# Patient Record
Sex: Female | Born: 1940 | Race: White | Hispanic: No | Marital: Single | Smoking: Former smoker
Health system: Southern US, Community
[De-identification: ages and names within clinical notes are randomized; demographics above are authoritative.]

## PROBLEM LIST (undated history)

## (undated) DIAGNOSIS — R112 Nausea with vomiting, unspecified: Secondary | ICD-10-CM

## (undated) DIAGNOSIS — E559 Vitamin D deficiency, unspecified: Secondary | ICD-10-CM

## (undated) DIAGNOSIS — K219 Gastro-esophageal reflux disease without esophagitis: Secondary | ICD-10-CM

## (undated) DIAGNOSIS — M1990 Unspecified osteoarthritis, unspecified site: Secondary | ICD-10-CM

## (undated) DIAGNOSIS — R002 Palpitations: Secondary | ICD-10-CM

## (undated) DIAGNOSIS — C801 Malignant (primary) neoplasm, unspecified: Secondary | ICD-10-CM

## (undated) DIAGNOSIS — Z8489 Family history of other specified conditions: Secondary | ICD-10-CM

## (undated) DIAGNOSIS — J45909 Unspecified asthma, uncomplicated: Secondary | ICD-10-CM

## (undated) DIAGNOSIS — K635 Polyp of colon: Secondary | ICD-10-CM

## (undated) DIAGNOSIS — M4800 Spinal stenosis, site unspecified: Secondary | ICD-10-CM

## (undated) DIAGNOSIS — F419 Anxiety disorder, unspecified: Secondary | ICD-10-CM

## (undated) DIAGNOSIS — T883XXA Malignant hyperthermia due to anesthesia, initial encounter: Secondary | ICD-10-CM

## (undated) DIAGNOSIS — Z9889 Other specified postprocedural states: Secondary | ICD-10-CM

## (undated) DIAGNOSIS — E039 Hypothyroidism, unspecified: Secondary | ICD-10-CM

## (undated) DIAGNOSIS — I10 Essential (primary) hypertension: Secondary | ICD-10-CM

## (undated) HISTORY — PX: COLONOSCOPY: SHX174

## (undated) HISTORY — DX: Spinal stenosis, site unspecified: M48.00

## (undated) HISTORY — PX: BREAST BIOPSY: SHX20

## (undated) HISTORY — PX: AUGMENTATION MAMMAPLASTY: SUR837

## (undated) HISTORY — DX: Polyp of colon: K63.5

## (undated) HISTORY — PX: OTHER SURGICAL HISTORY: SHX169

## (undated) HISTORY — DX: Unspecified asthma, uncomplicated: J45.909

## (undated) HISTORY — PX: CLAVICLE SURGERY: SHX598

## (undated) HISTORY — DX: Vitamin D deficiency, unspecified: E55.9

---

## 2016-04-08 ENCOUNTER — Other Ambulatory Visit: Payer: Self-pay | Admitting: Internal Medicine

## 2017-01-01 ENCOUNTER — Other Ambulatory Visit: Payer: Self-pay | Admitting: Internal Medicine

## 2017-01-01 DIAGNOSIS — Z1231 Encounter for screening mammogram for malignant neoplasm of breast: Secondary | ICD-10-CM

## 2017-01-15 ENCOUNTER — Ambulatory Visit: Payer: Self-pay

## 2017-02-10 ENCOUNTER — Ambulatory Visit
Admission: RE | Admit: 2017-02-10 | Discharge: 2017-02-10 | Disposition: A | Discharge status: Home / Self Care | Payer: BLUE CROSS/BLUE SHIELD | Source: Ambulatory Visit | Attending: Internal Medicine | Admitting: Internal Medicine

## 2017-02-10 ENCOUNTER — Ambulatory Visit: Payer: Self-pay

## 2017-02-10 DIAGNOSIS — Z1231 Encounter for screening mammogram for malignant neoplasm of breast: Secondary | ICD-10-CM

## 2017-07-25 DIAGNOSIS — M19012 Primary osteoarthritis, left shoulder: Secondary | ICD-10-CM | POA: Insufficient documentation

## 2017-11-17 NOTE — Patient Instructions (Addendum)
Emma Swanson  11/17/2017   Your procedure is scheduled on: 11/25/2017    Report to [facility] Main  Entrance  Report to admitting at    [phone]    Call this number if you have problems the morning of surgery [phone]   Remember: Do not eat food or drink liquids :After Midnight.     Take these medicines the morning of surgery with A SIP OF WATER: Nasal spray, Eye drops as usual,                                  You may not have any metal on your body including hair pins and              piercings  Do not wear jewelry, make-up, lotions, powders or perfumes, deodorant             Do not wear nail polish.  Do not shave  48 hours prior to surgery.              Do not bring valuables to the hospital. [place].  Contacts, dentures or bridgework may not be worn into surgery.  Leave suitcase in the car. After surgery it may be brought to your room.                     Please read over the following fact sheets you were given: _____________________________________________________________________             [facility] - Preparing for Surgery Before surgery, you can play an important role.  Because skin is not sterile, your skin needs to be as free of germs as possible.  You can reduce the number of germs on your skin by washing with CHG (chlorahexidine gluconate) soap before surgery.  CHG is an antiseptic cleaner which kills germs and bonds with the skin to continue killing germs even after washing. Please DO NOT use if you have an allergy to CHG or antibacterial soaps.  If your skin becomes reddened/irritated stop using the CHG and inform your nurse when you arrive at Short Stay. Do not shave (including legs and underarms) for at least 48 hours prior to the first CHG shower.  You may shave your face/neck. Please follow these instructions carefully:  1.  Shower with CHG Soap the night before surgery and the   morning of Surgery.  2.  If you choose to wash your hair, wash your hair first as usual with your  normal  shampoo.  3.  After you shampoo, rinse your hair and body thoroughly to remove the  shampoo.                           4.  Use CHG as you would any other liquid soap.  You can apply chg directly  to the skin and wash                       Gently with a scrungie or clean washcloth.  5.  Apply the CHG Soap to your body ONLY FROM THE NECK DOWN.   Do not use on face/ open  Wound or open sores. Avoid contact with eyes, ears mouth and genitals (private parts).                       Wash face,  Genitals (private parts) with your normal soap.             6.  Wash thoroughly, paying special attention to the area where your surgery  will be performed.  7.  Thoroughly rinse your body with warm water from the neck down.  8.  DO NOT shower/wash with your normal soap after using and rinsing off  the CHG Soap.                9.  Pat yourself dry with a clean towel.            10.  Wear clean pajamas.            11.  Place clean sheets on your bed the night of your first shower and do not  sleep with pets. Day of Surgery : Do not apply any lotions/deodorants the morning of surgery.  Please wear clean clothes to the hospital/surgery center.  FAILURE TO FOLLOW THESE INSTRUCTIONS MAY RESULT IN THE CANCELLATION OF YOUR SURGERY PATIENT SIGNATURE_________________________________  NURSE SIGNATURE__________________________________  ________________________________________________________________________  WHAT IS A BLOOD TRANSFUSION? Blood Transfusion Information  A transfusion is the replacement of blood or some of its parts. Blood is made up of multiple cells which provide different functions.  Red blood cells carry oxygen and are used for blood loss replacement.  White blood cells fight against infection.  Platelets control bleeding.  Plasma helps clot blood.  Other blood  products are available for specialized needs, such as hemophilia or other clotting disorders. BEFORE THE TRANSFUSION  Who gives blood for transfusions?   Healthy volunteers who are fully evaluated to make sure their blood is safe. This is blood bank blood. Transfusion therapy is the safest it has ever been in the practice of medicine. Before blood is taken from a donor, a complete history is taken to make sure that person has no history of diseases nor engages in risky social behavior (examples are intravenous drug use or sexual activity with multiple partners). The donor's travel history is screened to minimize risk of transmitting infections, such as malaria. The donated blood is tested for signs of infectious diseases, such as HIV and hepatitis. The blood is then tested to be sure it is compatible with you in order to minimize the chance of a transfusion reaction. If you or a relative donates blood, this is often done in anticipation of surgery and is not appropriate for emergency situations. It takes many days to process the donated blood. RISKS AND COMPLICATIONS Although transfusion therapy is very safe and saves many lives, the main dangers of transfusion include:   Getting an infectious disease.  Developing a transfusion reaction. This is an allergic reaction to something in the blood you were given. Every precaution is taken to prevent this. The decision to have a blood transfusion has been considered carefully by your caregiver before blood is given. Blood is not given unless the benefits outweigh the risks. AFTER THE TRANSFUSION  Right after receiving a blood transfusion, you will usually feel much better and more energetic. This is especially true if your red blood cells have gotten low (anemic). The transfusion raises the level of the red blood cells which carry oxygen, and this usually causes an energy increase.  The  nurse administering the transfusion will monitor you carefully for  complications. HOME CARE INSTRUCTIONS  No special instructions are needed after a transfusion. You may find your energy is better. Speak with your caregiver about any limitations on activity for underlying diseases you may have. SEEK MEDICAL CARE IF:   Your condition is not improving after your transfusion.  You develop redness or irritation at the intravenous (IV) site. SEEK IMMEDIATE MEDICAL CARE IF:  Any of the following symptoms occur over the next 12 hours:  Shaking chills.  You have a temperature by mouth above 102 F (38.9 C), not controlled by medicine.  Chest, back, or muscle pain.  People around you feel you are not acting correctly or are confused.  Shortness of breath or difficulty breathing.  Dizziness and fainting.  You get a rash or develop hives.  You have a decrease in urine output.  Your urine turns a dark color or changes to pink, red, or brown. Any of the following symptoms occur over the next 10 days:  You have a temperature by mouth above 102 F (38.9 C), not controlled by medicine.  Shortness of breath.  Weakness after normal activity.  The white part of the eye turns yellow (jaundice).  You have a decrease in the amount of urine or are urinating less often.  Your urine turns a dark color or changes to pink, red, or brown. Document Released: 07/05/2000 Document Revised: 09/30/2011 Document Reviewed: 02/22/2008 ExitCare Patient Information 2014 [place].  _______________________________________________________________________  Incentive Spirometer  An incentive spirometer is a tool that can help keep your lungs clear and active. This tool measures how well you are filling your lungs with each breath. Taking long deep breaths may help reverse or decrease the chance of developing breathing (pulmonary) problems (especially infection) following:  A long period of time when you are unable to move or be active. BEFORE THE PROCEDURE   If  the spirometer includes an indicator to show your best effort, your nurse or respiratory therapist will set it to a desired goal.  If possible, sit up straight or lean slightly forward. Try not to slouch.  Hold the incentive spirometer in an upright position. INSTRUCTIONS FOR USE  1. Sit on the edge of your bed if possible, or sit up as far as you can in bed or on a chair. 2. Hold the incentive spirometer in an upright position. 3. Breathe out normally. 4. Place the mouthpiece in your mouth and seal your lips tightly around it. 5. Breathe in slowly and as deeply as possible, raising the piston or the ball toward the top of the column. 6. Hold your breath for 3-5 seconds or for as long as possible. Allow the piston or ball to fall to the bottom of the column. 7. Remove the mouthpiece from your mouth and breathe out normally. 8. Rest for a few seconds and repeat Steps 1 through 7 at least 10 times every 1-2 hours when you are awake. Take your time and take a few normal breaths between deep breaths. 9. The spirometer may include an indicator to show your best effort. Use the indicator as a goal to work toward during each repetition. 10. After each set of 10 deep breaths, practice coughing to be sure your lungs are clear. If you have an incision (the cut made at the time of surgery), support your incision when coughing by placing a pillow or rolled up towels firmly against it. Once you are able to get  out of bed, walk around indoors and cough well. You may stop using the incentive spirometer when instructed by your caregiver.  RISKS AND COMPLICATIONS  Take your time so you do not get dizzy or light-headed.  If you are in pain, you may need to take or ask for pain medication before doing incentive spirometry. It is harder to take a deep breath if you are having pain. AFTER USE  Rest and breathe slowly and easily.  It can be helpful to keep track of a log of your progress. Your caregiver can  provide you with a simple table to help with this. If you are using the spirometer at home, follow these instructions: [place] IF:   You are having difficultly using the spirometer.  You have trouble using the spirometer as often as instructed.  Your pain medication is not giving enough relief while using the spirometer.  You develop fever of 100.5 F (38.1 C) or higher. SEEK IMMEDIATE MEDICAL CARE IF:   You cough up bloody sputum that had not been present before.  You develop fever of 102 F (38.9 C) or greater.  You develop worsening pain at or near the incision site. MAKE SURE YOU:   Understand these instructions.  Will watch your condition.  Will get help right away if you are not doing well or get worse. Document Released: 11/18/2006 Document Revised: 09/30/2011 Document Reviewed: 01/19/2007 [facility] Patient Information 2014 [place], [place].   ________________________________________________________________________

## 2017-11-18 ENCOUNTER — Encounter (HOSPITAL_COMMUNITY): Payer: Self-pay

## 2017-11-18 ENCOUNTER — Other Ambulatory Visit: Payer: Self-pay

## 2017-11-18 ENCOUNTER — Encounter (HOSPITAL_COMMUNITY)
Admission: RE | Admit: 2017-11-18 | Discharge: 2017-11-18 | Disposition: A | Discharge status: Home / Self Care | Payer: BLUE CROSS/BLUE SHIELD | Source: Ambulatory Visit | Attending: Orthopedic Surgery | Admitting: Orthopedic Surgery

## 2017-11-18 DIAGNOSIS — Z01818 Encounter for other preprocedural examination: Secondary | ICD-10-CM | POA: Diagnosis present

## 2017-11-18 DIAGNOSIS — R9431 Abnormal electrocardiogram [ECG] [EKG]: Secondary | ICD-10-CM | POA: Insufficient documentation

## 2017-11-18 DIAGNOSIS — M1711 Unilateral primary osteoarthritis, right knee: Secondary | ICD-10-CM | POA: Insufficient documentation

## 2017-11-18 HISTORY — DX: Unspecified osteoarthritis, unspecified site: M19.90

## 2017-11-18 HISTORY — DX: Nausea with vomiting, unspecified: R11.2

## 2017-11-18 HISTORY — DX: Malignant (primary) neoplasm, unspecified: C80.1

## 2017-11-18 HISTORY — DX: Gastro-esophageal reflux disease without esophagitis: K21.9

## 2017-11-18 HISTORY — DX: Essential (primary) hypertension: I10

## 2017-11-18 HISTORY — DX: Malignant hyperthermia due to anesthesia, initial encounter: T88.3XXA

## 2017-11-18 HISTORY — DX: Family history of other specified conditions: Z84.89

## 2017-11-18 HISTORY — DX: Other specified postprocedural states: Z98.890

## 2017-11-18 LAB — BASIC METABOLIC PANEL
ANION GAP: 10 (ref 5–15)
BUN: 16 mg/dL (ref 6–20)
CALCIUM: 9.3 mg/dL (ref 8.9–10.3)
CO2: 26 mmol/L (ref 22–32)
Chloride: 104 mmol/L (ref 101–111)
Creatinine, Ser: 0.77 mg/dL (ref 0.44–1.00)
GFR calc Af Amer: >60 mL/min (ref >60)
GLUCOSE: 91 mg/dL (ref 65–99)
POTASSIUM: 3.8 mmol/L (ref 3.5–5.1)
Sodium: 140 mmol/L (ref 135–145)

## 2017-11-18 LAB — CBC
HEMATOCRIT: 41.1 % (ref 36.0–46.0)
HEMOGLOBIN: 13.8 g/dL (ref 12.0–15.0)
MCH: 31.9 pg (ref 26.0–34.0)
MCHC: 33.6 g/dL (ref 30.0–36.0)
MCV: 94.9 fL (ref 78.0–100.0)
Platelets: 270 10*3/uL (ref 150–400)
RBC: 4.33 MIL/uL (ref 3.87–5.11)
RDW: 13.2 % (ref 11.5–15.5)
WBC: 5.6 10*3/uL (ref 4.0–10.5)

## 2017-11-18 LAB — SURGICAL PCR SCREEN
MRSA, PCR: NEGATIVE
STAPHYLOCOCCUS AUREUS: NEGATIVE

## 2017-11-18 LAB — ABO/RH: ABO/RH(D): O POS

## 2017-11-18 NOTE — Progress Notes (Signed)
Spoke with Dr Conrad Lecanto ( anesthesia)  Regarding family hx of malignant hyperthermia.  Patient had a cousin's son who almost died from malignant hyperthermia and patient's brother and his 2 sons who tested positive for  Malignant hyperthermia.  No new orders given.

## 2017-11-18 NOTE — H&P (Signed)
TOTAL KNEE ADMISSION H&P  Patient is being admitted for right total knee arthroplasty.  Subjective:  Chief Complaint:  Right knee primary OA / pain  HPI: Emma Swanson, 77 y.o. female, has a history of pain and functional disability in the right knee due to arthritis and has failed non-surgical conservative treatments for greater than 12 weeks to includeNSAID's and/or analgesics, corticosteriod injections, viscosupplementation injections and activity modification.  Onset of symptoms was gradual, starting >10 years ago with gradually worsening course since that time. The patient noted prior procedures on the knee to include  arthroscopy on the right knee(s).  Patient currently rates pain in the right knee(s) at 8 out of 10 with activity. Patient has night pain, worsening of pain with activity and weight bearing, pain that interferes with activities of daily living, pain with passive range of motion, crepitus and joint swelling.  Patient has evidence of periarticular osteophytes and joint space narrowing by imaging studies. There is no active infection.  Risks, benefits and expectations were discussed with the patient.  Risks including but not limited to the risk of anesthesia, blood clots, nerve damage, blood vessel damage, failure of the prosthesis, infection and up to and including death.  Patient understand the risks, benefits and expectations and wishes to proceed with surgery.   PCP: Sandi Mariscal, MD  D/C Plans:       Home  Post-op Meds:       No Rx given   Tranexamic Acid:      To be given - IV   Decadron:      is to be given  FYI:      ASA  Norco  DME:   Rx given for - RW   PT:   OPPT rx given    Past Medical History:  Diagnosis Date  . Arthritis   . Cancer ([place])    hx of skin cancers   . Family history of adverse reaction to anesthesia    see malignant hyperthermia tab   . GERD (gastroesophageal reflux disease)   . Hypertension   . Malignant hyperthermia    cousin's son- -  malignant hyperthermia al most died  ,  patient's brother and his 2 sons tested positive for malignant hyperthermia  . PONV (postoperative nausea and vomiting)     Past Surgical History:  Procedure Laterality Date  . AUGMENTATION MAMMAPLASTY Bilateral   . bilateral arthroscopic knee surgery     . bone spur removal     . left ankle surgery - had pins     . thyroid surgery - age 67       No current facility-administered medications for this encounter.    Current Outpatient Medications  Medication Sig Dispense Refill Last Dose  . CALCIUM PO Take 1 tablet by mouth daily.     . Cholecalciferol (VITAMIN D3) 5000 units CAPS Take 5,000 Units by mouth daily.     . clobetasol ointment (TEMOVATE) 0.25 % Apply 1 application topically at bedtime.  0   . GLUCOSAMINE-CHONDROITIN PO Take 2 tablets by mouth daily.     . hydrocortisone butyrate (LUCOID) 0.1 % CREA cream Apply 1 application topically daily as needed for rash.  0   . ipratropium (ATROVENT) 0.06 % nasal spray Place 4 sprays into both nostrils daily.     Marland Kitchen lisinopril-hydrochlorothiazide (PRINZIDE,ZESTORETIC) 10-12.5 MG tablet Take 1 tablet by mouth daily.     . Multiple Vitamin (MULTIVITAMIN WITH MINERALS) TABS tablet Take 1 tablet by mouth daily.     Marland Kitchen  naproxen sodium (ALEVE) 220 MG tablet Take 440 mg by mouth daily as needed (pain).     Marland Kitchen OVER THE COUNTER MEDICATION Apply 1 application topically daily as needed (joint pain). CBD cream     . PREVALITE 4 g packet Take 4 g by mouth 2 (two) times daily.  3   . Tetrahydrozoline HCl (VISINE OP) Place 1 drop into both eyes daily as needed (dry eyes).     . TURMERIC PO Take 1 tablet by mouth daily.     Marland Kitchen zolpidem (AMBIEN) 10 MG tablet Take 5 mg by mouth See admin instructions. Take 5 mg at bedtime and second 5 mg dose in the middle of the night      Allergies  Allergen Reactions  . Other     general anesthesia - pt's family has history of malignant hyperthermia  . Oxycodone     Hallucinations    . Penicillins     Childhood reaction Has patient had a PCN reaction causing immediate rash, facial/tongue/throat swelling, SOB or lightheadedness with hypotension: Unknown Has patient had a PCN reaction causing severe rash involving mucus membranes or skin necrosis: Unknown Has patient had a PCN reaction that required hospitalization: Unknown Has patient had a PCN reaction occurring within the last 10 years: No If all of the above answers are "NO", then may proceed with Cephalosporin use.     Social History   Tobacco Use  . Smoking status: Former Research scientist (life sciences)  . Smokeless tobacco: Never Used  . Tobacco comment: age of 47 until age 89   Substance Use Topics  . Alcohol use: Yes    Comment: 2 drinks per nite        Review of Systems  Constitutional: Negative.   HENT: Negative.   Eyes: Negative.   Respiratory: Negative.   Cardiovascular: Negative.   Gastrointestinal: Positive for diarrhea and heartburn.  Genitourinary: Negative.   Musculoskeletal: Positive for back pain and joint pain.  Skin: Negative.   Neurological: Negative.   Endo/Heme/Allergies: Negative.   Psychiatric/Behavioral: Negative.     Objective:  Physical Exam  Constitutional: She is oriented to person, place, and time. She appears well-developed.  HENT:  Head: Normocephalic.  Eyes: Pupils are equal, round, and reactive to light.  Neck: Neck supple. No JVD present. No tracheal deviation present. No thyromegaly present.  Cardiovascular: Normal rate, regular rhythm and intact distal pulses.  Respiratory: Effort normal and breath sounds normal. No respiratory distress. She has no wheezes.  GI: Soft. There is no tenderness. There is no guarding.  Musculoskeletal:       Right knee: She exhibits decreased range of motion, swelling and bony tenderness. She exhibits no ecchymosis, no deformity, no laceration and no erythema. Tenderness found.  Lymphadenopathy:    She has no cervical adenopathy.  Neurological: She is  alert and oriented to person, place, and time.  Skin: Skin is warm and dry.  Psychiatric: She has a normal mood and affect.    Vital signs in last 24 hours: Temp:  [97.6 F (36.4 C)] 97.6 F (36.4 C) (04/30 1100) Pulse Rate:  [73] 73 (04/30 1100) Resp:  [16] 16 (04/30 1100) BP: (125)/(76) 125/76 (04/30 1100) SpO2:  [100 %] 100 % (04/30 1100) Weight:  [63 kg (139 lb)] 63 kg (139 lb) (04/30 1100)  Labs:   Estimated body mass index is 22.61 kg/m as calculated from the following:   Height as of 11/18/17: 5' 5.75" (1.67 m).   Weight as of 11/18/17: 63  kg (139 lb).   Imaging Review Plain radiographs demonstrate severe degenerative joint disease of the right knee. The bone quality appears to be good for age and reported activity level.   Preoperative templating of the joint replacement has been completed, documented, and submitted to the Operating Room personnel in order to optimize intra-operative equipment management.    Patient's anticipated LOS is less than 2 midnights, meeting these requirements: - Lives within 1 hour of care - Has a competent adult at home to recover with post-op recover - NO history of  - Chronic pain requiring opiods  - Diabetes  - Coronary Artery Disease  - Heart failure  - Heart attack  - Stroke  - DVT/VTE  - Cardiac arrhythmia  - Respiratory Failure/COPD  - Renal failure  - Anemia  - Advanced Liver disease        Assessment/Plan:  End stage arthritis, right knee   The patient history, physical examination, clinical judgment of the provider and imaging studies are consistent with end stage degenerative joint disease of the right knee(s) and total knee arthroplasty is deemed medically necessary. The treatment options including medical management, injection therapy arthroscopy and arthroplasty were discussed at length. The risks and benefits of total knee arthroplasty were presented and reviewed. The risks due to aseptic loosening, infection,  stiffness, patella tracking problems, thromboembolic complications and other imponderables were discussed. The patient acknowledged the explanation, agreed to proceed with the plan and consent was signed. Patient is being admitted for inpatient treatment for surgery, pain control, PT, OT, prophylactic antibiotics, VTE prophylaxis, progressive ambulation and ADL's and discharge planning. The patient is planning to be discharged home.    [place] Pugh Brinsley Wence   PA-C  11/18/2017, 3:23 PM

## 2017-11-18 NOTE — Progress Notes (Signed)
Final EKG done 11/18/17-epic

## 2017-11-24 MED ORDER — SODIUM CHLORIDE 0.9 % IV SOLN
1000.0000 mg | INTRAVENOUS | Status: AC
  Administered 2017-11-25: 1000 mg via INTRAVENOUS
  Filled 2017-11-24: qty 1100

## 2017-11-25 ENCOUNTER — Observation Stay (HOSPITAL_COMMUNITY)
Admission: RE | Admit: 2017-11-25 | Discharge: 2017-11-26 | Disposition: A | Discharge status: Home / Self Care | Payer: BLUE CROSS/BLUE SHIELD | Source: Ambulatory Visit | Attending: Orthopedic Surgery | Admitting: Orthopedic Surgery

## 2017-11-25 ENCOUNTER — Encounter (HOSPITAL_COMMUNITY)
Admission: RE | Disposition: A | Discharge status: Home / Self Care | Payer: Self-pay | Source: Ambulatory Visit | Attending: Orthopedic Surgery

## 2017-11-25 ENCOUNTER — Encounter (HOSPITAL_COMMUNITY): Payer: Self-pay | Admitting: Emergency Medicine

## 2017-11-25 ENCOUNTER — Inpatient Hospital Stay (HOSPITAL_COMMUNITY): Payer: BLUE CROSS/BLUE SHIELD | Admitting: Anesthesiology

## 2017-11-25 ENCOUNTER — Other Ambulatory Visit: Payer: Self-pay

## 2017-11-25 DIAGNOSIS — I10 Essential (primary) hypertension: Secondary | ICD-10-CM | POA: Diagnosis not present

## 2017-11-25 DIAGNOSIS — Z87891 Personal history of nicotine dependence: Secondary | ICD-10-CM | POA: Insufficient documentation

## 2017-11-25 DIAGNOSIS — M659 Synovitis and tenosynovitis, unspecified: Secondary | ICD-10-CM | POA: Diagnosis not present

## 2017-11-25 DIAGNOSIS — Z79899 Other long term (current) drug therapy: Secondary | ICD-10-CM | POA: Diagnosis not present

## 2017-11-25 DIAGNOSIS — M1711 Unilateral primary osteoarthritis, right knee: Secondary | ICD-10-CM | POA: Diagnosis present

## 2017-11-25 DIAGNOSIS — Z96651 Presence of right artificial knee joint: Secondary | ICD-10-CM

## 2017-11-25 DIAGNOSIS — M25761 Osteophyte, right knee: Secondary | ICD-10-CM | POA: Insufficient documentation

## 2017-11-25 DIAGNOSIS — Z85828 Personal history of other malignant neoplasm of skin: Secondary | ICD-10-CM | POA: Diagnosis not present

## 2017-11-25 DIAGNOSIS — Z96659 Presence of unspecified artificial knee joint: Secondary | ICD-10-CM

## 2017-11-25 HISTORY — PX: TOTAL KNEE ARTHROPLASTY: SHX125

## 2017-11-25 LAB — TYPE AND SCREEN
ABO/RH(D): O POS
Antibody Screen: NEGATIVE

## 2017-11-25 LAB — GLUCOSE, CAPILLARY: Glucose-Capillary: 146 mg/dL — ABNORMAL HIGH (ref 65–99)

## 2017-11-25 SURGERY — ARTHROPLASTY, KNEE, TOTAL
Anesthesia: Spinal | Site: Knee | Laterality: Right

## 2017-11-25 MED ORDER — ASPIRIN 81 MG PO CHEW
81.0000 mg | CHEWABLE_TABLET | Freq: Two times a day (BID) | ORAL | Status: DC
  Administered 2017-11-25 – 2017-11-26 (×2): 81 mg via ORAL
  Filled 2017-11-25 (×2): qty 1

## 2017-11-25 MED ORDER — HYDROCODONE-ACETAMINOPHEN 7.5-325 MG PO TABS: 1.0000 | ORAL_TABLET | ORAL | 0 refills | Status: DC | PRN

## 2017-11-25 MED ORDER — METHOCARBAMOL 500 MG PO TABS: 500.0000 mg | ORAL_TABLET | Freq: Four times a day (QID) | ORAL | 0 refills | Status: DC | PRN

## 2017-11-25 MED ORDER — CHLORHEXIDINE GLUCONATE 4 % EX LIQD: 60.0000 mL | Freq: Once | CUTANEOUS | Status: DC

## 2017-11-25 MED ORDER — SODIUM CHLORIDE 0.9 % IJ SOLN
INTRAMUSCULAR | Status: DC | PRN
  Administered 2017-11-25: 30 mL

## 2017-11-25 MED ORDER — HYDROMORPHONE HCL 1 MG/ML IJ SOLN
INTRAMUSCULAR | Status: AC
  Filled 2017-11-25: qty 1

## 2017-11-25 MED ORDER — LIDOCAINE 2% (20 MG/ML) 5 ML SYRINGE
INTRAMUSCULAR | Status: AC
  Filled 2017-11-25: qty 5

## 2017-11-25 MED ORDER — ALUM & MAG HYDROXIDE-SIMETH 200-200-20 MG/5ML PO SUSP: 15.0000 mL | ORAL | Status: DC | PRN

## 2017-11-25 MED ORDER — PROPOFOL 10 MG/ML IV BOLUS
INTRAVENOUS | Status: DC | PRN
  Administered 2017-11-25: 20 mg via INTRAVENOUS
  Administered 2017-11-25: 150 mg via INTRAVENOUS
  Administered 2017-11-25: 30 mg via INTRAVENOUS

## 2017-11-25 MED ORDER — FENTANYL CITRATE (PF) 100 MCG/2ML IJ SOLN
INTRAMUSCULAR | Status: AC
  Filled 2017-11-25: qty 2

## 2017-11-25 MED ORDER — 0.9 % SODIUM CHLORIDE (POUR BTL) OPTIME
TOPICAL | Status: DC | PRN
  Administered 2017-11-25: 1000 mL

## 2017-11-25 MED ORDER — METHOCARBAMOL 1000 MG/10ML IJ SOLN
500.0000 mg | Freq: Four times a day (QID) | INTRAVENOUS | Status: DC | PRN
  Administered 2017-11-25: 500 mg via INTRAVENOUS
  Filled 2017-11-25: qty 550

## 2017-11-25 MED ORDER — PROMETHAZINE HCL 25 MG/ML IJ SOLN
INTRAMUSCULAR | Status: AC
  Filled 2017-11-25: qty 1

## 2017-11-25 MED ORDER — BUPIVACAINE HCL (PF) 0.25 % IJ SOLN
INTRAMUSCULAR | Status: DC | PRN
  Administered 2017-11-25: 30 mL

## 2017-11-25 MED ORDER — CEFAZOLIN SODIUM-DEXTROSE 2-4 GM/100ML-% IV SOLN
2.0000 g | INTRAVENOUS | Status: AC
  Administered 2017-11-25: 2 g via INTRAVENOUS
  Filled 2017-11-25: qty 100

## 2017-11-25 MED ORDER — MIDAZOLAM HCL 2 MG/2ML IJ SOLN
INTRAMUSCULAR | Status: AC
  Filled 2017-11-25: qty 2

## 2017-11-25 MED ORDER — PROPOFOL 500 MG/50ML IV EMUL
INTRAVENOUS | Status: DC | PRN
  Administered 2017-11-25: 125 ug/kg/min via INTRAVENOUS

## 2017-11-25 MED ORDER — ZOLPIDEM TARTRATE 5 MG PO TABS
5.0000 mg | ORAL_TABLET | Freq: Every day | ORAL | Status: DC
  Filled 2017-11-25: qty 1

## 2017-11-25 MED ORDER — DOCUSATE SODIUM 100 MG PO CAPS
100.0000 mg | ORAL_CAPSULE | Freq: Two times a day (BID) | ORAL | Status: DC
  Filled 2017-11-25: qty 1

## 2017-11-25 MED ORDER — FENTANYL CITRATE (PF) 100 MCG/2ML IJ SOLN
50.0000 ug | INTRAMUSCULAR | Status: DC
  Administered 2017-11-25 (×2): 50 ug via INTRAVENOUS
  Filled 2017-11-25: qty 2

## 2017-11-25 MED ORDER — ONDANSETRON HCL 4 MG/2ML IJ SOLN
INTRAMUSCULAR | Status: DC | PRN
  Administered 2017-11-25: 4 mg via INTRAVENOUS

## 2017-11-25 MED ORDER — DOCUSATE SODIUM 100 MG PO CAPS: 100.0000 mg | ORAL_CAPSULE | Freq: Two times a day (BID) | ORAL | 0 refills | Status: DC

## 2017-11-25 MED ORDER — DEXAMETHASONE SODIUM PHOSPHATE 10 MG/ML IJ SOLN
10.0000 mg | Freq: Once | INTRAMUSCULAR | Status: AC
  Administered 2017-11-25: 10 mg via INTRAVENOUS

## 2017-11-25 MED ORDER — POLYETHYLENE GLYCOL 3350 17 G PO PACK: 17.0000 g | PACK | Freq: Two times a day (BID) | ORAL | 0 refills | Status: DC

## 2017-11-25 MED ORDER — ASPIRIN 81 MG PO CHEW: 81.0000 mg | CHEWABLE_TABLET | Freq: Two times a day (BID) | ORAL | 0 refills | Status: AC

## 2017-11-25 MED ORDER — LACTATED RINGERS IV SOLN
INTRAVENOUS | Status: DC
  Administered 2017-11-25 (×2): via INTRAVENOUS

## 2017-11-25 MED ORDER — HYDROMORPHONE HCL 1 MG/ML IJ SOLN
0.2500 mg | INTRAMUSCULAR | Status: DC | PRN
  Administered 2017-11-25 (×4): 0.5 mg via INTRAVENOUS

## 2017-11-25 MED ORDER — MAGNESIUM CITRATE PO SOLN: 1.0000 | Freq: Once | ORAL | Status: DC | PRN

## 2017-11-25 MED ORDER — KETOROLAC TROMETHAMINE 30 MG/ML IJ SOLN
INTRAMUSCULAR | Status: DC | PRN
  Administered 2017-11-25: 30 mg

## 2017-11-25 MED ORDER — ONDANSETRON HCL 4 MG PO TABS: 4.0000 mg | ORAL_TABLET | Freq: Four times a day (QID) | ORAL | Status: DC | PRN

## 2017-11-25 MED ORDER — ACETAMINOPHEN 325 MG PO TABS: 325.0000 mg | ORAL_TABLET | Freq: Four times a day (QID) | ORAL | Status: DC | PRN

## 2017-11-25 MED ORDER — IPRATROPIUM BROMIDE 0.06 % NA SOLN
4.0000 | Freq: Every day | NASAL | Status: DC
  Filled 2017-11-25: qty 15

## 2017-11-25 MED ORDER — PHENOL 1.4 % MT LIQD: 1.0000 | OROMUCOSAL | Status: DC | PRN

## 2017-11-25 MED ORDER — METHOCARBAMOL 500 MG PO TABS
500.0000 mg | ORAL_TABLET | Freq: Four times a day (QID) | ORAL | Status: DC | PRN
  Administered 2017-11-25 – 2017-11-26 (×2): 500 mg via ORAL
  Filled 2017-11-25 (×2): qty 1

## 2017-11-25 MED ORDER — ONDANSETRON HCL 4 MG/2ML IJ SOLN
INTRAMUSCULAR | Status: AC
  Filled 2017-11-25: qty 2

## 2017-11-25 MED ORDER — POLYETHYLENE GLYCOL 3350 17 G PO PACK: 17.0000 g | PACK | Freq: Two times a day (BID) | ORAL | Status: DC

## 2017-11-25 MED ORDER — PROPOFOL 10 MG/ML IV BOLUS
INTRAVENOUS | Status: AC
  Filled 2017-11-25: qty 40

## 2017-11-25 MED ORDER — MORPHINE SULFATE (PF) 2 MG/ML IV SOLN
0.5000 mg | INTRAVENOUS | Status: DC | PRN
  Administered 2017-11-25: 1 mg via INTRAVENOUS
  Filled 2017-11-25: qty 1

## 2017-11-25 MED ORDER — SODIUM CHLORIDE 0.9 % IV SOLN
INTRAVENOUS | Status: DC
  Administered 2017-11-25: 16:00:00 via INTRAVENOUS

## 2017-11-25 MED ORDER — FERROUS SULFATE 325 (65 FE) MG PO TABS: 325.0000 mg | ORAL_TABLET | Freq: Three times a day (TID) | ORAL | 3 refills | Status: DC

## 2017-11-25 MED ORDER — PROMETHAZINE HCL 25 MG/ML IJ SOLN
6.2500 mg | INTRAMUSCULAR | Status: DC | PRN
  Administered 2017-11-25: 6.25 mg via INTRAVENOUS

## 2017-11-25 MED ORDER — PROPOFOL 10 MG/ML IV BOLUS
INTRAVENOUS | Status: AC
  Filled 2017-11-25: qty 20

## 2017-11-25 MED ORDER — SODIUM CHLORIDE 0.9 % IJ SOLN
INTRAMUSCULAR | Status: AC
  Filled 2017-11-25: qty 50

## 2017-11-25 MED ORDER — MENTHOL 3 MG MT LOZG: 1.0000 | LOZENGE | OROMUCOSAL | Status: DC | PRN

## 2017-11-25 MED ORDER — METOCLOPRAMIDE HCL 5 MG PO TABS: 5.0000 mg | ORAL_TABLET | Freq: Three times a day (TID) | ORAL | Status: DC | PRN

## 2017-11-25 MED ORDER — HYDROCODONE-ACETAMINOPHEN 5-325 MG PO TABS
1.0000 | ORAL_TABLET | ORAL | Status: DC | PRN
  Administered 2017-11-26: 2 via ORAL
  Filled 2017-11-25: qty 2

## 2017-11-25 MED ORDER — CEFAZOLIN SODIUM-DEXTROSE 2-4 GM/100ML-% IV SOLN
2.0000 g | Freq: Four times a day (QID) | INTRAVENOUS | Status: AC
  Administered 2017-11-25 (×2): 2 g via INTRAVENOUS
  Filled 2017-11-25 (×2): qty 100

## 2017-11-25 MED ORDER — LIDOCAINE 2% (20 MG/ML) 5 ML SYRINGE
INTRAMUSCULAR | Status: DC | PRN
  Administered 2017-11-25: 100 mg via INTRAVENOUS

## 2017-11-25 MED ORDER — METOCLOPRAMIDE HCL 5 MG/ML IJ SOLN: 5.0000 mg | Freq: Three times a day (TID) | INTRAMUSCULAR | Status: DC | PRN

## 2017-11-25 MED ORDER — DIPHENHYDRAMINE HCL 12.5 MG/5ML PO ELIX: 12.5000 mg | ORAL_SOLUTION | ORAL | Status: DC | PRN

## 2017-11-25 MED ORDER — MIDAZOLAM HCL 2 MG/2ML IJ SOLN
1.0000 mg | INTRAMUSCULAR | Status: DC
  Administered 2017-11-25 (×2): 1 mg via INTRAVENOUS
  Filled 2017-11-25: qty 2

## 2017-11-25 MED ORDER — HYDROCODONE-ACETAMINOPHEN 7.5-325 MG PO TABS
1.0000 | ORAL_TABLET | ORAL | Status: DC | PRN
  Administered 2017-11-25: 1 via ORAL
  Administered 2017-11-26: 2 via ORAL
  Filled 2017-11-25: qty 2
  Filled 2017-11-25: qty 1

## 2017-11-25 MED ORDER — FENTANYL CITRATE (PF) 100 MCG/2ML IJ SOLN
INTRAMUSCULAR | Status: DC | PRN
  Administered 2017-11-25 (×3): 50 ug via INTRAVENOUS

## 2017-11-25 MED ORDER — ONDANSETRON HCL 4 MG/2ML IJ SOLN: 4.0000 mg | Freq: Four times a day (QID) | INTRAMUSCULAR | Status: DC | PRN

## 2017-11-25 MED ORDER — STERILE WATER FOR IRRIGATION IR SOLN
Status: DC | PRN
  Administered 2017-11-25: 2000 mL

## 2017-11-25 MED ORDER — FERROUS SULFATE 325 (65 FE) MG PO TABS: 325.0000 mg | ORAL_TABLET | Freq: Three times a day (TID) | ORAL | Status: DC

## 2017-11-25 MED ORDER — SODIUM CHLORIDE 0.9 % IR SOLN
Status: DC | PRN
  Administered 2017-11-25: 1000 mL

## 2017-11-25 MED ORDER — BISACODYL 10 MG RE SUPP: 10.0000 mg | Freq: Every day | RECTAL | Status: DC | PRN

## 2017-11-25 MED ORDER — DEXAMETHASONE SODIUM PHOSPHATE 10 MG/ML IJ SOLN
10.0000 mg | Freq: Once | INTRAMUSCULAR | Status: AC
  Administered 2017-11-26: 10 mg via INTRAVENOUS
  Filled 2017-11-25: qty 1

## 2017-11-25 MED ORDER — BUPIVACAINE HCL (PF) 0.25 % IJ SOLN
INTRAMUSCULAR | Status: AC
  Filled 2017-11-25: qty 30

## 2017-11-25 MED ORDER — DEXAMETHASONE SODIUM PHOSPHATE 10 MG/ML IJ SOLN
INTRAMUSCULAR | Status: AC
  Filled 2017-11-25: qty 1

## 2017-11-25 MED ORDER — ZOLPIDEM TARTRATE 5 MG PO TABS
5.0000 mg | ORAL_TABLET | Freq: Every evening | ORAL | Status: DC | PRN
  Administered 2017-11-25: 5 mg via ORAL

## 2017-11-25 MED ORDER — ROPIVACAINE HCL 7.5 MG/ML IJ SOLN
INTRAMUSCULAR | Status: DC | PRN
  Administered 2017-11-25: 20 mL via PERINEURAL

## 2017-11-25 MED ORDER — CHOLESTYRAMINE LIGHT 4 G PO PACK
4.0000 g | PACK | Freq: Two times a day (BID) | ORAL | Status: DC
  Administered 2017-11-25 – 2017-11-26 (×2): 4 g via ORAL
  Filled 2017-11-25 (×2): qty 1

## 2017-11-25 MED ORDER — KETOROLAC TROMETHAMINE 30 MG/ML IJ SOLN
INTRAMUSCULAR | Status: AC
  Filled 2017-11-25: qty 1

## 2017-11-25 MED ORDER — CELECOXIB 200 MG PO CAPS
200.0000 mg | ORAL_CAPSULE | Freq: Two times a day (BID) | ORAL | Status: DC
  Administered 2017-11-25 – 2017-11-26 (×2): 200 mg via ORAL
  Filled 2017-11-25 (×3): qty 1

## 2017-11-25 MED ORDER — TRANEXAMIC ACID 1000 MG/10ML IV SOLN
1000.0000 mg | Freq: Once | INTRAVENOUS | Status: AC
  Administered 2017-11-25: 1000 mg via INTRAVENOUS
  Filled 2017-11-25: qty 1100

## 2017-11-25 MED ORDER — MIDAZOLAM HCL 5 MG/5ML IJ SOLN
INTRAMUSCULAR | Status: DC | PRN
  Administered 2017-11-25: 2 mg via INTRAVENOUS

## 2017-11-25 SURGICAL SUPPLY — 55 items
BAG DECANTER FOR FLEXI CONT (MISCELLANEOUS) IMPLANT
BAG ZIPLOCK 12X15 (MISCELLANEOUS) IMPLANT
BANDAGE ACE 6X5 VEL STRL LF (GAUZE/BANDAGES/DRESSINGS) ×2 IMPLANT
BLADE SAW SGTL 11.0X1.19X90.0M (BLADE) ×2 IMPLANT
BLADE SAW SGTL 13.0X1.19X90.0M (BLADE) ×2 IMPLANT
BOWL SMART MIX CTS (DISPOSABLE) ×2 IMPLANT
CAPT KNEE TOTAL 3 ATTUNE ×2 IMPLANT
CEMENT HV SMART SET (Cement) ×4 IMPLANT
COVER SURGICAL LIGHT HANDLE (MISCELLANEOUS) ×2 IMPLANT
CUFF TOURN SGL QUICK 34 (TOURNIQUET CUFF) ×1
CUFF TRNQT CYL 34X4X40X1 (TOURNIQUET CUFF) ×1 IMPLANT
DECANTER SPIKE VIAL GLASS SM (MISCELLANEOUS) ×2 IMPLANT
DERMABOND ADVANCED (GAUZE/BANDAGES/DRESSINGS) ×1
DERMABOND ADVANCED .7 DNX12 (GAUZE/BANDAGES/DRESSINGS) ×1 IMPLANT
DRAPE TOP 10253 STERILE (DRAPES) IMPLANT
DRAPE U-SHAPE 47X51 STRL (DRAPES) ×2 IMPLANT
DRESSING AQUACEL AG SP 3.5X10 (GAUZE/BANDAGES/DRESSINGS) ×1 IMPLANT
DRSG AQUACEL AG SP 3.5X10 (GAUZE/BANDAGES/DRESSINGS) ×2
DURAPREP 26ML APPLICATOR (WOUND CARE) ×4 IMPLANT
ELECT REM PT RETURN 15FT ADLT (MISCELLANEOUS) ×2 IMPLANT
GLOVE BIOGEL M 7.0 STRL (GLOVE) IMPLANT
GLOVE BIOGEL M STRL SZ7.5 (GLOVE) ×2 IMPLANT
GLOVE BIOGEL PI IND STRL 7.0 (GLOVE) ×1 IMPLANT
GLOVE BIOGEL PI IND STRL 7.5 (GLOVE) ×5 IMPLANT
GLOVE BIOGEL PI IND STRL 8.5 (GLOVE) IMPLANT
GLOVE BIOGEL PI IND STRL 9 (GLOVE) ×2 IMPLANT
GLOVE BIOGEL PI INDICATOR 7.0 (GLOVE) ×1
GLOVE BIOGEL PI INDICATOR 7.5 (GLOVE) ×5
GLOVE BIOGEL PI INDICATOR 8.5 (GLOVE)
GLOVE BIOGEL PI INDICATOR 9 (GLOVE) ×2
GLOVE ECLIPSE 8.0 STRL XLNG CF (GLOVE) IMPLANT
GLOVE ECLIPSE 8.5 STRL (GLOVE) ×2 IMPLANT
GLOVE ORTHO TXT STRL SZ7.5 (GLOVE) ×4 IMPLANT
GOWN STRL REUS W/TWL 2XL LVL3 (GOWN DISPOSABLE) ×2 IMPLANT
GOWN STRL REUS W/TWL LRG LVL3 (GOWN DISPOSABLE) ×2 IMPLANT
GOWN STRL REUS W/TWL XL LVL4 (GOWN DISPOSABLE) ×6 IMPLANT
HANDPIECE INTERPULSE COAX TIP (DISPOSABLE) ×1
MANIFOLD NEPTUNE II (INSTRUMENTS) ×2 IMPLANT
PACK TOTAL KNEE CUSTOM (KITS) ×2 IMPLANT
POSITIONER SURGICAL ARM (MISCELLANEOUS) ×2 IMPLANT
SET HNDPC FAN SPRY TIP SCT (DISPOSABLE) ×1 IMPLANT
SET PAD KNEE POSITIONER (MISCELLANEOUS) ×2 IMPLANT
SUT MNCRL AB 4-0 PS2 18 (SUTURE) ×2 IMPLANT
SUT STRATAFIX 0 PDS 27 VIOLET (SUTURE) ×2
SUT STRATAFIX PDS+ 0 24IN (SUTURE) IMPLANT
SUT VIC AB 1 CT1 36 (SUTURE) ×2 IMPLANT
SUT VIC AB 2-0 CT1 27 (SUTURE) ×3
SUT VIC AB 2-0 CT1 TAPERPNT 27 (SUTURE) ×3 IMPLANT
SUTURE STRATFX 0 PDS 27 VIOLET (SUTURE) ×1 IMPLANT
SYR 50ML LL SCALE MARK (SYRINGE) IMPLANT
TRAY FOLEY CATH 14FRSI W/METER (CATHETERS) ×2 IMPLANT
TRAY FOLEY W/METER SILVER 16FR (SET/KITS/TRAYS/PACK) IMPLANT
WATER STERILE IRR 1000ML POUR (IV SOLUTION) ×2 IMPLANT
WRAP KNEE MAXI GEL POST OP (GAUZE/BANDAGES/DRESSINGS) ×2 IMPLANT
YANKAUER SUCT BULB TIP 10FT TU (MISCELLANEOUS) ×2 IMPLANT

## 2017-11-25 NOTE — Progress Notes (Signed)
Dr. Kalman Shan notified of plastic bracelet on R wrist that cannot be removed.

## 2017-11-25 NOTE — Discharge Instructions (Signed)

## 2017-11-25 NOTE — Transfer of Care (Signed)
Immediate Anesthesia Transfer of Care Note  Patient: Emma Swanson  Procedure(s) Performed: RIGHT TOTAL KNEE ARTHROPLASTY (Right Knee)  Patient Location: PACU  Anesthesia Type:General and Regional  Level of Consciousness: sedated  Airway & Oxygen Therapy: Patient Spontanous Breathing and Patient connected to face mask oxygen  Post-op Assessment: Report given to RN and Post -op Vital signs reviewed and stable  Post vital signs: Reviewed and stable  Last Vitals:  Vitals Value Taken Time  BP 120/76 11/25/2017 10:22 AM  Temp    Pulse 65 11/25/2017 10:22 AM  Resp 12 11/25/2017 10:22 AM  SpO2 100 % 11/25/2017 10:22 AM  Vitals shown include unvalidated device data.  Last Pain:  Vitals:   11/25/17 0655  TempSrc:   PainSc: 0-No pain      Patients Stated Pain Goal: 4 (62/37/62 [id number])  Complications: No apparent anesthesia complications

## 2017-11-25 NOTE — Anesthesia Procedure Notes (Signed)
Anesthesia Procedure Image    

## 2017-11-25 NOTE — Op Note (Signed)
NAME:  Emma Swanson                      MEDICAL RECORD NO.:  [id number]                             FACILITY:  [facility]      PHYSICIAN:  Pietro Cassis. Alvan Dame, M.D.  DATE OF BIRTH:  22-Jun-1941      DATE OF PROCEDURE:  11/25/2017                                     OPERATIVE REPORT         PREOPERATIVE DIAGNOSIS:  Right knee osteoarthritis.      POSTOPERATIVE DIAGNOSIS:  Right knee osteoarthritis.      FINDINGS:  The patient was noted to have complete loss of cartilage and   bone-on-bone arthritis with associated osteophytes in the lateral and patellofemoral compartments of   the knee with a significant synovitis and associated effusion.      PROCEDURE:  Right total knee replacement.      COMPONENTS USED:  DePuy Attune rotating platform posterior stabilized knee   system, a size 5N femur, 4 tibia, size 6 mm PS AOX insert, and 35 anatomic patellar   button.      SURGEON:  Pietro Cassis. Alvan Dame, M.D.      ASSISTANT:  Nehemiah Massed, PA-C.      ANESTHESIA:  General and Regional.      SPECIMENS:  None.      COMPLICATION:  None.      DRAINS:  None.  EBL: <100cc      TOURNIQUET TIME:   Total Tourniquet Time Documented: Thigh (Right) - 32 minutes Total: Thigh (Right) - 32 minutes  .      The patient was stable to the recovery room.      INDICATION FOR PROCEDURE:  Emma Swanson is a 77 y.o. female patient of   mine.  The patient had been seen, evaluated, and treated conservatively in the   office with medication, activity modification, and injections.  The patient had   radiographic changes of bone-on-bone arthritis with endplate sclerosis and osteophytes noted.      The patient failed conservative measures including medication, injections, and activity modification, and at this point was ready for more definitive measures.   Based on the radiographic changes and failed conservative measures, the patient   decided to proceed with total knee replacement.  Risks of infection,   DVT,  component failure, need for revision surgery, postop course, and   expectations were all   discussed and reviewed.  Consent was obtained for benefit of pain   relief.      PROCEDURE IN DETAIL:  The patient was brought to the operative theater.   Once adequate anesthesia, preoperative antibiotics, 2 gm of Ancef, 1 gm of Tranexamic Acid, and 10 mg of Decadron administered, the patient was positioned supine with the right thigh tourniquet placed.  The  right lower extremity was prepped and draped in sterile fashion.  A time-   out was performed identifying the patient, planned procedure, and   extremity.      The right lower extremity was placed in the [facility] leg holder.  The leg was   exsanguinated, tourniquet elevated to 250 mmHg.  A midline incision was   made  followed by median parapatellar arthrotomy.  Following initial   exposure, attention was first directed to the patella.  Precut   measurement was noted to be 22 mm.  I resected down to 14 mm and used a   35 anatromic patellar button to restore patellar height as well as cover the cut   surface.      The lug holes were drilled and a metal shim was placed to protect the   patella from retractors and saw blades.      At this point, attention was now directed to the femur.  The femoral   canal was opened with a drill, irrigated to try to prevent fat emboli.  An   intramedullary rod was passed at 3 degrees valgus, 9 mm of bone was   resected off the distal femur.  Following this resection, the tibia was   subluxated anteriorly.  Using the extramedullary guide, 4 mm of bone was resected off   the proximal lateral tibia.  We confirmed the gap would be   stable medially and laterally with a 10 mm insert as well as confirmed   the cut was perpendicular in the coronal plane, checking with an alignment rod.      Once this was done, I sized the femur to be a size 5 in the anterior-   posterior dimension, chose a narrow component based on  medial and   lateral dimension.  The size 5 rotation block was then pinned in   position anterior referenced using the C-clamp to set rotation.  The   anterior, posterior, and  chamfer cuts were made without difficulty nor   notching making certain that I was along the anterior cortex to help   with flexion gap stability.      The final box cut was made off the lateral aspect of distal femur.      At this point, the tibia was sized to be a size 4, the size 4 tray was   then pinned in position through the medial third of the tubercle,   drilled, and keel punched.  Trial reduction was now carried with a 5 femur,  4 tibia, a size 6 mm PS insert, and the 35 anatomic patella botton.  The knee was brought to   extension, full extension with good flexion stability with the patella   tracking through the trochlea without application of pressure.  Given   all these findings the femoral lug holes were drilled and then the trial components removed.  Final components were   opened and cement was mixed.  The knee was irrigated with normal saline   solution and pulse lavage.  The synovial lining was   then injected with 30 cc of 0.25% Marcaine with epinephrine and 1 cc of Toradol plus 30 cc of NS for a total of 61 cc.      The knee was irrigated.  Final implants were then cemented onto clean and   dried cut surfaces of bone with the knee brought to extension with a size 6 mm PS trial insert.      Once the cement had fully cured, the excess cement was removed   throughout the knee.  I confirmed I was satisfied with the range of   motion and stability, and the final size 6 mm PS AOXinsert was chosen.  It was   placed into the knee.      The tourniquet had been let down at 32 minutes.  No significant   hemostasis required.  The   extensor mechanism was then reapproximated using #1 Vicryl and #0 Stratafix sutures with the knee   in flexion.  The   remaining wound was closed with 2-0 Vicryl and running  4-0 Monocryl.   The knee was cleaned, dried, dressed sterilely using Dermabond and   Aquacel dressing.  The patient was then   brought to recovery room in stable condition, tolerating the procedure   well.   Please note that Physician Assistant, Nehemiah Massed, PA-C, was present for the entirety of the case, and was utilized for pre-operative positioning, peri-operative retractor management, general facilitation of the procedure.  He was also utilized for primary wound closure at the end of the case.              Pietro Cassis Alvan Dame, M.D.    11/25/2017 9:54 AM

## 2017-11-25 NOTE — Anesthesia Postprocedure Evaluation (Signed)
Anesthesia Post Note  Patient: Emma Swanson  Procedure(s) Performed: RIGHT TOTAL KNEE ARTHROPLASTY (Right Knee)     Patient location during evaluation: PACU Anesthesia Type: General Level of consciousness: awake and alert Pain management: pain level controlled Vital Signs Assessment: post-procedure vital signs reviewed and stable Respiratory status: spontaneous breathing, nonlabored ventilation, respiratory function stable and patient connected to nasal cannula oxygen Cardiovascular status: blood pressure returned to baseline and stable Postop Assessment: no apparent nausea or vomiting Anesthetic complications: no    Last Vitals:  Vitals:   11/25/17 1200 11/25/17 1215  BP: 137/81 115/82  Pulse: 73 69  Resp: 14 12  Temp:  (!) 36.3 C  SpO2: 98% 99%    Last Pain:  Vitals:   11/25/17 1215  TempSrc:   PainSc: Asleep                 Carlina Derks S

## 2017-11-25 NOTE — Progress Notes (Signed)
Assisted Dr. Rose with right, ultrasound guided, adductor canal block. Side rails up, monitors on throughout procedure. See vital signs in flow sheet. Tolerated Procedure well.  

## 2017-11-25 NOTE — Anesthesia Procedure Notes (Signed)
Procedure Name: LMA Insertion Date/Time: 11/25/2017 8:32 AM Performed by: Lind Covert, CRNA Pre-anesthesia Checklist: Patient identified, Emergency Drugs available, Suction available, Patient being monitored and Timeout performed Patient Re-evaluated:Patient Re-evaluated prior to induction Oxygen Delivery Method: Circle system utilized Preoxygenation: Pre-oxygenation with 100% oxygen Induction Type: IV induction LMA: LMA inserted LMA Size: 4.0 Tube type: Oral Number of attempts: 1 Placement Confirmation: positive ETCO2 and breath sounds checked- equal and bilateral Tube secured with: Tape Dental Injury: Teeth and Oropharynx as per pre-operative assessment

## 2017-11-25 NOTE — Interval H&P Note (Signed)
History and Physical Interval Note:  11/25/2017 7:04 AM  Emma Swanson  has presented today for surgery, with the diagnosis of Right knee osteoarthritis  The various methods of treatment have been discussed with the patient and family. After consideration of risks, benefits and other options for treatment, the patient has consented to  Procedure(s) with comments: RIGHT TOTAL KNEE ARTHROPLASTY (Right) - 70 mins as a surgical intervention .  The patient's history has been reviewed, patient examined, no change in status, stable for surgery.  I have reviewed the patient's chart and labs.  Questions were answered to the patient's satisfaction.     Mauri Pole

## 2017-11-25 NOTE — Evaluation (Signed)
Physical Therapy Evaluation Patient Details Name: Emma Swanson MRN: [id number] DOB: 1941/02/13 Today's Date: 11/25/2017   History of Present Illness  pt s/p RTKA on 11/25/2017  Clinical Impression  Pt is s/p R TKA resulting in the deficits listed below (see PT Problem List). Pt will benefit from acute PT to increase their independence and safety with mobility to allow discharge home with family assisting .      Follow Up Recommendations Follow surgeon's recommendation for DC plan and follow-up therapies(MD note stead OPPT )    Equipment Recommendations  None recommended by PT    Recommendations for Other Services       Precautions / Restrictions Precautions Precautions: Knee Restrictions Weight Bearing Restrictions: No      Mobility  Bed Mobility Overal bed mobility: Needs Assistance Bed Mobility: Supine to Sit;Sit to Supine     Supine to sit: Min assist     General bed mobility comments: for LE and cues for sequencing   Transfers Overall transfer level: Needs assistance Equipment used: Rolling walker (2 wheeled) Transfers: Sit to/from Stand Sit to Stand: Min assist         General transfer comment: cues for RW safety and sequencing   Ambulation/Gait Ambulation/Gait assistance: Min guard Ambulation Distance (Feet): 5 Feet Assistive device: Rolling walker (2 wheeled) Gait Pattern/deviations: Step-to pattern     General Gait Details: limited due  to pt feeling lightheaded  Stairs            Wheelchair Mobility    Modified Rankin (Stroke Patients Only)       Balance                                             Pertinent Vitals/Pain Pain Assessment: 0-10 Pain Score: 2  Pain Location: R Knee area  Pain Descriptors / Indicators: Aching Pain Intervention(s): Monitored during session;Ice applied    Home Living Family/patient expects to be discharged to:: Private residence Living Arrangements: Alone Available Help at  Discharge: Family(pt's brother and sister in law staying with her to help intially ) Type of Home: House Home Access: Stairs to enter Entrance Stairs-Rails: Right Entrance Stairs-Number of Steps: 6 Home Layout: One level Home Equipment: [place] - 2 wheels;Cane - single point;Bedside commode      Prior Function Level of Independence: Independent         Comments: airline flight attendent      Hand Dominance        Extremity/Trunk Assessment        Lower Extremity Assessment Lower Extremity Assessment: RLE deficits/detail RLE Deficits / Details: grossly 0-50 knee AAROM supine , and needed assistance for SLR        Communication   Communication: No difficulties  Cognition Arousal/Alertness: Awake/alert Behavior During Therapy: WFL for tasks assessed/performed Overall Cognitive Status: Within Functional Limits for tasks assessed                                        General Comments      Exercises Total Joint Exercises Ankle Circles/Pumps: AROM;Both;10 reps;Supine Quad Sets: AROM;10 reps;Right;Supine Heel Slides: AAROM;5 reps;Right;Supine Straight Leg Raises: AAROM;Right;5 reps;Supine   Assessment/Plan    PT Assessment Patient needs continued PT services  PT Problem List Decreased strength;Decreased range of motion;Decreased activity  tolerance;Decreased mobility       PT Treatment Interventions DME instruction;Gait training;Therapeutic activities;Therapeutic exercise;Stair training;Functional mobility training;Patient/family education    PT Goals (Current goals can be found in the Care Plan section)  Acute Rehab PT Goals Patient Stated Goal: i want to be able to move again without pain  PT Goal Formulation: With patient Time For Goal Achievement: 12/09/17 Potential to Achieve Goals: Good    Frequency 7X/week   Barriers to discharge        Co-evaluation               AM-PAC PT "6 Clicks" Daily Activity  Outcome Measure  Difficulty turning over in bed (including adjusting bedclothes, sheets and blankets)?: Unable Difficulty moving from lying on back to sitting on the side of the bed? : Unable Difficulty sitting down on and standing up from a chair with arms (e.g., wheelchair, bedside commode, etc,.)?: Unable Help needed moving to and from a bed to chair (including a wheelchair)?: A Little Help needed walking in hospital room?: A Little Help needed climbing 3-5 steps with a railing? : A Lot 6 Click Score: 11    End of Session Equipment Utilized During Treatment: Gait belt Activity Tolerance: Patient tolerated treatment well Patient left: in chair;with call bell/phone within reach;with family/visitor present(educated to call for any reason they need to move ) Nurse Communication: Mobility status PT Visit Diagnosis: Other abnormalities of gait and mobility (R26.89)    Time: 1725-1800 PT Time Calculation (min) (ACUTE ONLY): 35 min   Charges:   PT Evaluation $PT Eval Low Complexity: 1 Low PT Treatments $Gait Training: 8-22 mins   PT G CodesClide Dales 2017-12-20, 9:37 PM

## 2017-11-25 NOTE — Anesthesia Procedure Notes (Signed)
Anesthesia Regional Block: Adductor canal block   Pre-Anesthetic Checklist: ,, timeout performed, Correct Patient, Correct Site, Correct Laterality, Correct Procedure, Correct Position, site marked, Risks and benefits discussed,  Surgical consent,  Pre-op evaluation,  At surgeon's request and post-op pain management  Laterality: Right  Prep: chloraprep       Needles:  Injection technique: Single-shot  Needle Type: Echogenic Needle     Needle Length: 9cm      Additional Needles:   Procedures:,,,, ultrasound used (permanent image in chart),,,,  Narrative:  Start time: 11/25/2017 7:46 AM End time: 11/25/2017 7:54 AM Injection made incrementally with aspirations every 5 mL.  Performed by: Personally  Anesthesiologist: Myrtie Soman, MD  Additional Notes: Patient tolerated the procedure well without complications

## 2017-11-25 NOTE — Anesthesia Preprocedure Evaluation (Addendum)
Anesthesia Evaluation  Patient identified by MRN, date of birth, ID band Patient awake    Reviewed: Allergy & Precautions, NPO status , Patient's Chart, lab work & pertinent test results  History of Anesthesia Complications (+) PONV and MALIGNANT HYPERTHERMIA  Airway Mallampati: II  TM Distance: >3 FB Neck ROM: Full    Dental no notable dental hx.    Pulmonary neg pulmonary ROS, former smoker,    Pulmonary exam normal breath sounds clear to auscultation       Cardiovascular hypertension, Normal cardiovascular exam Rhythm:Regular Rate:Normal     Neuro/Psych negative neurological ROS  negative psych ROS   GI/Hepatic Neg liver ROS, GERD  ,  Endo/Other  negative endocrine ROS  Renal/GU negative Renal ROS  negative genitourinary   Musculoskeletal negative musculoskeletal ROS (+)   Abdominal   Peds negative pediatric ROS (+)  Hematology negative hematology ROS (+)   Anesthesia Other Findings   Reproductive/Obstetrics negative OB ROS                             Anesthesia Physical Anesthesia Plan  ASA: II  Anesthesia Plan: General   Post-op Pain Management:  Regional for Post-op pain   Induction: Intravenous  PONV Risk Score and Plan: 4 or greater and Treatment may vary due to age or medical condition, Ondansetron, Dexamethasone, TIVA and Midazolam  Airway Management Planned: LMA  Additional Equipment:   Intra-op Plan:   Post-operative Plan: Extubation in OR  Informed Consent: I have reviewed the patients History and Physical, chart, labs and discussed the procedure including the risks, benefits and alternatives for the proposed anesthesia with the patient or authorized representative who has indicated his/her understanding and acceptance.   Dental advisory given  Plan Discussed with: CRNA and Surgeon  Anesthesia Plan Comments:        Anesthesia Quick Evaluation

## 2017-11-26 DIAGNOSIS — M1711 Unilateral primary osteoarthritis, right knee: Secondary | ICD-10-CM | POA: Diagnosis not present

## 2017-11-26 LAB — BASIC METABOLIC PANEL
Anion gap: 5 (ref 5–15)
BUN: 17 mg/dL (ref 6–20)
CHLORIDE: 107 mmol/L (ref 101–111)
CO2: 25 mmol/L (ref 22–32)
CREATININE: 0.78 mg/dL (ref 0.44–1.00)
Calcium: 8.3 mg/dL — ABNORMAL LOW (ref 8.9–10.3)
GFR calc non Af Amer: >60 mL/min (ref >60)
Glucose, Bld: 103 mg/dL — ABNORMAL HIGH (ref 65–99)
POTASSIUM: 4.2 mmol/L (ref 3.5–5.1)
Sodium: 137 mmol/L (ref 135–145)

## 2017-11-26 LAB — CBC
HEMATOCRIT: 32.9 % — AB (ref 36.0–46.0)
HEMOGLOBIN: 10.9 g/dL — AB (ref 12.0–15.0)
MCH: 31.7 pg (ref 26.0–34.0)
MCHC: 33.1 g/dL (ref 30.0–36.0)
MCV: 95.6 fL (ref 78.0–100.0)
Platelets: 214 10*3/uL (ref 150–400)
RBC: 3.44 MIL/uL — AB (ref 3.87–5.11)
RDW: 13.1 % (ref 11.5–15.5)
WBC: 10.5 10*3/uL (ref 4.0–10.5)

## 2017-11-26 NOTE — Progress Notes (Signed)
Physical Therapy Treatment Patient Details Name: Emma Swanson MRN: [id number] DOB: 02-May-1941 Today's Date: 11/26/2017    History of Present Illness pt s/p RTKA on 11/25/2017    PT Comments    Progressing with mobility. Will plan to have a 2nd session prior to d/c later today.    Follow Up Recommendations  Follow surgeon's recommendation for DC plan and follow-up therapies     Equipment Recommendations  None recommended by PT    Recommendations for Other Services       Precautions / Restrictions Precautions Precautions: Knee Restrictions Weight Bearing Restrictions: No Other Position/Activity Restrictions: WBAT    Mobility  Bed Mobility Overal bed mobility: Needs Assistance Bed Mobility: Supine to Sit     Supine to sit: Supervision     General bed mobility comments: for safety. Increased time.   Transfers Overall transfer level: Needs assistance Equipment used: Rolling walker (2 wheeled) Transfers: Sit to/from Stand Sit to Stand: Min guard         General transfer comment: Close guard for safety. VCs hand placement. x 2 due to pt c/o dizziness standing the first time.   Ambulation/Gait Ambulation/Gait assistance: Min guard Ambulation Distance (Feet): 70 Feet Assistive device: Rolling walker (2 wheeled) Gait Pattern/deviations: Step-to pattern;Step-through pattern;Decreased stride length     General Gait Details: Close guard for safety. Followed with recliner for safety. Pt c/o mild lightheadedness.   Stairs             Wheelchair Mobility    Modified Rankin (Stroke Patients Only)       Balance                                            Cognition Arousal/Alertness: Awake/alert Behavior During Therapy: WFL for tasks assessed/performed Overall Cognitive Status: Within Functional Limits for tasks assessed                                        Exercises Total Joint Exercises Ankle Circles/Pumps:  AROM;Both;10 reps;Supine Quad Sets: AROM;10 reps;Right;Supine Heel Slides: AAROM;5 reps;Right;Supine Hip ABduction/ADduction: AROM;Right;10 reps;Supine Straight Leg Raises: AAROM;Right;10 reps;Supine Long Arc Quad: AROM;5 reps;Seated;Right Knee Flexion: AROM;Right;5 reps;Seated Goniometric ROM: ~5-90 degrees    General Comments        Pertinent Vitals/Pain Pain Assessment: 0-10 Pain Score: 6  Pain Location: R Knee  Pain Descriptors / Indicators: Aching;Discomfort;Sore Pain Intervention(s): Monitored during session;Repositioned;Ice applied    Home Living                      Prior Function            PT Goals (current goals can now be found in the care plan section) Progress towards PT goals: Progressing toward goals    Frequency    7X/week      PT Plan Current plan remains appropriate    Co-evaluation              AM-PAC PT "6 Clicks" Daily Activity  Outcome Measure  Difficulty turning over in bed (including adjusting bedclothes, sheets and blankets)?: None Difficulty moving from lying on back to sitting on the side of the bed? : A Little Difficulty sitting down on and standing up from a chair with arms (e.g., wheelchair, bedside commode, etc,.)?: A  Little Help needed moving to and from a bed to chair (including a wheelchair)?: A Little Help needed walking in hospital room?: A Little Help needed climbing 3-5 steps with a railing? : A Little 6 Click Score: 19    End of Session Equipment Utilized During Treatment: Gait belt Activity Tolerance: Patient tolerated treatment well Patient left: in chair;with call bell/phone within reach   PT Visit Diagnosis: Difficulty in walking, not elsewhere classified (R26.2);Pain;Other abnormalities of gait and mobility (R26.89) Pain - Right/Left: Right Pain - part of body: Knee     Time: 3825-0539 PT Time Calculation (min) (ACUTE ONLY): 23 min  Charges:  $Gait Training: 8-22 mins $Therapeutic Exercise:  8-22 mins                    G Codes:          Weston Anna, MPT Pager: [phone]

## 2017-11-26 NOTE — Progress Notes (Signed)
     Subjective: 1 Day Post-Op Procedure(s) (LRB): RIGHT TOTAL KNEE ARTHROPLASTY (Right)   Patient reports pain as mild, pain controlled. No events throughout the night. Feels that she is doing well and looking forward to working with PT. Discussed dressing care.  Ready to be discharged home.   Objective:   VITALS:   Vitals:   11/26/17 0136 11/26/17 0628  BP: 113/71 125/77  Pulse: 62 60  Resp:  20  Temp: 97.9 F (36.6 C) 98 F (36.7 C)  SpO2: 99% 100%    Dorsiflexion/Plantar flexion intact Incision: dressing C/D/I No cellulitis present Compartment soft  LABS Recent Labs    11/26/17 0608  HGB 10.9*  HCT 32.9*  WBC 10.5  PLT 214    Recent Labs    11/26/17 0608  NA 137  K 4.2  BUN 17  CREATININE 0.78  GLUCOSE 103*     Assessment/Plan: 1 Day Post-Op Procedure(s) (LRB): RIGHT TOTAL KNEE ARTHROPLASTY (Right) Foley cath d/c'ed Advance diet Up with therapy D/C IV fluids Discharge home Follow up in 2 weeks at [facility]. Follow up with OLIN,Mitesh Rosendahl D in 2 weeks.  Contact information:  [facility] [address] [place]   PAC  11/26/2017, 9:00 AM

## 2017-11-26 NOTE — Progress Notes (Signed)
Physical Therapy Treatment Patient Details Name: Emma Swanson MRN: [id number] DOB: Sep 19, 1940 Today's Date: 11/26/2017    History of Present Illness pt s/p R TKA on 11/25/2017    PT Comments    Progressing with mobility. Reviewed gait training and stair training. All education completed. Okay to d/c from PT standpoint-made RN aware.    Follow Up Recommendations  Follow surgeon's recommendation for DC plan and follow-up therapies     Equipment Recommendations  None recommended by PT    Recommendations for Other Services       Precautions / Restrictions Precautions Precautions: Knee Restrictions Weight Bearing Restrictions: No Other Position/Activity Restrictions: WBAT    Mobility  Bed Mobility               General bed mobility comments: oob in recliner  Transfers Overall transfer level: Needs assistance Equipment used: Rolling walker (2 wheeled) Transfers: Sit to/from Stand Sit to Stand: Min guard         General transfer comment: Close guard for safety. VCs hand placement.   Ambulation/Gait Ambulation/Gait assistance: Min guard Ambulation Distance (Feet): 60 Feet Assistive device: Rolling walker (2 wheeled) Gait Pattern/deviations: Step-to pattern;Step-through pattern;Decreased stride length     General Gait Details: Close guard for safety. No c/o lightheadedness   Stairs Stairs: Yes Stairs assistance: Min guard Stair Management: Step to pattern;One rail Left;Forwards Number of Stairs: 2 General stair comments: up and over portable steps with use of 1 handrail. VCs safety, technique, sequence. close guard for safety   Wheelchair Mobility    Modified Rankin (Stroke Patients Only)       Balance                                            Cognition Arousal/Alertness: Awake/alert Behavior During Therapy: WFL for tasks assessed/performed Overall Cognitive Status: Within Functional Limits for tasks assessed                                        Exercises      General Comments        Pertinent Vitals/Pain Pain Assessment: 0-10 Pain Score: 6  Pain Location: R Knee  Pain Descriptors / Indicators: Aching;Discomfort;Sore Pain Intervention(s): Monitored during session    Home Living                      Prior Function            PT Goals (current goals can now be found in the care plan section) Progress towards PT goals: Progressing toward goals    Frequency    7X/week      PT Plan Current plan remains appropriate    Co-evaluation              AM-PAC PT "6 Clicks" Daily Activity  Outcome Measure  Difficulty turning over in bed (including adjusting bedclothes, sheets and blankets)?: None Difficulty moving from lying on back to sitting on the side of the bed? : A Little Difficulty sitting down on and standing up from a chair with arms (e.g., wheelchair, bedside commode, etc,.)?: A Little Help needed moving to and from a bed to chair (including a wheelchair)?: A Little Help needed walking in hospital room?: A Little Help needed climbing 3-5 steps with  a railing? : A Little 6 Click Score: 19    End of Session Equipment Utilized During Treatment: Gait belt Activity Tolerance: Patient tolerated treatment well Patient left: in chair;with call bell/phone within reach;with family/visitor present   PT Visit Diagnosis: Difficulty in walking, not elsewhere classified (R26.2);Pain;Other abnormalities of gait and mobility (R26.89) Pain - Right/Left: Right Pain - part of body: Knee     Time: 1356-1406 PT Time Calculation (min) (ACUTE ONLY): 10 min  Charges:  $Gait Training: 8-22 mins                    G Codes:          Weston Anna, MPT Pager: [phone]

## 2017-11-28 DIAGNOSIS — M25561 Pain in right knee: Secondary | ICD-10-CM | POA: Insufficient documentation

## 2017-12-01 NOTE — Discharge Summary (Signed)
Physician Discharge Summary  Patient ID: Emma Swanson MRN: [id number] DOB/AGE: [birth year] 77 y.o.  Admit date: 11/25/2017 Discharge date: 11/26/2017   Procedures:  Procedure(s) (LRB): RIGHT TOTAL KNEE ARTHROPLASTY (Right)  Attending Physician:  Dr. Paralee Cancel   Admission Diagnoses:   Right knee primary OA / pain  Discharge Diagnoses:  Principal Problem:   S/P right TKA Active Problems:   S/P total knee replacement  Past Medical History:  Diagnosis Date  . Arthritis   . Cancer ([place])    hx of skin cancers   . Family history of adverse reaction to anesthesia    see malignant hyperthermia tab   . GERD (gastroesophageal reflux disease)   . Hypertension   . Malignant hyperthermia    cousin's son- - malignant hyperthermia al most died  ,  patient's brother and his 2 sons tested positive for malignant hyperthermia  . PONV (postoperative nausea and vomiting)     HPI:    Emma Swanson, 77 y.o. female, has a history of pain and functional disability in the right knee due to arthritis and has failed non-surgical conservative treatments for greater than 12 weeks to includeNSAID's and/or analgesics, corticosteriod injections, viscosupplementation injections and activity modification.  Onset of symptoms was gradual, starting >10 years ago with gradually worsening course since that time. The patient noted prior procedures on the knee to include  arthroscopy on the right knee(s).  Patient currently rates pain in the right knee(s) at 8 out of 10 with activity. Patient has night pain, worsening of pain with activity and weight bearing, pain that interferes with activities of daily living, pain with passive range of motion, crepitus and joint swelling.  Patient has evidence of periarticular osteophytes and joint space narrowing by imaging studies. There is no active infection.  Risks, benefits and expectations were discussed with the patient.  Risks including but not limited to the risk of  anesthesia, blood clots, nerve damage, blood vessel damage, failure of the prosthesis, infection and up to and including death.  Patient understand the risks, benefits and expectations and wishes to proceed with surgery.   PCP: Emma Mariscal, MD   Discharged Condition: good  Hospital Course:  Patient underwent the above stated procedure on 11/25/2017. Patient tolerated the procedure well and brought to the recovery room in good condition and subsequently to the floor.  POD #1 BP: 125/77 ; Pulse: 60 ; Temp: 98 F (36.7 C) ; Resp: 20 Patient reports pain as mild, pain controlled. No events throughout the night. Feels that she is doing well and looking forward to working with PT. Discussed dressing care.  Ready to be discharged home.  Dorsiflexion/plantar flexion intact, incision: dressing C/D/I, no cellulitis present and compartment soft.   LABS  Basename    HGB     10.9  HCT     32.9    Discharge Exam: General appearance: alert, cooperative and no distress Extremities: Homans sign is negative, no sign of DVT, no edema, redness or tenderness in the calves or thighs and no ulcers, gangrene or trophic changes  Disposition:  Home with follow up in 2 weeks   Follow-up Information    Paralee Cancel, MD. Schedule an appointment as soon as possible for a visit in 2 weeks.   Specialty:  Orthopedic Surgery Contact information: [address] [place] [address] [phone]           Discharge Instructions    Call MD / Call 911   Complete by:  As directed    If you experience chest pain or shortness of breath, CALL 911 and be transported to the hospital emergency room.  If you develope a fever above 101 F, pus (white drainage) or increased drainage or redness at the wound, or calf pain, call your surgeon's office.   Change dressing   Complete by:  As directed    Maintain surgical dressing until follow up in the clinic. If the edges start to pull up, may reinforce with tape.  If the dressing is no longer working, may remove and cover with gauze and tape, but must keep the area dry and clean.  Call with any questions or concerns.   Constipation Prevention   Complete by:  As directed    Drink plenty of fluids.  Prune juice may be helpful.  You may use a stool softener, such as Colace (over the counter) 100 mg twice a day.  Use MiraLax (over the counter) for constipation as needed.   Diet - low sodium heart healthy   Complete by:  As directed    Discharge instructions   Complete by:  As directed    Maintain surgical dressing until follow up in the clinic. If the edges start to pull up, may reinforce with tape. If the dressing is no longer working, may remove and cover with gauze and tape, but must keep the area dry and clean.  Follow up in 2 weeks at [facility]. Call with any questions or concerns.   Increase activity slowly as tolerated   Complete by:  As directed    Weight bearing as tolerated with assist device (walker, cane, etc) as directed, use it as long as suggested by your surgeon or therapist, typically at least 4-6 weeks.   TED hose   Complete by:  As directed    Use stockings (TED hose) for 2 weeks on both leg(s).  You may remove them at night for sleeping.      Allergies as of 11/26/2017      Reactions   Other    general anesthesia - pt's family has history of malignant hyperthermia   Oxycodone    Hallucinations    Penicillins    Childhood reaction Has patient had a PCN reaction causing immediate rash, facial/tongue/throat swelling, SOB or lightheadedness with hypotension: Unknown Has patient had a PCN reaction causing severe rash involving mucus membranes or skin necrosis: Unknown Has patient had a PCN reaction that required hospitalization: Unknown Has patient had a PCN reaction occurring within the last 10 years: No If all of the above answers are "NO", then may proceed with Cephalosporin use.      Medication List    STOP taking  these medications   naproxen sodium 220 MG tablet Commonly known as:  ALEVE     TAKE these medications   aspirin 81 MG chewable tablet Commonly known as:  ASPIRIN CHILDRENS Chew 1 tablet (81 mg total) by mouth 2 (two) times daily. Take for 4 weeks, then resume regular dose.   CALCIUM PO Take 1 tablet by mouth daily.   clobetasol ointment 0.05 % Commonly known as:  TEMOVATE Apply 1 application topically at bedtime.   docusate sodium 100 MG capsule Commonly known as:  COLACE Take 1 capsule (100 mg total) by mouth 2 (two) times daily.   ferrous sulfate 325 (65 FE) MG tablet Commonly known as:  FERROUSUL Take 1 tablet (325 mg total) by mouth 3 (three) times daily with meals.   GLUCOSAMINE-CHONDROITIN  PO Take 2 tablets by mouth daily.   HYDROcodone-acetaminophen 7.5-325 MG tablet Commonly known as:  NORCO Take 1-2 tablets by mouth every 4 (four) hours as needed for moderate pain.   hydrocortisone butyrate 0.1 % Crea cream Commonly known as:  LUCOID Apply 1 application topically daily as needed for rash.   ipratropium 0.06 % nasal spray Commonly known as:  ATROVENT Place 4 sprays into both nostrils daily.   lisinopril-hydrochlorothiazide 10-12.5 MG tablet Commonly known as:  PRINZIDE,ZESTORETIC Take 1 tablet by mouth daily.   methocarbamol 500 MG tablet Commonly known as:  ROBAXIN Take 1 tablet (500 mg total) by mouth every 6 (six) hours as needed for muscle spasms.   multivitamin with minerals Tabs tablet Take 1 tablet by mouth daily.   OVER THE COUNTER MEDICATION Apply 1 application topically daily as needed (joint pain). CBD cream   polyethylene glycol packet Commonly known as:  MIRALAX / GLYCOLAX Take 17 g by mouth 2 (two) times daily.   PREVALITE 4 g packet Generic drug:  cholestyramine light Take 4 g by mouth 2 (two) times daily.   TURMERIC PO Take 1 tablet by mouth daily.   VISINE OP Place 1 drop into both eyes daily as needed (dry eyes).   Vitamin  D3 5000 units Caps Take 5,000 Units by mouth daily.   zolpidem 10 MG tablet Commonly known as:  AMBIEN Take 5 mg by mouth See admin instructions. Take 5 mg at bedtime and second 5 mg dose in the middle of the night            Discharge Care Instructions  (From admission, onward)        Start     Ordered   11/26/17 0000  Change dressing    Comments:  Maintain surgical dressing until follow up in the clinic. If the edges start to pull up, may reinforce with tape. If the dressing is no longer working, may remove and cover with gauze and tape, but must keep the area dry and clean.  Call with any questions or concerns.   11/26/17 0902       Signed: West Pugh. Izrael Peak   PA-C  12/01/2017, 4:06 PM

## 2018-03-30 ENCOUNTER — Other Ambulatory Visit: Payer: Self-pay | Admitting: Internal Medicine

## 2018-03-30 DIAGNOSIS — Z1231 Encounter for screening mammogram for malignant neoplasm of breast: Secondary | ICD-10-CM

## 2018-03-30 NOTE — H&P (Signed)
TOTAL KNEE ADMISSION H&P  Patient is being admitted for left total knee arthroplasty.  Subjective:  Chief Complaint:    Left knee primary OA / pain  HPI: Emma Swanson, 77 y.o. female, has a history of pain and functional disability in the left knee due to arthritis and has failed non-surgical conservative treatments for greater than 12 weeks to includeNSAID's and/or analgesics, corticosteriod injections, viscosupplementation injections and activity modification.  Onset of symptoms was gradual, starting >10 years ago with gradually worsening course since that time. The patient noted prior procedures on the knee to include  arthroscopy on the left knee(s).  Patient currently rates pain in the left knee(s) at 7 out of 10 with activity. Patient has worsening of pain with activity and weight bearing, pain that interferes with activities of daily living, pain with passive range of motion, crepitus and joint swelling.  Patient has evidence of periarticular osteophytes and joint space narrowing by imaging studies.  There is no active infection.  Risks, benefits and expectations were discussed with the patient.  Risks including but not limited to the risk of anesthesia, blood clots, nerve damage, blood vessel damage, failure of the prosthesis, infection and up to and including death.  Patient understand the risks, benefits and expectations and wishes to proceed with surgery.   PCP: Sandi Mariscal, MD  D/C Plans:       Home  Post-op Meds:       No Rx given   Tranexamic Acid:      To be given - IV   Decadron:      Is to be given  FYI:      ASA  Norco  DME:   Pt already has equipment  PT:   OPPT    Patient Active Problem List   Diagnosis Date Noted  . S/P right TKA 11/25/2017  . S/P total knee replacement 11/25/2017   Past Medical History:  Diagnosis Date  . Arthritis   . Cancer ([place])    hx of skin cancers   . Family history of adverse reaction to anesthesia    see malignant hyperthermia tab    . GERD (gastroesophageal reflux disease)   . Hypertension   . Malignant hyperthermia    cousin's son- - malignant hyperthermia al most died  ,  patient's brother and his 2 sons tested positive for malignant hyperthermia  . PONV (postoperative nausea and vomiting)     Past Surgical History:  Procedure Laterality Date  . AUGMENTATION MAMMAPLASTY Bilateral   . bilateral arthroscopic knee surgery     . bone spur removal     . left ankle surgery - had pins     . thyroid surgery - age 40     . TOTAL KNEE ARTHROPLASTY Right 11/25/2017   Procedure: RIGHT TOTAL KNEE ARTHROPLASTY;  Surgeon: Paralee Cancel, MD;  Location: [facility];  Service: Orthopedics;  Laterality: Right;  70 mins    No current facility-administered medications for this encounter.    Current Outpatient Medications  Medication Sig Dispense Refill Last Dose  . CALCIUM PO Take 1 tablet by mouth daily.   11/24/2017 at Unknown time  . Cholecalciferol (VITAMIN D3) 5000 units CAPS Take 5,000 Units by mouth daily.   11/24/2017 at Unknown time  . clobetasol ointment (TEMOVATE) 3.81 % Apply 1 application topically at bedtime.  0 11/24/2017 at Unknown time  . docusate sodium (COLACE) 100 MG capsule Take 1 capsule (100 mg total) by mouth 2 (two) times daily. 10 capsule  0   . ferrous sulfate (FERROUSUL) 325 (65 FE) MG tablet Take 1 tablet (325 mg total) by mouth 3 (three) times daily with meals.  3   . GLUCOSAMINE-CHONDROITIN PO Take 2 tablets by mouth daily.   11/24/2017 at Unknown time  . HYDROcodone-acetaminophen (NORCO) 7.5-325 MG tablet Take 1-2 tablets by mouth every 4 (four) hours as needed for moderate pain. 60 tablet 0   . hydrocortisone butyrate (LUCOID) 0.1 % CREA cream Apply 1 application topically daily as needed for rash.  0 11/24/2017 at Unknown time  . ipratropium (ATROVENT) 0.06 % nasal spray Place 4 sprays into both nostrils daily.   11/25/2017 at Unknown time  . lisinopril-hydrochlorothiazide (PRINZIDE,ZESTORETIC) 10-12.5 MG tablet Take 1  tablet by mouth daily.   11/24/2017 at Unknown time  . methocarbamol (ROBAXIN) 500 MG tablet Take 1 tablet (500 mg total) by mouth every 6 (six) hours as needed for muscle spasms. 40 tablet 0   . Multiple Vitamin (MULTIVITAMIN WITH MINERALS) TABS tablet Take 1 tablet by mouth daily.   11/24/2017 at Unknown time  . OVER THE COUNTER MEDICATION Apply 1 application topically daily as needed (joint pain). CBD cream   11/24/2017 at Unknown time  . polyethylene glycol (MIRALAX / GLYCOLAX) packet Take 17 g by mouth 2 (two) times daily. 14 each 0   . PREVALITE 4 g packet Take 4 g by mouth 2 (two) times daily.  3 11/24/2017 at Unknown time  . Tetrahydrozoline HCl (VISINE OP) Place 1 drop into both eyes daily as needed (dry eyes).   Past Week at Unknown time  . TURMERIC PO Take 1 tablet by mouth daily.   11/24/2017 at Unknown time  . zolpidem (AMBIEN) 10 MG tablet Take 5 mg by mouth See admin instructions. Take 5 mg at bedtime and second 5 mg dose in the middle of the night   Past Week at Unknown time   Allergies  Allergen Reactions  . Other     general anesthesia - pt's family has history of malignant hyperthermia  . Oxycodone     Hallucinations   . Penicillins     Childhood reaction Has patient had a PCN reaction causing immediate rash, facial/tongue/throat swelling, SOB or lightheadedness with hypotension: Unknown Has patient had a PCN reaction causing severe rash involving mucus membranes or skin necrosis: Unknown Has patient had a PCN reaction that required hospitalization: Unknown Has patient had a PCN reaction occurring within the last 10 years: No If all of the above answers are "NO", then may proceed with Cephalosporin use.     Social History   Tobacco Use  . Smoking status: Former Research scientist (life sciences)  . Smokeless tobacco: Never Used  . Tobacco comment: age of 23 until age 40   Substance Use Topics  . Alcohol use: Yes    Comment: 2 drinks per nite        Review of Systems  Constitutional: Negative.    HENT: Negative.   Eyes: Negative.   Respiratory: Negative.   Cardiovascular: Negative.   Gastrointestinal: Positive for heartburn.  Genitourinary: Negative.   Musculoskeletal: Positive for back pain and joint pain.  Skin: Negative.   Neurological: Negative.   Endo/Heme/Allergies: Negative.   Psychiatric/Behavioral: Negative.     Objective:  Physical Exam  Constitutional: She is oriented to person, place, and time. She appears well-developed.  HENT:  Head: Normocephalic.  Eyes: Pupils are equal, round, and reactive to light.  Neck: Neck supple. No JVD present. No tracheal deviation present. No  thyromegaly present.  Cardiovascular: Normal rate, regular rhythm and intact distal pulses.  Respiratory: Effort normal and breath sounds normal. No respiratory distress. She has no wheezes.  GI: Soft. There is no tenderness. There is no guarding.  Musculoskeletal:       Left knee: She exhibits decreased range of motion, swelling and bony tenderness. She exhibits no ecchymosis, no deformity, no laceration and no erythema. Tenderness found. Medial joint line and lateral joint line tenderness noted.  Lymphadenopathy:    She has no cervical adenopathy.  Neurological: She is alert and oriented to person, place, and time. A sensory deficit (numbness in the left foot) is present.  Skin: Skin is warm and dry.  Psychiatric: She has a normal mood and affect.      Labs:  Estimated body mass index is 22.61 kg/m as calculated from the following:   Height as of 11/25/17: 5' 5.75" (1.67 m).   Weight as of 11/25/17: 63 kg.   Imaging Review Plain radiographs demonstrate severe degenerative joint disease of the left knee(s).  The bone quality appears to be good for age and reported activity level.   Preoperative templating of the joint replacement has been completed, documented, and submitted to the Operating Room personnel in order to optimize intra-operative equipment management.    Patient's  anticipated LOS is less than 2 midnights, meeting these requirements: - Lives within 1 hour of care - Has a competent adult at home to recover with post-op recover - NO history of  - Chronic pain requiring opiods  - Diabetes  - Coronary Artery Disease  - Heart failure  - Heart attack  - Stroke  - DVT/VTE  - Cardiac arrhythmia  - Respiratory Failure/COPD  - Renal failure  - Anemia  - Advanced Liver disease    Assessment/Plan:  End stage arthritis, left knee   The patient history, physical examination, clinical judgment of the provider and imaging studies are consistent with end stage degenerative joint disease of the left knee(s) and total knee arthroplasty is deemed medically necessary. The treatment options including medical management, injection therapy arthroscopy and arthroplasty were discussed at length. The risks and benefits of total knee arthroplasty were presented and reviewed. The risks due to aseptic loosening, infection, stiffness, patella tracking problems, thromboembolic complications and other imponderables were discussed. The patient acknowledged the explanation, agreed to proceed with the plan and consent was signed. Patient is being admitted for inpatient treatment for surgery, pain control, PT, OT, prophylactic antibiotics, VTE prophylaxis, progressive ambulation and ADL's and discharge planning. The patient is planning to be discharged home.    West Pugh Calogero Geisen   PA-C  03/30/2018, 11:24 AM

## 2018-04-02 ENCOUNTER — Ambulatory Visit
Admission: RE | Admit: 2018-04-02 | Discharge: 2018-04-02 | Disposition: A | Discharge status: Home / Self Care | Payer: BLUE CROSS/BLUE SHIELD | Source: Ambulatory Visit | Attending: Internal Medicine | Admitting: Internal Medicine

## 2018-04-02 DIAGNOSIS — Z1231 Encounter for screening mammogram for malignant neoplasm of breast: Secondary | ICD-10-CM

## 2018-04-06 NOTE — Patient Instructions (Signed)
Emma Swanson  04/06/2018   Your procedure is scheduled on: Thursday 04/16/2018  Report to [facility] Main  Entrance              Report to admitting at   0530  AM     Call this number if you have problems the morning of surgery [phone]    Remember: Do not eat food or drink liquids :After Midnight.              BRUSH YOUR TEETH MORNING OF SURGERY AND RINSE YOUR MOUTH OUT, NO CHEWING GUM CANDY OR MINTS.     Take these medicines the morning of surgery with A SIP OF WATER: use eye drops, use Atrovent nasal spray                                You may not have any metal on your body including hair pins and              piercings  Do not wear jewelry, make-up, lotions, powders or perfumes, deodorant             Do not wear nail polish.  Do not shave  48 hours prior to surgery.               Do not bring valuables to the hospital. [name].  Contacts, dentures or bridgework may not be worn into surgery.  Leave suitcase in the car. After surgery it may be brought to your room.                  Please read over the following fact sheets you were given: _____________________________________________________________________             [facility] - Preparing for Surgery Before surgery, you can play an important role.  Because skin is not sterile, your skin needs to be as free of germs as possible.  You can reduce the number of germs on your skin by washing with CHG (chlorahexidine gluconate) soap before surgery.  CHG is an antiseptic cleaner which kills germs and bonds with the skin to continue killing germs even after washing. Please DO NOT use if you have an allergy to CHG or antibacterial soaps.  If your skin becomes reddened/irritated stop using the CHG and inform your nurse when you arrive at Short Stay. Do not shave (including legs and underarms) for at least 48 hours prior to the first CHG shower.   You may shave your face/neck. Please follow these instructions carefully:  1.  Shower with CHG Soap the night before surgery and the  morning of Surgery.  2.  If you choose to wash your hair, wash your hair first as usual with your  normal  shampoo.  3.  After you shampoo, rinse your hair and body thoroughly to remove the  shampoo.                           4.  Use CHG as you would any other liquid soap.  You can apply chg directly  to the skin and wash  Gently with a scrungie or clean washcloth.  5.  Apply the CHG Soap to your body ONLY FROM THE NECK DOWN.   Do not use on face/ open                           Wound or open sores. Avoid contact with eyes, ears mouth and genitals (private parts).                       Wash face,  Genitals (private parts) with your normal soap.             6.  Wash thoroughly, paying special attention to the area where your surgery  will be performed.  7.  Thoroughly rinse your body with warm water from the neck down.  8.  DO NOT shower/wash with your normal soap after using and rinsing off  the CHG Soap.                9.  Pat yourself dry with a clean towel.            10.  Wear clean pajamas.            11.  Place clean sheets on your bed the night of your first shower and do not  sleep with pets. Day of Surgery : Do not apply any lotions/deodorants the morning of surgery.  Please wear clean clothes to the hospital/surgery center.  FAILURE TO FOLLOW THESE INSTRUCTIONS MAY RESULT IN THE CANCELLATION OF YOUR SURGERY PATIENT SIGNATURE_________________________________  NURSE SIGNATURE__________________________________  ________________________________________________________________________   Emma Swanson  An incentive spirometer is a tool that can help keep your lungs clear and active. This tool measures how well you are filling your lungs with each breath. Taking long deep breaths may help reverse or decrease the chance of  developing breathing (pulmonary) problems (especially infection) following:  A long period of time when you are unable to move or be active. BEFORE THE PROCEDURE   If the spirometer includes an indicator to show your best effort, your nurse or respiratory therapist will set it to a desired goal.  If possible, sit up straight or lean slightly forward. Try not to slouch.  Hold the incentive spirometer in an upright position. INSTRUCTIONS FOR USE  1. Sit on the edge of your bed if possible, or sit up as far as you can in bed or on a chair. 2. Hold the incentive spirometer in an upright position. 3. Breathe out normally. 4. Place the mouthpiece in your mouth and seal your lips tightly around it. 5. Breathe in slowly and as deeply as possible, raising the piston or the ball toward the top of the column. 6. Hold your breath for 3-5 seconds or for as long as possible. Allow the piston or ball to fall to the bottom of the column. 7. Remove the mouthpiece from your mouth and breathe out normally. 8. Rest for a few seconds and repeat Steps 1 through 7 at least 10 times every 1-2 hours when you are awake. Take your time and take a few normal breaths between deep breaths. 9. The spirometer may include an indicator to show your best effort. Use the indicator as a goal to work toward during each repetition. 10. After each set of 10 deep breaths, practice coughing to be sure your lungs are clear. If you have an incision (the cut made at the time of surgery),  support your incision when coughing by placing a pillow or rolled up towels firmly against it. Once you are able to get out of bed, walk around indoors and cough well. You may stop using the incentive spirometer when instructed by your caregiver.  RISKS AND COMPLICATIONS  Take your time so you do not get dizzy or light-headed.  If you are in pain, you may need to take or ask for pain medication before doing incentive spirometry. It is harder to take a  deep breath if you are having pain. AFTER USE  Rest and breathe slowly and easily.  It can be helpful to keep track of a log of your progress. Your caregiver can provide you with a simple table to help with this. If you are using the spirometer at home, follow these instructions: [place] IF:   You are having difficultly using the spirometer.  You have trouble using the spirometer as often as instructed.  Your pain medication is not giving enough relief while using the spirometer.  You develop fever of 100.5 F (38.1 C) or higher. SEEK IMMEDIATE MEDICAL CARE IF:   You cough up bloody sputum that had not been present before.  You develop fever of 102 F (38.9 C) or greater.  You develop worsening pain at or near the incision site. MAKE SURE YOU:   Understand these instructions.  Will watch your condition.  Will get help right away if you are not doing well or get worse. Document Released: 11/18/2006 Document Revised: 09/30/2011 Document Reviewed: 01/19/2007 ExitCare Patient Information 2014 ExitCare, [place].   ________________________________________________________________________  WHAT IS A BLOOD TRANSFUSION? Blood Transfusion Information  A transfusion is the replacement of blood or some of its parts. Blood is made up of multiple cells which provide different functions.  Red blood cells carry oxygen and are used for blood loss replacement.  White blood cells fight against infection.  Platelets control bleeding.  Plasma helps clot blood.  Other blood products are available for specialized needs, such as hemophilia or other clotting disorders. BEFORE THE TRANSFUSION  Who gives blood for transfusions?   Healthy volunteers who are fully evaluated to make sure their blood is safe. This is blood bank blood. Transfusion therapy is the safest it has ever been in the practice of medicine. Before blood is taken from a donor, a complete history is taken to make  sure that person has no history of diseases nor engages in risky social behavior (examples are intravenous drug use or sexual activity with multiple partners). The donor's travel history is screened to minimize risk of transmitting infections, such as malaria. The donated blood is tested for signs of infectious diseases, such as HIV and hepatitis. The blood is then tested to be sure it is compatible with you in order to minimize the chance of a transfusion reaction. If you or a relative donates blood, this is often done in anticipation of surgery and is not appropriate for emergency situations. It takes many days to process the donated blood. RISKS AND COMPLICATIONS Although transfusion therapy is very safe and saves many lives, the main dangers of transfusion include:   Getting an infectious disease.  Developing a transfusion reaction. This is an allergic reaction to something in the blood you were given. Every precaution is taken to prevent this. The decision to have a blood transfusion has been considered carefully by your caregiver before blood is given. Blood is not given unless the benefits outweigh the risks. AFTER THE TRANSFUSION  Right after receiving a blood transfusion, you will usually feel much better and more energetic. This is especially true if your red blood cells have gotten low (anemic). The transfusion raises the level of the red blood cells which carry oxygen, and this usually causes an energy increase.  The nurse administering the transfusion will monitor you carefully for complications. HOME CARE INSTRUCTIONS  No special instructions are needed after a transfusion. You may find your energy is better. Speak with your caregiver about any limitations on activity for underlying diseases you may have. SEEK MEDICAL CARE IF:   Your condition is not improving after your transfusion.  You develop redness or irritation at the intravenous (IV) site. SEEK IMMEDIATE MEDICAL CARE IF:   Any of the following symptoms occur over the next 12 hours:  Shaking chills.  You have a temperature by mouth above 102 F (38.9 C), not controlled by medicine.  Chest, back, or muscle pain.  People around you feel you are not acting correctly or are confused.  Shortness of breath or difficulty breathing.  Dizziness and fainting.  You get a rash or develop hives.  You have a decrease in urine output.  Your urine turns a dark color or changes to pink, red, or brown. Any of the following symptoms occur over the next 10 days:  You have a temperature by mouth above 102 F (38.9 C), not controlled by medicine.  Shortness of breath.  Weakness after normal activity.  The white part of the eye turns yellow (jaundice).  You have a decrease in the amount of urine or are urinating less often.  Your urine turns a dark color or changes to pink, red, or brown. Document Released: 07/05/2000 Document Revised: 09/30/2011 Document Reviewed: 02/22/2008 [facility] Patient Information 2014 [place], [place].  _______________________________________________________________________

## 2018-04-06 NOTE — Progress Notes (Signed)
11/18/2017- noted in Epic- EKG

## 2018-04-10 ENCOUNTER — Encounter (HOSPITAL_COMMUNITY)
Admission: RE | Admit: 2018-04-10 | Discharge: 2018-04-10 | Disposition: A | Discharge status: Home / Self Care | Payer: BLUE CROSS/BLUE SHIELD | Source: Ambulatory Visit | Attending: Orthopedic Surgery | Admitting: Orthopedic Surgery

## 2018-04-10 ENCOUNTER — Other Ambulatory Visit: Payer: Self-pay

## 2018-04-10 ENCOUNTER — Encounter (HOSPITAL_COMMUNITY): Payer: Self-pay

## 2018-04-10 DIAGNOSIS — M1712 Unilateral primary osteoarthritis, left knee: Secondary | ICD-10-CM | POA: Diagnosis not present

## 2018-04-10 DIAGNOSIS — M25562 Pain in left knee: Secondary | ICD-10-CM | POA: Insufficient documentation

## 2018-04-10 DIAGNOSIS — Z01812 Encounter for preprocedural laboratory examination: Secondary | ICD-10-CM | POA: Insufficient documentation

## 2018-04-10 LAB — CBC
HEMATOCRIT: 42.6 % (ref 36.0–46.0)
Hemoglobin: 14.2 g/dL (ref 12.0–15.0)
MCH: 30.7 pg (ref 26.0–34.0)
MCHC: 33.3 g/dL (ref 30.0–36.0)
MCV: 92.2 fL (ref 78.0–100.0)
PLATELETS: 328 10*3/uL (ref 150–400)
RBC: 4.62 MIL/uL (ref 3.87–5.11)
RDW: 13.9 % (ref 11.5–15.5)
WBC: 5.1 10*3/uL (ref 4.0–10.5)

## 2018-04-10 LAB — BASIC METABOLIC PANEL
Anion gap: 11 (ref 5–15)
BUN: 22 mg/dL (ref 8–23)
CO2: 28 mmol/L (ref 22–32)
CREATININE: 0.74 mg/dL (ref 0.44–1.00)
Calcium: 9.5 mg/dL (ref 8.9–10.3)
Chloride: 98 mmol/L (ref 98–111)
GFR calc Af Amer: >60 mL/min (ref >60)
GLUCOSE: 89 mg/dL (ref 70–99)
POTASSIUM: 4.1 mmol/L (ref 3.5–5.1)
SODIUM: 137 mmol/L (ref 135–145)

## 2018-04-10 LAB — SURGICAL PCR SCREEN
MRSA, PCR: NEGATIVE
STAPHYLOCOCCUS AUREUS: NEGATIVE

## 2018-04-10 NOTE — Progress Notes (Signed)
02/22/2018-EKG from [facility] Main on chart

## 2018-04-15 NOTE — Anesthesia Preprocedure Evaluation (Addendum)
Anesthesia Evaluation  Patient identified by MRN, date of birth, ID band Patient awake    Reviewed: Allergy & Precautions, NPO status , Patient's Chart, lab work & pertinent test results  History of Anesthesia Complications (+) PONV, MALIGNANT HYPERTHERMIA, Family history of anesthesia reaction and history of anesthetic complications  Airway Mallampati: II  TM Distance: >3 FB Neck ROM: Full    Dental no notable dental hx. (+) Dental Advisory Given   Pulmonary neg pulmonary ROS, former smoker,    Pulmonary exam normal breath sounds clear to auscultation       Cardiovascular hypertension, Pt. on medications Normal cardiovascular exam     Neuro/Psych negative neurological ROS  negative psych ROS   GI/Hepatic Neg liver ROS, GERD  ,  Endo/Other  negative endocrine ROS  Renal/GU negative Renal ROS  negative genitourinary   Musculoskeletal negative musculoskeletal ROS (+)   Abdominal   Peds negative pediatric ROS (+)  Hematology negative hematology ROS (+)   Anesthesia Other Findings   Reproductive/Obstetrics negative OB ROS                            Anesthesia Physical  Anesthesia Plan  ASA: II  Anesthesia Plan: Spinal   Post-op Pain Management:  Regional for Post-op pain   Induction:   PONV Risk Score and Plan: 3 and Ondansetron, Dexamethasone and Diphenhydramine  Airway Management Planned: Natural Airway and Simple Face Mask  Additional Equipment:   Intra-op Plan:   Post-operative Plan:   Informed Consent: I have reviewed the patients History and Physical, chart, labs and discussed the procedure including the risks, benefits and alternatives for the proposed anesthesia with the patient or authorized representative who has indicated his/her understanding and acceptance.   Dental advisory given  Plan Discussed with: CRNA, Surgeon and Anesthesiologist  Anesthesia Plan  Comments:        Anesthesia Quick Evaluation

## 2018-04-16 ENCOUNTER — Encounter (HOSPITAL_COMMUNITY)
Admission: RE | Disposition: A | Discharge status: Home / Self Care | Payer: Self-pay | Source: Home / Self Care | Attending: Orthopedic Surgery

## 2018-04-16 ENCOUNTER — Inpatient Hospital Stay (HOSPITAL_COMMUNITY)
Admission: RE | Admit: 2018-04-16 | Discharge: 2018-04-17 | DRG: 470 | Disposition: A | Discharge status: Home / Self Care | Payer: BLUE CROSS/BLUE SHIELD | Attending: Orthopedic Surgery | Admitting: Orthopedic Surgery

## 2018-04-16 ENCOUNTER — Inpatient Hospital Stay (HOSPITAL_COMMUNITY): Payer: BLUE CROSS/BLUE SHIELD | Admitting: Anesthesiology

## 2018-04-16 ENCOUNTER — Other Ambulatory Visit: Payer: Self-pay

## 2018-04-16 ENCOUNTER — Encounter (HOSPITAL_COMMUNITY): Payer: Self-pay

## 2018-04-16 DIAGNOSIS — Z96659 Presence of unspecified artificial knee joint: Secondary | ICD-10-CM

## 2018-04-16 DIAGNOSIS — Z96651 Presence of right artificial knee joint: Secondary | ICD-10-CM | POA: Diagnosis present

## 2018-04-16 DIAGNOSIS — Z880 Allergy status to penicillin: Secondary | ICD-10-CM

## 2018-04-16 DIAGNOSIS — I10 Essential (primary) hypertension: Secondary | ICD-10-CM | POA: Diagnosis present

## 2018-04-16 DIAGNOSIS — Z85828 Personal history of other malignant neoplasm of skin: Secondary | ICD-10-CM

## 2018-04-16 DIAGNOSIS — M25762 Osteophyte, left knee: Secondary | ICD-10-CM | POA: Diagnosis present

## 2018-04-16 DIAGNOSIS — M549 Dorsalgia, unspecified: Secondary | ICD-10-CM | POA: Diagnosis present

## 2018-04-16 DIAGNOSIS — M1712 Unilateral primary osteoarthritis, left knee: Principal | ICD-10-CM | POA: Diagnosis present

## 2018-04-16 DIAGNOSIS — K219 Gastro-esophageal reflux disease without esophagitis: Secondary | ICD-10-CM | POA: Diagnosis present

## 2018-04-16 DIAGNOSIS — Z79899 Other long term (current) drug therapy: Secondary | ICD-10-CM

## 2018-04-16 DIAGNOSIS — R12 Heartburn: Secondary | ICD-10-CM | POA: Diagnosis present

## 2018-04-16 DIAGNOSIS — Z96652 Presence of left artificial knee joint: Secondary | ICD-10-CM

## 2018-04-16 DIAGNOSIS — Z23 Encounter for immunization: Secondary | ICD-10-CM

## 2018-04-16 DIAGNOSIS — Z87891 Personal history of nicotine dependence: Secondary | ICD-10-CM

## 2018-04-16 DIAGNOSIS — Z885 Allergy status to narcotic agent status: Secondary | ICD-10-CM

## 2018-04-16 DIAGNOSIS — Z79891 Long term (current) use of opiate analgesic: Secondary | ICD-10-CM

## 2018-04-16 HISTORY — PX: TOTAL KNEE ARTHROPLASTY: SHX125

## 2018-04-16 LAB — TYPE AND SCREEN
ABO/RH(D): O POS
ANTIBODY SCREEN: NEGATIVE

## 2018-04-16 SURGERY — ARTHROPLASTY, KNEE, TOTAL
Anesthesia: Spinal | Site: Knee | Laterality: Left

## 2018-04-16 MED ORDER — 0.9 % SODIUM CHLORIDE (POUR BTL) OPTIME
TOPICAL | Status: DC | PRN
  Administered 2018-04-16: 1000 mL

## 2018-04-16 MED ORDER — ZOLPIDEM TARTRATE 10 MG PO TABS
10.0000 mg | ORAL_TABLET | Freq: Every day | ORAL | Status: DC
  Administered 2018-04-16: 10 mg via ORAL
  Filled 2018-04-16 (×2): qty 1

## 2018-04-16 MED ORDER — METHOCARBAMOL 500 MG IVPB - SIMPLE MED
INTRAVENOUS | Status: AC
  Administered 2018-04-16: 500 mg via INTRAVENOUS
  Filled 2018-04-16: qty 50

## 2018-04-16 MED ORDER — BUPIVACAINE HCL (PF) 0.75 % IJ SOLN
INTRAMUSCULAR | Status: DC | PRN
  Administered 2018-04-16: 1.6 mL via INTRATHECAL

## 2018-04-16 MED ORDER — ALUM & MAG HYDROXIDE-SIMETH 200-200-20 MG/5ML PO SUSP: 15.0000 mL | ORAL | Status: DC | PRN

## 2018-04-16 MED ORDER — METHOCARBAMOL 500 MG PO TABS: 500.0000 mg | ORAL_TABLET | Freq: Four times a day (QID) | ORAL | Status: DC | PRN

## 2018-04-16 MED ORDER — BUDESONIDE 3 MG PO CPEP
3.0000 mg | ORAL_CAPSULE | Freq: Every day | ORAL | Status: DC
  Administered 2018-04-16 – 2018-04-17 (×2): 3 mg via ORAL
  Filled 2018-04-16 (×2): qty 1

## 2018-04-16 MED ORDER — PROPOFOL 500 MG/50ML IV EMUL
INTRAVENOUS | Status: DC | PRN
  Administered 2018-04-16: 50 ug/kg/min via INTRAVENOUS

## 2018-04-16 MED ORDER — DEXAMETHASONE SODIUM PHOSPHATE 10 MG/ML IJ SOLN
10.0000 mg | Freq: Once | INTRAMUSCULAR | Status: AC
  Administered 2018-04-16: 10 mg via INTRAVENOUS

## 2018-04-16 MED ORDER — PROPOFOL 500 MG/50ML IV EMUL
INTRAVENOUS | Status: DC | PRN
  Administered 2018-04-16: 30 mg via INTRAVENOUS

## 2018-04-16 MED ORDER — BISACODYL 10 MG RE SUPP: 10.0000 mg | Freq: Every day | RECTAL | Status: DC | PRN

## 2018-04-16 MED ORDER — CELECOXIB 200 MG PO CAPS
200.0000 mg | ORAL_CAPSULE | Freq: Two times a day (BID) | ORAL | Status: DC
  Administered 2018-04-16 – 2018-04-17 (×3): 200 mg via ORAL
  Filled 2018-04-16 (×4): qty 1

## 2018-04-16 MED ORDER — EPHEDRINE SULFATE 50 MG/ML IJ SOLN
INTRAMUSCULAR | Status: DC | PRN
  Administered 2018-04-16 (×2): 10 mg via INTRAVENOUS

## 2018-04-16 MED ORDER — METOCLOPRAMIDE HCL 5 MG/ML IJ SOLN: 5.0000 mg | Freq: Three times a day (TID) | INTRAMUSCULAR | Status: DC | PRN

## 2018-04-16 MED ORDER — TRANEXAMIC ACID 1000 MG/10ML IV SOLN
1000.0000 mg | Freq: Once | INTRAVENOUS | Status: AC
  Administered 2018-04-16: 1000 mg via INTRAVENOUS
  Filled 2018-04-16: qty 1000

## 2018-04-16 MED ORDER — LACTATED RINGERS IV SOLN
INTRAVENOUS | Status: DC
  Administered 2018-04-16: 08:00:00 via INTRAVENOUS
  Administered 2018-04-16: 1000 mL via INTRAVENOUS

## 2018-04-16 MED ORDER — ACETAMINOPHEN 325 MG PO TABS: 325.0000 mg | ORAL_TABLET | Freq: Four times a day (QID) | ORAL | Status: DC | PRN

## 2018-04-16 MED ORDER — ROPIVACAINE HCL 7.5 MG/ML IJ SOLN
INTRAMUSCULAR | Status: DC | PRN
  Administered 2018-04-16: 20 mL via PERINEURAL

## 2018-04-16 MED ORDER — INFLUENZA VAC SPLIT HIGH-DOSE 0.5 ML IM SUSY
0.5000 mL | PREFILLED_SYRINGE | INTRAMUSCULAR | Status: AC
  Administered 2018-04-17: 0.5 mL via INTRAMUSCULAR
  Filled 2018-04-16: qty 0.5

## 2018-04-16 MED ORDER — DIPHENHYDRAMINE HCL 12.5 MG/5ML PO ELIX: 12.5000 mg | ORAL_SOLUTION | ORAL | Status: DC | PRN

## 2018-04-16 MED ORDER — CEFAZOLIN SODIUM-DEXTROSE 2-4 GM/100ML-% IV SOLN
INTRAVENOUS | Status: AC
  Filled 2018-04-16: qty 100

## 2018-04-16 MED ORDER — PROPOFOL 10 MG/ML IV BOLUS
INTRAVENOUS | Status: AC
  Filled 2018-04-16: qty 20

## 2018-04-16 MED ORDER — MORPHINE SULFATE (PF) 2 MG/ML IV SOLN
0.5000 mg | INTRAVENOUS | Status: DC | PRN
  Administered 2018-04-16: 1 mg via INTRAVENOUS
  Filled 2018-04-16: qty 1

## 2018-04-16 MED ORDER — CHLORHEXIDINE GLUCONATE 4 % EX LIQD: 60.0000 mL | Freq: Once | CUTANEOUS | Status: DC

## 2018-04-16 MED ORDER — FENTANYL CITRATE (PF) 100 MCG/2ML IJ SOLN: 25.0000 ug | INTRAMUSCULAR | Status: DC | PRN

## 2018-04-16 MED ORDER — ONDANSETRON HCL 4 MG PO TABS: 4.0000 mg | ORAL_TABLET | Freq: Four times a day (QID) | ORAL | Status: DC | PRN

## 2018-04-16 MED ORDER — BUPIVACAINE-EPINEPHRINE (PF) 0.25% -1:200000 IJ SOLN
INTRAMUSCULAR | Status: DC | PRN
  Administered 2018-04-16: 30 mL

## 2018-04-16 MED ORDER — SODIUM CHLORIDE 0.9 % IR SOLN
Status: DC | PRN
  Administered 2018-04-16: 1000 mL

## 2018-04-16 MED ORDER — SODIUM CHLORIDE 0.9 % IJ SOLN
INTRAMUSCULAR | Status: AC
  Filled 2018-04-16: qty 50

## 2018-04-16 MED ORDER — KETOROLAC TROMETHAMINE 30 MG/ML IJ SOLN
INTRAMUSCULAR | Status: AC
  Filled 2018-04-16: qty 1

## 2018-04-16 MED ORDER — PROPOFOL 10 MG/ML IV BOLUS
INTRAVENOUS | Status: AC
  Filled 2018-04-16: qty 40

## 2018-04-16 MED ORDER — DEXAMETHASONE SODIUM PHOSPHATE 10 MG/ML IJ SOLN
10.0000 mg | Freq: Once | INTRAMUSCULAR | Status: AC
  Administered 2018-04-17: 10 mg via INTRAVENOUS
  Filled 2018-04-16: qty 1

## 2018-04-16 MED ORDER — HYDROCODONE-ACETAMINOPHEN 5-325 MG PO TABS
1.0000 | ORAL_TABLET | ORAL | Status: DC | PRN
  Administered 2018-04-16: 2 via ORAL
  Filled 2018-04-16 (×2): qty 2

## 2018-04-16 MED ORDER — CEFAZOLIN SODIUM-DEXTROSE 2-3 GM-%(50ML) IV SOLR
INTRAVENOUS | Status: DC | PRN
  Administered 2018-04-16: 2 g via INTRAVENOUS

## 2018-04-16 MED ORDER — STERILE WATER FOR IRRIGATION IR SOLN
Status: DC | PRN
  Administered 2018-04-16: 2000 mL

## 2018-04-16 MED ORDER — BUPIVACAINE-EPINEPHRINE (PF) 0.25% -1:200000 IJ SOLN
INTRAMUSCULAR | Status: AC
  Filled 2018-04-16: qty 30

## 2018-04-16 MED ORDER — ONDANSETRON HCL 4 MG/2ML IJ SOLN
INTRAMUSCULAR | Status: DC | PRN
  Administered 2018-04-16: 4 mg via INTRAVENOUS

## 2018-04-16 MED ORDER — DOCUSATE SODIUM 100 MG PO CAPS
100.0000 mg | ORAL_CAPSULE | Freq: Two times a day (BID) | ORAL | Status: DC
  Administered 2018-04-16 – 2018-04-17 (×2): 100 mg via ORAL
  Filled 2018-04-16 (×3): qty 1

## 2018-04-16 MED ORDER — KETOROLAC TROMETHAMINE 30 MG/ML IJ SOLN
INTRAMUSCULAR | Status: DC | PRN
  Administered 2018-04-16: 30 mg

## 2018-04-16 MED ORDER — FENTANYL CITRATE (PF) 100 MCG/2ML IJ SOLN
INTRAMUSCULAR | Status: AC
  Filled 2018-04-16: qty 2

## 2018-04-16 MED ORDER — ONDANSETRON HCL 4 MG/2ML IJ SOLN: 4.0000 mg | Freq: Four times a day (QID) | INTRAMUSCULAR | Status: DC | PRN

## 2018-04-16 MED ORDER — ASPIRIN 81 MG PO CHEW
81.0000 mg | CHEWABLE_TABLET | Freq: Two times a day (BID) | ORAL | Status: DC
  Administered 2018-04-16 – 2018-04-17 (×2): 81 mg via ORAL
  Filled 2018-04-16 (×3): qty 1

## 2018-04-16 MED ORDER — PROMETHAZINE HCL 25 MG/ML IJ SOLN: 6.2500 mg | INTRAMUSCULAR | Status: DC | PRN

## 2018-04-16 MED ORDER — TRANEXAMIC ACID 1000 MG/10ML IV SOLN
1000.0000 mg | INTRAVENOUS | Status: AC
  Administered 2018-04-16: 1000 mg via INTRAVENOUS
  Filled 2018-04-16: qty 10

## 2018-04-16 MED ORDER — SODIUM CHLORIDE 0.9 % IJ SOLN
INTRAMUSCULAR | Status: DC | PRN
  Administered 2018-04-16: 30 mL

## 2018-04-16 MED ORDER — HYDROCODONE-ACETAMINOPHEN 7.5-325 MG PO TABS
1.0000 | ORAL_TABLET | ORAL | Status: DC | PRN
  Administered 2018-04-16 – 2018-04-17 (×4): 2 via ORAL
  Filled 2018-04-16 (×4): qty 2

## 2018-04-16 MED ORDER — MIDAZOLAM HCL 2 MG/2ML IJ SOLN
1.0000 mg | INTRAMUSCULAR | Status: DC | PRN
  Administered 2018-04-16 (×2): 1 mg via INTRAVENOUS
  Filled 2018-04-16: qty 2

## 2018-04-16 MED ORDER — IPRATROPIUM BROMIDE 0.06 % NA SOLN
4.0000 | Freq: Every day | NASAL | Status: DC
  Administered 2018-04-17: 4 via NASAL
  Filled 2018-04-16: qty 15

## 2018-04-16 MED ORDER — PHENOL 1.4 % MT LIQD: 1.0000 | OROMUCOSAL | Status: DC | PRN

## 2018-04-16 MED ORDER — POLYETHYLENE GLYCOL 3350 17 G PO PACK
17.0000 g | PACK | Freq: Two times a day (BID) | ORAL | Status: DC
  Administered 2018-04-16 – 2018-04-17 (×2): 17 g via ORAL
  Filled 2018-04-16 (×3): qty 1

## 2018-04-16 MED ORDER — FERROUS SULFATE 325 (65 FE) MG PO TABS
325.0000 mg | ORAL_TABLET | Freq: Two times a day (BID) | ORAL | Status: DC
  Administered 2018-04-16 – 2018-04-17 (×2): 325 mg via ORAL
  Filled 2018-04-16 (×2): qty 1

## 2018-04-16 MED ORDER — MAGNESIUM CITRATE PO SOLN: 1.0000 | Freq: Once | ORAL | Status: DC | PRN

## 2018-04-16 MED ORDER — CEFAZOLIN SODIUM-DEXTROSE 2-4 GM/100ML-% IV SOLN
2.0000 g | Freq: Four times a day (QID) | INTRAVENOUS | Status: AC
  Administered 2018-04-16 (×2): 2 g via INTRAVENOUS
  Filled 2018-04-16 (×3): qty 100

## 2018-04-16 MED ORDER — MENTHOL 3 MG MT LOZG: 1.0000 | LOZENGE | OROMUCOSAL | Status: DC | PRN

## 2018-04-16 MED ORDER — METOCLOPRAMIDE HCL 5 MG PO TABS: 5.0000 mg | ORAL_TABLET | Freq: Three times a day (TID) | ORAL | Status: DC | PRN

## 2018-04-16 MED ORDER — FENTANYL CITRATE (PF) 100 MCG/2ML IJ SOLN
50.0000 ug | INTRAMUSCULAR | Status: DC | PRN
  Administered 2018-04-16: 100 ug via INTRAVENOUS
  Filled 2018-04-16: qty 2

## 2018-04-16 MED ORDER — SODIUM CHLORIDE 0.9 % IV SOLN
INTRAVENOUS | Status: DC
  Administered 2018-04-16 – 2018-04-17 (×2): via INTRAVENOUS

## 2018-04-16 MED ORDER — METHOCARBAMOL 500 MG IVPB - SIMPLE MED
500.0000 mg | Freq: Four times a day (QID) | INTRAVENOUS | Status: DC | PRN
  Administered 2018-04-16: 500 mg via INTRAVENOUS
  Filled 2018-04-16: qty 50

## 2018-04-16 SURGICAL SUPPLY — 50 items
ATTUNE MED ANAT PAT 35 KNEE (Knees) ×2 IMPLANT
ATTUNE PSFEM LTSZ5 NARCEM KNEE (Femur) ×2 IMPLANT
ATTUNE PSRP INSR SZ5 6 KNEE (Insert) ×2 IMPLANT
BAG ZIPLOCK 12X15 (MISCELLANEOUS) IMPLANT
BANDAGE ACE 6X5 VEL STRL LF (GAUZE/BANDAGES/DRESSINGS) ×2 IMPLANT
BASEPLATE TIBIAL ROTATING SZ 4 (Knees) ×2 IMPLANT
BLADE SAW SGTL 11.0X1.19X90.0M (BLADE) IMPLANT
BLADE SAW SGTL 13.0X1.19X90.0M (BLADE) ×2 IMPLANT
BOWL SMART MIX CTS (DISPOSABLE) ×2 IMPLANT
CEMENT HV SMART SET (Cement) ×4 IMPLANT
COVER SURGICAL LIGHT HANDLE (MISCELLANEOUS) ×2 IMPLANT
CUFF TOURN SGL QUICK 34 (TOURNIQUET CUFF) ×1
CUFF TRNQT CYL 34X4X40X1 (TOURNIQUET CUFF) ×1 IMPLANT
DECANTER SPIKE VIAL GLASS SM (MISCELLANEOUS) ×4 IMPLANT
DERMABOND ADVANCED (GAUZE/BANDAGES/DRESSINGS) ×1
DERMABOND ADVANCED .7 DNX12 (GAUZE/BANDAGES/DRESSINGS) ×1 IMPLANT
DRAPE U-SHAPE 47X51 STRL (DRAPES) ×2 IMPLANT
DRESSING AQUACEL AG SP 3.5X10 (GAUZE/BANDAGES/DRESSINGS) ×1 IMPLANT
DRSG AQUACEL AG SP 3.5X10 (GAUZE/BANDAGES/DRESSINGS) ×2
DURAPREP 26ML APPLICATOR (WOUND CARE) ×4 IMPLANT
ELECT REM PT RETURN 15FT ADLT (MISCELLANEOUS) ×2 IMPLANT
GLOVE BIOGEL M 7.0 STRL (GLOVE) IMPLANT
GLOVE BIOGEL PI IND STRL 7.5 (GLOVE) ×1 IMPLANT
GLOVE BIOGEL PI IND STRL 8.5 (GLOVE) ×1 IMPLANT
GLOVE BIOGEL PI INDICATOR 7.5 (GLOVE) ×1
GLOVE BIOGEL PI INDICATOR 8.5 (GLOVE) ×1
GLOVE ECLIPSE 8.0 STRL XLNG CF (GLOVE) ×2 IMPLANT
GLOVE ORTHO TXT STRL SZ7.5 (GLOVE) ×4 IMPLANT
GOWN STRL REUS W/TWL 2XL LVL3 (GOWN DISPOSABLE) ×2 IMPLANT
GOWN STRL REUS W/TWL LRG LVL3 (GOWN DISPOSABLE) ×2 IMPLANT
HANDPIECE INTERPULSE COAX TIP (DISPOSABLE) ×1
HOLDER FOLEY CATH W/STRAP (MISCELLANEOUS) IMPLANT
MANIFOLD NEPTUNE II (INSTRUMENTS) ×2 IMPLANT
NDL SAFETY ECLIPSE 18X1.5 (NEEDLE) IMPLANT
NEEDLE HYPO 18GX1.5 SHARP (NEEDLE)
PACK TOTAL KNEE CUSTOM (KITS) ×2 IMPLANT
PIN THREADED HEADED SIGMA (PIN) ×4 IMPLANT
POSITIONER SURGICAL ARM (MISCELLANEOUS) ×2 IMPLANT
SET HNDPC FAN SPRY TIP SCT (DISPOSABLE) ×1 IMPLANT
SET PAD KNEE POSITIONER (MISCELLANEOUS) ×2 IMPLANT
SUT MNCRL AB 4-0 PS2 18 (SUTURE) ×2 IMPLANT
SUT STRATAFIX PDS+ 0 24IN (SUTURE) ×2 IMPLANT
SUT VIC AB 1 CT1 36 (SUTURE) ×2 IMPLANT
SUT VIC AB 2-0 CT1 27 (SUTURE) ×3
SUT VIC AB 2-0 CT1 TAPERPNT 27 (SUTURE) ×3 IMPLANT
SYRINGE 3CC LL L/F (MISCELLANEOUS) ×2 IMPLANT
TRAY FOLEY MTR SLVR 16FR STAT (SET/KITS/TRAYS/PACK) ×2 IMPLANT
WATER STERILE IRR 1000ML POUR (IV SOLUTION) ×2 IMPLANT
WRAP KNEE MAXI GEL POST OP (GAUZE/BANDAGES/DRESSINGS) ×2 IMPLANT
YANKAUER SUCT BULB TIP 10FT TU (MISCELLANEOUS) ×2 IMPLANT

## 2018-04-16 NOTE — Evaluation (Signed)
Physical Therapy Evaluation Patient Details Name: Emma Swanson MRN: [id number] DOB: Oct 18, 1940 Today's Date: 04/16/2018   History of Present Illness  77 YO female s/p L TKR on 04/16/18. 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  Clinical Impression   Pt presents with L knee pain exacerbated with mobility, difficulty performing bed mobility, and increased time and effort to perform transfers/gait. Pt mostly limited by pain this session. Acute PT to address deficits. Pt ambulated short hallway distance with RW with min guard assist. PT to continue to progress mobility as able, and will continue to follow acutely.       Follow Up Recommendations Follow surgeon's recommendation for DC plan and follow-up therapies;Supervision for mobility/OOB(OPPT)    Equipment Recommendations  None recommended by PT    Recommendations for Other Services       Precautions / Restrictions Precautions Precautions: Fall;Knee Restrictions Weight Bearing Restrictions: No Other Position/Activity Restrictions: WBAT       Mobility  Bed Mobility Overal bed mobility: Needs Assistance Bed Mobility: Supine to Sit;Sit to Supine     Supine to sit: Min assist;HOB elevated Sit to supine: Min assist;HOB elevated   General bed mobility comments: Min assist for LLE management, pt with increased time for trunk elevation. Verbal cuing for sequencing. Prior to mobility, pt reported "wooziness" she feels is from being tired. BP 129/80, HR 67 bpm, SPO2 98% on room air.   Transfers Overall transfer level: Needs assistance Equipment used: Rolling walker (2 wheeled) Transfers: Sit to/from Stand Sit to Stand: Min assist;From elevated surface         General transfer comment: Min guard for safety. Verbal cuing for hand placement. Pt with no LLE buckling with weight shifts L and R prior to ambulation.   Ambulation/Gait Ambulation/Gait assistance: Min guard Gait Distance  (Feet): 40 Feet Assistive device: Rolling walker (2 wheeled) Gait Pattern/deviations: Step-to pattern;Decreased stride length;Decreased stance time - left;Decreased weight shift to left;Antalgic Gait velocity: decr    General Gait Details: Pt with increased L knee pain with ambulation, pt self-limited by decreasing weight shifting to LLE.   Stairs            Wheelchair Mobility    Modified Rankin (Stroke Patients Only)       Balance Overall balance assessment: Mild deficits observed, not formally tested                                           Pertinent Vitals/Pain Pain Assessment: 0-10 Pain Score: 1  Pain Location: L knee  Pain Descriptors / Indicators: Aching Pain Intervention(s): Limited activity within patient's tolerance;Repositioned;Ice applied;Monitored during session;Premedicated before session    Home Living Family/patient expects to be discharged to:: Private residence Living Arrangements: Alone Available Help at Discharge: Family;Available PRN/intermittently(brother and sister-in-law to stay a couple of nights ) Type of Home: House Home Access: Stairs to enter Entrance Stairs-Rails: Left Entrance Stairs-Number of Steps: 6 Home Layout: One level Home Equipment: Walker - 2 wheels;Cane - single point;Bedside commode;Shower seat;Toilet riser Additional Comments: Also has foam wedge and ice machine for knee     Prior Function Level of Independence: Independent         Comments: airline flight attendant      Hand Dominance   Dominant Hand: Right    Extremity/Trunk Assessment   Upper Extremity Assessment Upper Extremity Assessment: Overall  WFL for tasks assessed    Lower Extremity Assessment Lower Extremity Assessment: Overall WFL for tasks assessed(suspected post-surgical LLE weakness; able to perfrom quad sets x5 limited by pain, SLR with assist, and ankle pumps )    Cervical / Trunk Assessment Cervical / Trunk Assessment:  Normal  Communication   Communication: No difficulties  Cognition Arousal/Alertness: Awake/alert Behavior During Therapy: WFL for tasks assessed/performed Overall Cognitive Status: Within Functional Limits for tasks assessed                                        General Comments      Exercises Total Joint Exercises Ankle Circles/Pumps: AROM;Both;10 reps;Supine Quad Sets: AROM;Left;5 reps;Supine Heel Slides: AROM;Left;Supine(2 reps )   Assessment/Plan    PT Assessment Patient needs continued PT services  PT Problem List Decreased strength;Pain;Decreased range of motion;Decreased knowledge of use of DME;Decreased activity tolerance;Decreased balance;Decreased mobility       PT Treatment Interventions DME instruction;Therapeutic activities;Gait training;Therapeutic exercise;Patient/family education;Stair training;Balance training;Functional mobility training    PT Goals (Current goals can be found in the Care Plan section)  Acute Rehab PT Goals PT Goal Formulation: With patient Time For Goal Achievement: 04/23/18 Potential to Achieve Goals: Good    Frequency 7X/week   Barriers to discharge        Co-evaluation               AM-PAC PT "6 Clicks" Daily Activity  Outcome Measure Difficulty turning over in bed (including adjusting bedclothes, sheets and blankets)?: Unable Difficulty moving from lying on back to sitting on the side of the bed? : Unable Difficulty sitting down on and standing up from a chair with arms (e.g., wheelchair, bedside commode, etc,.)?: Unable Help needed moving to and from a bed to chair (including a wheelchair)?: A Little Help needed walking in hospital room?: A Little Help needed climbing 3-5 steps with a railing? : A Little 6 Click Score: 12    End of Session Equipment Utilized During Treatment: Gait belt Activity Tolerance: Patient tolerated treatment well;Patient limited by pain Patient left: in bed;with bed alarm  set;with call bell/phone within reach;with SCD's reapplied Nurse Communication: Mobility status;Patient requests pain meds PT Visit Diagnosis: Other abnormalities of gait and mobility (R26.89);Difficulty in walking, not elsewhere classified (R26.2)    Time: 2620-3559 PT Time Calculation (min) (ACUTE ONLY): 31 min   Charges:   PT Evaluation $PT Eval Low Complexity: 1 Low PT Treatments $Gait Training: 8-22 mins        Julien Girt, PT Acute Rehabilitation Services Pager [phone]  Office [phone]   Keegen Heffern D Elonda Husky 04/16/2018, 2:45 PM

## 2018-04-16 NOTE — Transfer of Care (Signed)
Immediate Anesthesia Transfer of Care Note  Patient: Emma Swanson  Procedure(s) Performed: LEFT TOTAL KNEE ARTHROPLASTY (Left Knee)  Patient Location: PACU  Anesthesia Type:Spinal  Level of Consciousness: awake, alert  and oriented  Airway & Oxygen Therapy: Patient Spontanous Breathing and Patient connected to face mask oxygen  Post-op Assessment: Report given to RN and Post -op Vital signs reviewed and stable  Post vital signs: Reviewed and stable  Last Vitals:  Vitals Value Taken Time  BP    Temp    Pulse 62 04/16/2018  9:11 AM  Resp 14 04/16/2018  9:11 AM  SpO2 100 % 04/16/2018  9:11 AM  Vitals shown include unvalidated device data.  Last Pain:  Vitals:   04/16/18 0618  TempSrc:   PainSc: 0-No pain      Patients Stated Pain Goal: 4 (02/72/53 6644)  Complications: No apparent anesthesia complications

## 2018-04-16 NOTE — Anesthesia Procedure Notes (Addendum)
Spinal  Patient location during procedure: OR Start time: 04/16/2018 7:24 AM End time: 04/16/2018 7:34 AM Staffing Anesthesiologist: Duane Boston, MD Performed: anesthesiologist  Preanesthetic Checklist Completed: patient identified, surgical consent, pre-op evaluation, timeout performed, IV checked, risks and benefits discussed and monitors and equipment checked Spinal Block Patient position: sitting Prep: DuraPrep Patient monitoring: cardiac monitor, continuous pulse ox and blood pressure Approach: midline Location: L2-3 Injection technique: single-shot Needle Needle type: Pencan  Needle gauge: 24 G Needle length: 9 cm Additional Notes Functioning IV was confirmed and monitors were applied. Sterile prep and drape, including hand hygiene and sterile gloves were used. The patient was positioned and the spine was prepped. The skin was anesthetized with lidocaine.  Free flow of clear CSF was obtained prior to injecting local anesthetic into the CSF.  The spinal needle aspirated freely following injection.  The needle was carefully withdrawn.  The patient tolerated the procedure well.

## 2018-04-16 NOTE — Anesthesia Procedure Notes (Signed)
Anesthesia Regional Block: Adductor canal block   Pre-Anesthetic Checklist: ,, timeout performed, Correct Patient, Correct Site, Correct Laterality, Correct Procedure, Correct Position, site marked, Risks and benefits discussed,  Surgical consent,  Pre-op evaluation,  At surgeon's request and post-op pain management  Laterality: Left  Prep: chloraprep       Needles:  Injection technique: Single-shot  Needle Type: Stimulator Needle - 80     Needle Length: 10cm  Needle Gauge: 21     Additional Needles:   Narrative:  Start time: 04/16/2018 6:57 AM End time: 04/16/2018 7:07 AM Injection made incrementally with aspirations every 5 mL.  Performed by: Personally

## 2018-04-16 NOTE — Plan of Care (Signed)

## 2018-04-16 NOTE — Op Note (Signed)
NAME:  Emma Swanson                      MEDICAL RECORD NO.:  [id number]                             FACILITY:  [facility]      PHYSICIAN:  Pietro Cassis. Alvan Dame, M.D.  DATE OF BIRTH:  1941/06/30      DATE OF PROCEDURE:  04/16/2018                                     OPERATIVE REPORT         PREOPERATIVE DIAGNOSIS:  Left knee osteoarthritis.      POSTOPERATIVE DIAGNOSIS:  Left knee osteoarthritis.      FINDINGS:  The patient was noted to have complete loss of cartilage and   bone-on-bone arthritis with associated osteophytes in the lateral and patellofemoral compartments of   the knee with a significant synovitis and associated effusion.  The patient had failed months of conservative treatment including medications, injection therapy, activity modification.     PROCEDURE:  Left total knee replacement.      COMPONENTS USED:  DePuy Attune rotating platform posterior stabilized knee   system, a size 5N femur, 4 tibia, size 6 mm PS AOX insert, and 35 anatomic patellar   button.      SURGEON:  Pietro Cassis. Alvan Dame, M.D.      ASSISTANT:  Danae Orleans, PA-C.      ANESTHESIA:  Regional and Spinal.      SPECIMENS:  None.      COMPLICATION:  None.      DRAINS:  None.  EBL: <100      TOURNIQUET TIME:   Total Tourniquet Time Documented: Thigh (Left) - 30 minutes Total: Thigh (Left) - 30 minutes  .      The patient was stable to the recovery room.      INDICATION FOR PROCEDURE:  Emma Swanson is a 77 y.o. female patient of   mine.  The patient had been seen, evaluated, and treated for months conservatively in the   office with medication, activity modification, and injections.  The patient had   radiographic changes of bone-on-bone arthritis with endplate sclerosis and osteophytes noted.  Based on the radiographic changes and failed conservative measures, the patient   decided to proceed with definitive treatment, total knee replacement.  Risks of infection, DVT, component failure, need  for revision surgery, neurovascular injury were reviewed in the office setting.  The postop course was reviewed stressing the efforts to maximize post-operative satisfaction and function.  Consent was obtained for benefit of pain   relief.      PROCEDURE IN DETAIL:  The patient was brought to the operative theater.   Once adequate anesthesia, preoperative antibiotics, 2 gm of Ancef,1 gm of Tranexamic Acid, and 10 mg of Decadron administered, the patient was positioned supine with a left thigh tourniquet placed.  The  left lower extremity was prepped and draped in sterile fashion.  A time-   out was performed identifying the patient, planned procedure, and the appropriate extremity.      The left lower extremity was placed in the [facility] leg holder.  The leg was   exsanguinated, tourniquet elevated to 250 mmHg.  A midline incision was   made followed by  median parapatellar arthrotomy.  Following initial   exposure, attention was first directed to the patella.  Precut   measurement was noted to be 22 mm.  I resected down to 13 mm and used a   35 anatomic patellar button to restore patellar height as well as cover the cut surface.      The lug holes were drilled and a metal shim was placed to protect the   patella from retractors and saw blade during the procedure.      At this point, attention was now directed to the femur.  The femoral   canal was opened with a drill, irrigated to try to prevent fat emboli.  An   intramedullary rod was passed at 3 degrees valgus, 9 mm of bone was   resected off the distal femur.  Following this resection, the tibia was   subluxated anteriorly.  Using the extramedullary guide, 4 mm of bone was resected off   the proximal medial tibia.  We confirmed the gap would be   stable medially and laterally with a size 5 spacer block as well as confirmed that the tibial cut was perpendicular in the coronal plane, checking with an alignment rod.      Once this was done, I  sized the femur to be a size 5 in the anterior-   posterior dimension, chose a narrow component based on medial and   lateral dimension.  The size 5 rotation block was then pinned in   position anterior referenced using the C-clamp to set rotation.  The   anterior, posterior, and  chamfer cuts were made without difficulty nor   notching making certain that I was along the anterior cortex to help   with flexion gap stability.      The final box cut was made off the lateral aspect of distal femur.      At this point, the tibia was sized to be a size 4.  The size 4 tray was   then pinned in position through the medial third of the tubercle,   drilled, and keel punched.  Trial reduction was now carried with a 5 femur,  4 tibia, a size 5 then 6 mm PS insert, and the 35 anatomic patella botton.  The knee was brought to full extension with good flexion stability with the patella   tracking through the trochlea without application of pressure.  Given   all these findings the trial components removed.  Final components were   opened and cement was mixed.  The knee was irrigated with normal saline solution and pulse lavage.  The synovial lining was   then injected with 30 cc of 0.25% Marcaine with epinephrine, 1 cc of Toradol and 30 cc of NS for a total of 61 cc.     Final implants were then cemented onto cleaned and dried cut surfaces of bone with the knee brought to extension with a size 6 mm PS trial insert.      Once the cement had fully cured, excess cement was removed   throughout the knee.  I confirmed that I was satisfied with the range of   motion and stability, and the final size 6 mm PS AOX insert was chosen.  It was   placed into the knee.      The tourniquet had been let down at 30 minutes.  No significant   hemostasis was required.  The extensor mechanism was then reapproximated using #1 Vicryl and #  1 Stratafix sutures with the knee   in flexion.  The   remaining wound was closed  with 2-0 Vicryl and running 4-0 Monocryl.   The knee was cleaned, dried, dressed sterilely using Dermabond and   Aquacel dressing.  The patient was then   brought to recovery room in stable condition, tolerating the procedure   well.   Please note that Physician Assistant, Danae Orleans, PA-C was present for the entirety of the case, and was utilized for pre-operative positioning, peri-operative retractor management, general facilitation of the procedure and for primary wound closure at the end of the case.              Pietro Cassis Alvan Dame, M.D.    04/16/2018 8:52 AM

## 2018-04-16 NOTE — Interval H&P Note (Signed)
History and Physical Interval Note:  04/16/2018 7:19 AM  Emma Swanson  has presented today for surgery, with the diagnosis of Left knee osteoarthritis  The various methods of treatment have been discussed with the patient and family. After consideration of risks, benefits and other options for treatment, the patient has consented to  Procedure(s) with comments: LEFT TOTAL KNEE ARTHROPLASTY (Left) - 90 mins as a surgical intervention .  The patient's history has been reviewed, patient examined, no change in status, stable for surgery.  I have reviewed the patient's chart and labs.  Questions were answered to the patient's satisfaction.     Mauri Pole

## 2018-04-16 NOTE — Anesthesia Postprocedure Evaluation (Signed)
Anesthesia Post Note  Patient: Emma Swanson  Procedure(s) Performed: LEFT TOTAL KNEE ARTHROPLASTY (Left Knee)     Patient location during evaluation: PACU Anesthesia Type: Spinal Level of consciousness: awake and alert Pain management: pain level controlled Vital Signs Assessment: post-procedure vital signs reviewed and stable Respiratory status: spontaneous breathing and respiratory function stable Cardiovascular status: blood pressure returned to baseline and stable Postop Assessment: spinal receding Anesthetic complications: no    Last Vitals:  Vitals:   04/16/18 0945 04/16/18 1000  BP: 121/72 115/69  Pulse: 60 62  Resp: 16 17  Temp:  (!) 36.2 C  SpO2: 100% 100%    Last Pain:  Vitals:   04/16/18 1000  TempSrc:   PainSc: 0-No pain                 Darron Stuck DANIEL

## 2018-04-17 ENCOUNTER — Encounter (HOSPITAL_COMMUNITY): Payer: Self-pay | Admitting: Orthopedic Surgery

## 2018-04-17 DIAGNOSIS — Z79891 Long term (current) use of opiate analgesic: Secondary | ICD-10-CM | POA: Diagnosis not present

## 2018-04-17 DIAGNOSIS — M1712 Unilateral primary osteoarthritis, left knee: Secondary | ICD-10-CM | POA: Diagnosis present

## 2018-04-17 DIAGNOSIS — M549 Dorsalgia, unspecified: Secondary | ICD-10-CM | POA: Diagnosis present

## 2018-04-17 DIAGNOSIS — M25762 Osteophyte, left knee: Secondary | ICD-10-CM | POA: Diagnosis present

## 2018-04-17 DIAGNOSIS — Z23 Encounter for immunization: Secondary | ICD-10-CM | POA: Diagnosis not present

## 2018-04-17 DIAGNOSIS — K219 Gastro-esophageal reflux disease without esophagitis: Secondary | ICD-10-CM | POA: Diagnosis present

## 2018-04-17 DIAGNOSIS — Z96651 Presence of right artificial knee joint: Secondary | ICD-10-CM | POA: Diagnosis present

## 2018-04-17 DIAGNOSIS — I10 Essential (primary) hypertension: Secondary | ICD-10-CM | POA: Diagnosis present

## 2018-04-17 DIAGNOSIS — Z885 Allergy status to narcotic agent status: Secondary | ICD-10-CM | POA: Diagnosis not present

## 2018-04-17 DIAGNOSIS — Z880 Allergy status to penicillin: Secondary | ICD-10-CM | POA: Diagnosis not present

## 2018-04-17 DIAGNOSIS — Z87891 Personal history of nicotine dependence: Secondary | ICD-10-CM | POA: Diagnosis not present

## 2018-04-17 DIAGNOSIS — Z85828 Personal history of other malignant neoplasm of skin: Secondary | ICD-10-CM | POA: Diagnosis not present

## 2018-04-17 DIAGNOSIS — R12 Heartburn: Secondary | ICD-10-CM | POA: Diagnosis present

## 2018-04-17 DIAGNOSIS — Z79899 Other long term (current) drug therapy: Secondary | ICD-10-CM | POA: Diagnosis not present

## 2018-04-17 LAB — CBC
HCT: 32.8 % — ABNORMAL LOW (ref 36.0–46.0)
HEMOGLOBIN: 11.1 g/dL — AB (ref 12.0–15.0)
MCH: 31.3 pg (ref 26.0–34.0)
MCHC: 33.8 g/dL (ref 30.0–36.0)
MCV: 92.4 fL (ref 78.0–100.0)
Platelets: 250 10*3/uL (ref 150–400)
RBC: 3.55 MIL/uL — AB (ref 3.87–5.11)
RDW: 13.4 % (ref 11.5–15.5)
WBC: 12.1 10*3/uL — ABNORMAL HIGH (ref 4.0–10.5)

## 2018-04-17 LAB — BASIC METABOLIC PANEL
Anion gap: 8 (ref 5–15)
BUN: 21 mg/dL (ref 8–23)
CHLORIDE: 106 mmol/L (ref 98–111)
CO2: 25 mmol/L (ref 22–32)
Calcium: 8.7 mg/dL — ABNORMAL LOW (ref 8.9–10.3)
Creatinine, Ser: 0.73 mg/dL (ref 0.44–1.00)
GFR calc non Af Amer: >60 mL/min (ref >60)
Glucose, Bld: 104 mg/dL — ABNORMAL HIGH (ref 70–99)
POTASSIUM: 4.6 mmol/L (ref 3.5–5.1)
Sodium: 139 mmol/L (ref 135–145)

## 2018-04-17 MED ORDER — CELECOXIB 200 MG PO CAPS: 200.0000 mg | ORAL_CAPSULE | Freq: Two times a day (BID) | ORAL | 0 refills | Status: DC

## 2018-04-17 MED ORDER — DOCUSATE SODIUM 100 MG PO CAPS: 100.0000 mg | ORAL_CAPSULE | Freq: Two times a day (BID) | ORAL | 0 refills | Status: DC

## 2018-04-17 MED ORDER — ASPIRIN 81 MG PO CHEW: 81.0000 mg | CHEWABLE_TABLET | Freq: Two times a day (BID) | ORAL | 0 refills | Status: AC

## 2018-04-17 MED ORDER — FERROUS SULFATE 325 (65 FE) MG PO TABS: 325.0000 mg | ORAL_TABLET | Freq: Three times a day (TID) | ORAL | 3 refills | Status: DC

## 2018-04-17 MED ORDER — METHOCARBAMOL 500 MG PO TABS: 500.0000 mg | ORAL_TABLET | Freq: Four times a day (QID) | ORAL | 0 refills | Status: DC | PRN

## 2018-04-17 MED ORDER — HYDROCODONE-ACETAMINOPHEN 7.5-325 MG PO TABS: 1.0000 | ORAL_TABLET | ORAL | 0 refills | Status: DC | PRN

## 2018-04-17 MED ORDER — POLYETHYLENE GLYCOL 3350 17 G PO PACK: 17.0000 g | PACK | Freq: Two times a day (BID) | ORAL | 0 refills | Status: DC

## 2018-04-17 NOTE — Progress Notes (Signed)
Physical Therapy Treatment Patient Details Name: Emma Swanson MRN: [id number] DOB: 21-May-1941 Today's Date: 04/17/2018    History of Present Illness 77 YO female s/p L TKR on 04/16/18. 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    PT Comments    Pt progressing well and reviewed stairs and home therex program with written instruction provided.   Follow Up Recommendations  Follow surgeon's recommendation for DC plan and follow-up therapies;Supervision for mobility/OOB     Equipment Recommendations  None recommended by PT    Recommendations for Other Services       Precautions / Restrictions Precautions Precautions: Fall;Knee Restrictions Weight Bearing Restrictions: No Other Position/Activity Restrictions: WBAT     Mobility  Bed Mobility Overal bed mobility: Needs Assistance Bed Mobility: Supine to Sit     Supine to sit: Min guard     General bed mobility comments: NT - pt up in chair and requests back to same  Transfers Overall transfer level: Needs assistance Equipment used: Rolling walker (2 wheeled) Transfers: Sit to/from Stand Sit to Stand: Supervision         General transfer comment: cues for LE management and use of UEs to self assist  Ambulation/Gait Ambulation/Gait assistance: Min guard;Supervision Gait Distance (Feet): 150 Feet Assistive device: Rolling walker (2 wheeled) Gait Pattern/deviations: Step-to pattern;Step-through pattern;Decreased step length - right;Decreased step length - left;Shuffle Gait velocity: decr    General Gait Details: min cues for posture and postion from RW   Stairs Stairs: Yes Stairs assistance: Min assist Stair Management: One rail Left;Step to pattern;Forwards;With cane Number of Stairs: 5 General stair comments: cues for sequence and foot/cane placement   Wheelchair Mobility    Modified Rankin (Stroke Patients Only)       Balance Overall balance assessment: Mild  deficits observed, not formally tested                                          Cognition Arousal/Alertness: Awake/alert Behavior During Therapy: WFL for tasks assessed/performed Overall Cognitive Status: Within Functional Limits for tasks assessed                                        Exercises Total Joint Exercises Ankle Circles/Pumps: AROM;Both;Supine;15 reps Quad Sets: AROM;Left;Supine;10 reps Heel Slides: Left;Supine;AAROM;10 reps Straight Leg Raises: AAROM;AROM;Left;10 reps;Supine Long Arc Quad: AROM;Left;10 reps;Seated Knee Flexion: AROM;AAROM;Left;5 reps;Seated    General Comments        Pertinent Vitals/Pain Pain Assessment: 0-10 Pain Score: 4  Pain Location: L knee  Pain Descriptors / Indicators: Aching Pain Intervention(s): Limited activity within patient's tolerance;Monitored during session;Premedicated before session;Ice applied    Home Living                      Prior Function            PT Goals (current goals can now be found in the care plan section) Acute Rehab PT Goals Patient Stated Goal: Regain IND PT Goal Formulation: With patient Time For Goal Achievement: 04/23/18 Potential to Achieve Goals: Good Progress towards PT goals: Progressing toward goals    Frequency    7X/week      PT Plan Current plan remains appropriate    Co-evaluation  AM-PAC PT "6 Clicks" Daily Activity  Outcome Measure  Difficulty turning over in bed (including adjusting bedclothes, sheets and blankets)?: A Lot Difficulty moving from lying on back to sitting on the side of the bed? : A Lot Difficulty sitting down on and standing up from a chair with arms (e.g., wheelchair, bedside commode, etc,.)?: A Lot Help needed moving to and from a bed to chair (including a wheelchair)?: A Little Help needed walking in hospital room?: A Little Help needed climbing 3-5 steps with a railing? : A Little 6 Click  Score: 15    End of Session Equipment Utilized During Treatment: Gait belt Activity Tolerance: Patient tolerated treatment well Patient left: in chair;with call bell/phone within reach;with family/visitor present Nurse Communication: Mobility status PT Visit Diagnosis: Other abnormalities of gait and mobility (R26.89);Difficulty in walking, not elsewhere classified (R26.2)     Time: 1856-3149 PT Time Calculation (min) (ACUTE ONLY): 30 min  Charges:  $Gait Training: 8-22 mins $Therapeutic Exercise: 8-22 mins                     [place] Pager [phone] Office [phone]    Landen Knoedler 04/17/2018, 12:35 PM

## 2018-04-17 NOTE — Progress Notes (Signed)
     Subjective: 1 Day Post-Op Procedure(s) (LRB): LEFT TOTAL KNEE ARTHROPLASTY (Left)   Patient reports pain as mild, pain controlled well with Norco.  No events throughout the night. Had the right TKA 4 months ago and recovered well from that one. Looking forward to a similar recovery with the right knee.    Patient's anticipated LOS is less than 2 midnights, meeting these requirements: - Lives within 1 hour of care - Has a competent adult at home to recover with post-op recover - NO history of  - Chronic pain requiring opiods  - Diabetes  - Coronary Artery Disease  - Heart failure  - Heart attack  - Stroke  - DVT/VTE  - Cardiac arrhythmia  - Respiratory Failure/COPD  - Renal failure  - Anemia  - Advanced Liver disease       Objective:   VITALS:   Vitals:   04/17/18 0137 04/17/18 0533  BP: 101/69 118/72  Pulse: 62 62  Resp: 16 16  Temp:  97.7 F (36.5 C)  SpO2: 93% 97%    Dorsiflexion/Plantar flexion intact Incision: dressing C/D/I No cellulitis present Compartment soft  LABS Recent Labs    04/17/18 0507  HGB 11.1*  HCT 32.8*  WBC 12.1*  PLT 250    Recent Labs    04/17/18 0507  NA 139  K 4.6  BUN 21  CREATININE 0.73  GLUCOSE 104*     Assessment/Plan: 1 Day Post-Op Procedure(s) (LRB): LEFT TOTAL KNEE ARTHROPLASTY (Left) Foley cath d/c'ed Advance diet Up with therapy D/C IV fluids Discharge home  Follow up in 2 weeks at [facility] ([place]). Follow up with OLIN,Mikel Hardgrove D in 2 weeks.  Contact information:  [facility]) [address] [place]. Albertina Leise   PAC  04/17/2018, 8:13 AM

## 2018-04-17 NOTE — Progress Notes (Signed)
Physical Therapy Treatment Patient Details Name: Emma Swanson MRN: [id number] DOB: [birth year] Today's Date: 04/17/2018    History of Present Illness 77 YO female s/p L TKR on 04/16/18. 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    PT Comments    Pt motivated and progressing well with mobility.   Follow Up Recommendations  Follow surgeon's recommendation for DC plan and follow-up therapies;Supervision for mobility/OOB     Equipment Recommendations  None recommended by PT    Recommendations for Other Services       Precautions / Restrictions Precautions Precautions: Fall;Knee Restrictions Weight Bearing Restrictions: No Other Position/Activity Restrictions: WBAT     Mobility  Bed Mobility Overal bed mobility: Needs Assistance Bed Mobility: Supine to Sit     Supine to sit: Min guard     General bed mobility comments: min cues for sequence and use of R LE to self assist  Transfers Overall transfer level: Needs assistance Equipment used: Rolling walker (2 wheeled) Transfers: Sit to/from Stand Sit to Stand: Min guard         General transfer comment: cues for LE management and use of UEs to self assist  Ambulation/Gait Ambulation/Gait assistance: Min guard Gait Distance (Feet): 150 Feet Assistive device: Rolling walker (2 wheeled) Gait Pattern/deviations: Step-to pattern;Decreased stride length;Decreased stance time - left;Decreased weight shift to left;Antalgic Gait velocity: decr        Stairs             Wheelchair Mobility    Modified Rankin (Stroke Patients Only)       Balance Overall balance assessment: Mild deficits observed, not formally tested                                          Cognition Arousal/Alertness: Awake/alert Behavior During Therapy: WFL for tasks assessed/performed Overall Cognitive Status: Within Functional Limits for tasks assessed                                        Exercises Total Joint Exercises Ankle Circles/Pumps: AROM;Both;Supine;15 reps Quad Sets: AROM;Left;Supine;10 reps Heel Slides: Left;Supine;AAROM;10 reps Straight Leg Raises: AAROM;AROM;Left;10 reps;Supine    General Comments        Pertinent Vitals/Pain Pain Assessment: 0-10 Pain Score: 4  Pain Location: L knee  Pain Descriptors / Indicators: Aching Pain Intervention(s): Limited activity within patient's tolerance;Monitored during session;Premedicated before session;Ice applied    Home Living                      Prior Function            PT Goals (current goals can now be found in the care plan section) Acute Rehab PT Goals Patient Stated Goal: Regain IND PT Goal Formulation: With patient Time For Goal Achievement: 04/23/18 Potential to Achieve Goals: Good Progress towards PT goals: Progressing toward goals    Frequency    7X/week      PT Plan Current plan remains appropriate    Co-evaluation              AM-PAC PT "6 Clicks" Daily Activity  Outcome Measure  Difficulty turning over in bed (including adjusting bedclothes, sheets and blankets)?: A Lot Difficulty moving from lying on back to sitting on  the side of the bed? : A Lot Difficulty sitting down on and standing up from a chair with arms (e.g., wheelchair, bedside commode, etc,.)?: A Lot Help needed moving to and from a bed to chair (including a wheelchair)?: A Little Help needed walking in hospital room?: A Little Help needed climbing 3-5 steps with a railing? : A Little 6 Click Score: 15    End of Session Equipment Utilized During Treatment: Gait belt Activity Tolerance: Patient tolerated treatment well Patient left: in chair;with call bell/phone within reach Nurse Communication: Mobility status PT Visit Diagnosis: Other abnormalities of gait and mobility (R26.89);Difficulty in walking, not elsewhere classified (R26.2)     Time: 3845-3646 PT Time  Calculation (min) (ACUTE ONLY): 31 min  Charges:  $Gait Training: 8-22 mins $Therapeutic Exercise: 8-22 mins                     [place] Pager [phone] Office [phone]    Avi Kerschner 04/17/2018, 12:31 PM

## 2018-04-17 NOTE — Discharge Instructions (Signed)

## 2018-04-20 DIAGNOSIS — M25669 Stiffness of unspecified knee, not elsewhere classified: Secondary | ICD-10-CM | POA: Insufficient documentation

## 2018-04-20 NOTE — Discharge Summary (Signed)
Physician Discharge Summary  Patient ID: Emma Swanson MRN: [id number] DOB/AGE: 10/27/1940 77 y.o.  Admit date: 04/16/2018 Discharge date: 04/17/2018   Procedures:  Procedure(s) (LRB): LEFT TOTAL KNEE ARTHROPLASTY (Left)  Attending Physician:  Dr. Paralee Cancel   Admission Diagnoses:   Left knee primary OA / pain  Discharge Diagnoses:  Principal Problem:   S/P left TKA  Past Medical History:  Diagnosis Date  . Arthritis   . Cancer ([place])    hx of skin cancers   . Family history of adverse reaction to anesthesia    see malignant hyperthermia tab   . GERD (gastroesophageal reflux disease)   . Hypertension   . Malignant hyperthermia    cousin's son- - malignant hyperthermia al most died  ,  patient's brother and his 2 sons tested positive for malignant hyperthermia  . PONV (postoperative nausea and vomiting)     HPI:    Emma Swanson, 77 y.o. female, has a history of pain and functional disability in the left knee due to arthritis and has failed non-surgical conservative treatments for greater than 12 weeks to includeNSAID's and/or analgesics, corticosteriod injections, viscosupplementation injections and activity modification.  Onset of symptoms was gradual, starting >10 years ago with gradually worsening course since that time. The patient noted prior procedures on the knee to include  arthroscopy on the left knee(s).  Patient currently rates pain in the left knee(s) at 7 out of 10 with activity. Patient has worsening of pain with activity and weight bearing, pain that interferes with activities of daily living, pain with passive range of motion, crepitus and joint swelling.  Patient has evidence of periarticular osteophytes and joint space narrowing by imaging studies.  There is no active infection.  Risks, benefits and expectations were discussed with the patient.  Risks including but not limited to the risk of anesthesia, blood clots, nerve damage, blood vessel damage, failure  of the prosthesis, infection and up to and including death.  Patient understand the risks, benefits and expectations and wishes to proceed with surgery.   PCP: Sandi Mariscal, MD   Discharged Condition: good  Hospital Course:  Patient underwent the above stated procedure on 04/16/2018. Patient tolerated the procedure well and brought to the recovery room in good condition and subsequently to the floor.  POD #1 BP: 118/72 ; Pulse: 62 ; Temp: 97.7 F (36.5 C) ; Resp: 16 Patient reports pain as mild, pain controlled well with Norco.  No events throughout the night. Had the right TKA 4 months ago and recovered well from that one. Looking forward to a similar recovery with the right knee.  Dorsiflexion/plantar flexion intact, incision: dressing C/D/I, no cellulitis present and compartment soft.   LABS  Basename    HGB     11.1  HCT     32.8    Discharge Exam: General appearance: alert, cooperative and no distress Extremities: Homans sign is negative, no sign of DVT, no edema, redness or tenderness in the calves or thighs and no ulcers, gangrene or trophic changes  Disposition:  Home with follow up in 2 weeks   Follow-up Information    Paralee Cancel, MD. Schedule an appointment as soon as possible for a visit in 2 weeks.   Specialty:  Orthopedic Surgery Contact information: [address] [place] [address] [phone]           Discharge Instructions    Call MD / Call 911   Complete by:  As directed  If you experience chest pain or shortness of breath, CALL 911 and be transported to the hospital emergency room.  If you develope a fever above 101 F, pus (white drainage) or increased drainage or redness at the wound, or calf pain, call your surgeon's office.   Change dressing   Complete by:  As directed    Maintain surgical dressing until follow up in the clinic. If the edges start to pull up, may reinforce with tape. If the dressing is no longer working, may  remove and cover with gauze and tape, but must keep the area dry and clean.  Call with any questions or concerns.   Constipation Prevention   Complete by:  As directed    Drink plenty of fluids.  Prune juice may be helpful.  You may use a stool softener, such as Colace (over the counter) 100 mg twice a day.  Use MiraLax (over the counter) for constipation as needed.   Diet - low sodium heart healthy   Complete by:  As directed    Discharge instructions   Complete by:  As directed    Maintain surgical dressing until follow up in the clinic. If the edges start to pull up, may reinforce with tape. If the dressing is no longer working, may remove and cover with gauze and tape, but must keep the area dry and clean.  Follow up in 2 weeks at [facility]. Call with any questions or concerns.   Increase activity slowly as tolerated   Complete by:  As directed    Weight bearing as tolerated with assist device (walker, cane, etc) as directed, use it as long as suggested by your surgeon or therapist, typically at least 4-6 weeks.   TED hose   Complete by:  As directed    Use stockings (TED hose) for 2 weeks on both leg(s).  You may remove them at night for sleeping.      Allergies as of 04/17/2018      Reactions   Other    general anesthesia - pt's family has history of malignant hyperthermia   Oxycodone    Hallucinations    Penicillins    Childhood reaction Has patient had a PCN reaction causing immediate rash, facial/tongue/throat swelling, SOB or lightheadedness with hypotension: Unknown Has patient had a PCN reaction causing severe rash involving mucus membranes or skin necrosis: Unknown Has patient had a PCN reaction that required hospitalization: Unknown Has patient had a PCN reaction occurring within the last 10 years: No If all of the above answers are "NO", then may proceed with Cephalosporin use.      Medication List    STOP taking these medications   ibuprofen 200 MG  tablet Commonly known as:  ADVIL,MOTRIN     TAKE these medications   aspirin 81 MG chewable tablet Chew 1 tablet (81 mg total) by mouth 2 (two) times daily. Take for 4 weeks, then resume regular dose.   B-12 5000 MCG Caps Take 5,000 mcg by mouth daily.   budesonide 3 MG 24 hr capsule Commonly known as:  ENTOCORT EC Take 3 mg by mouth daily.   CALCIUM 600+D PO Take 1 tablet by mouth daily.   celecoxib 200 MG capsule Commonly known as:  CELEBREX Take 1 capsule (200 mg total) by mouth 2 (two) times daily.   clobetasol ointment 0.05 % Commonly known as:  TEMOVATE Apply 1 application topically daily as needed (irritation).   docusate sodium 100 MG capsule Commonly known  as:  COLACE Take 1 capsule (100 mg total) by mouth 2 (two) times daily.   ferrous sulfate 325 (65 FE) MG tablet Take 1 tablet (325 mg total) by mouth 3 (three) times daily with meals.   HYALURONIC ACID PO Take 300 mg by mouth daily.   HYDROcodone-acetaminophen 7.5-325 MG tablet Commonly known as:  NORCO Take 1-2 tablets by mouth every 4 (four) hours as needed for moderate pain.   ipratropium 0.06 % nasal spray Commonly known as:  ATROVENT Place 4 sprays into both nostrils daily.   lisinopril-hydrochlorothiazide 10-12.5 MG tablet Commonly known as:  PRINZIDE,ZESTORETIC Take 1 tablet by mouth daily.   LUTEIN-ZEAXANTHIN PO Take 1 tablet by mouth daily.   methocarbamol 500 MG tablet Commonly known as:  ROBAXIN Take 1 tablet (500 mg total) by mouth every 6 (six) hours as needed for muscle spasms.   multivitamin with minerals Tabs tablet Take 1 tablet by mouth daily.   polyethylene glycol packet Commonly known as:  MIRALAX / GLYCOLAX Take 17 g by mouth 2 (two) times daily.   VISINE OP Place 1 drop into both eyes daily as needed (dry eyes).   Vitamin D3 10000 units capsule Take 10,000 Units by mouth daily.   vitamin E 400 UNIT capsule Take 400 Units by mouth daily.   zolpidem 10 MG  tablet Commonly known as:  AMBIEN Take 10 mg by mouth at bedtime.            Discharge Care Instructions  (From admission, onward)         Start     Ordered   04/17/18 0000  Change dressing    Comments:  Maintain surgical dressing until follow up in the clinic. If the edges start to pull up, may reinforce with tape. If the dressing is no longer working, may remove and cover with gauze and tape, but must keep the area dry and clean.  Call with any questions or concerns.   04/17/18 [id number]           Signed: [place]. Charnetta Wulff   PA-C  04/20/2018, 11:39 PM

## 2018-05-26 DIAGNOSIS — M19049 Primary osteoarthritis, unspecified hand: Secondary | ICD-10-CM | POA: Insufficient documentation

## 2019-04-26 ENCOUNTER — Other Ambulatory Visit: Payer: Self-pay | Admitting: *Deleted

## 2019-04-26 DIAGNOSIS — Z20822 Contact with and (suspected) exposure to covid-19: Secondary | ICD-10-CM

## 2019-04-28 LAB — NOVEL CORONAVIRUS, NAA: SARS-CoV-2, NAA: NOT DETECTED

## 2019-06-21 ENCOUNTER — Other Ambulatory Visit: Payer: Self-pay

## 2019-06-21 DIAGNOSIS — Z20822 Contact with and (suspected) exposure to covid-19: Secondary | ICD-10-CM

## 2019-06-22 LAB — NOVEL CORONAVIRUS, NAA: SARS-CoV-2, NAA: NOT DETECTED

## 2019-07-27 ENCOUNTER — Ambulatory Visit: Payer: BC Managed Care – PPO | Attending: Internal Medicine

## 2019-07-27 DIAGNOSIS — Z20822 Contact with and (suspected) exposure to covid-19: Secondary | ICD-10-CM

## 2019-07-29 LAB — NOVEL CORONAVIRUS, NAA: SARS-CoV-2, NAA: NOT DETECTED

## 2019-11-22 DIAGNOSIS — H524 Presbyopia: Secondary | ICD-10-CM | POA: Diagnosis not present

## 2019-11-22 DIAGNOSIS — H35013 Changes in retinal vascular appearance, bilateral: Secondary | ICD-10-CM | POA: Diagnosis not present

## 2019-11-22 DIAGNOSIS — H35033 Hypertensive retinopathy, bilateral: Secondary | ICD-10-CM | POA: Diagnosis not present

## 2019-11-22 DIAGNOSIS — H2513 Age-related nuclear cataract, bilateral: Secondary | ICD-10-CM | POA: Diagnosis not present

## 2019-11-22 DIAGNOSIS — H35363 Drusen (degenerative) of macula, bilateral: Secondary | ICD-10-CM | POA: Diagnosis not present

## 2019-12-22 DIAGNOSIS — M19012 Primary osteoarthritis, left shoulder: Secondary | ICD-10-CM | POA: Diagnosis not present

## 2019-12-22 DIAGNOSIS — M25512 Pain in left shoulder: Secondary | ICD-10-CM | POA: Diagnosis not present

## 2019-12-23 DIAGNOSIS — Z79899 Other long term (current) drug therapy: Secondary | ICD-10-CM | POA: Diagnosis not present

## 2019-12-23 DIAGNOSIS — E781 Pure hyperglyceridemia: Secondary | ICD-10-CM | POA: Diagnosis not present

## 2019-12-23 DIAGNOSIS — G4700 Insomnia, unspecified: Secondary | ICD-10-CM | POA: Diagnosis not present

## 2019-12-23 DIAGNOSIS — I10 Essential (primary) hypertension: Secondary | ICD-10-CM | POA: Diagnosis not present

## 2020-01-01 DIAGNOSIS — M25512 Pain in left shoulder: Secondary | ICD-10-CM | POA: Diagnosis not present

## 2020-01-04 ENCOUNTER — Other Ambulatory Visit: Payer: Self-pay | Admitting: Orthopedic Surgery

## 2020-01-04 DIAGNOSIS — M19012 Primary osteoarthritis, left shoulder: Secondary | ICD-10-CM

## 2020-01-28 ENCOUNTER — Ambulatory Visit
Admission: RE | Admit: 2020-01-28 | Discharge: 2020-01-28 | Disposition: A | Discharge status: Home / Self Care | Payer: BC Managed Care – PPO | Source: Ambulatory Visit | Attending: Orthopedic Surgery | Admitting: Orthopedic Surgery

## 2020-01-28 DIAGNOSIS — M19012 Primary osteoarthritis, left shoulder: Secondary | ICD-10-CM

## 2020-01-28 DIAGNOSIS — Z01818 Encounter for other preprocedural examination: Secondary | ICD-10-CM | POA: Diagnosis not present

## 2020-02-01 NOTE — Patient Instructions (Addendum)
DUE TO COVID-19 ONLY ONE VISITOR IS ALLOWED TO COME WITH YOU AND STAY IN THE WAITING ROOM ONLY DURING PRE OP AND PROCEDURE DAY OF SURGERY. THE 1 VISITOR MAY VISIT WITH YOU AFTER SURGERY IN YOUR PRIVATE ROOM DURING VISITING HOURS ONLY!  YOU NEED TO HAVE A COVID 19 TEST ON: 02/07/20 @ 11:00 am, THIS TEST MUST BE DONE BEFORE SURGERY, COME  [place], [place] [address] , [address].  ([place]) ONCE YOUR COVID TEST IS COMPLETED, PLEASE BEGIN THE QUARANTINE INSTRUCTIONS AS OUTLINED IN YOUR HANDOUT.                Emma Swanson    Your procedure is scheduled on: 02/10/20   Report to [facility] Main  Entrance   Report to short stay at: 5:30 AM     Call this number if you have problems the morning of surgery [phone]    Remember:   NO SOLID FOOD AFTER MIDNIGHT THE NIGHT PRIOR TO SURGERY. NOTHING BY MOUTH EXCEPT CLEAR LIQUIDS UNTIL: 4:30 am . PLEASE FINISH ENSURE DRINK PER SURGEON ORDER  WHICH NEEDS TO BE COMPLETED AT : 4:30 am.   CLEAR LIQUID DIET   Foods Allowed                                                                     Foods Excluded  Coffee and tea, regular and decaf                             liquids that you cannot  Plain Jell-O any favor except red or purple                                           see through such as: Fruit ices (not with fruit pulp)                                     milk, soups, orange juice  Iced Popsicles                                    All solid food Carbonated beverages, regular and diet                                    Cranberry, grape and apple juices Sports drinks like Gatorade Lightly seasoned clear broth or consume(fat free) Sugar, honey syrup  Sample Menu Breakfast                                Lunch                                     Supper Cranberry juice  Beef broth                            Chicken broth Jell-O                                     Grape juice                            Apple juice Coffee or tea                        Jell-O                                      Popsicle                                                Coffee or tea                        Coffee or tea  _____________________________________________________________________  BRUSH YOUR TEETH MORNING OF SURGERY AND RINSE YOUR MOUTH OUT, NO CHEWING GUM CANDY OR MINTS.                                  You may not have any metal on your body including hair pins and              piercings  Do not wear jewelry, make-up, lotions, powders or perfumes, deodorant             Do not wear nail polish on your fingernails.  Do not shave  48 hours prior to surgery.        Do not bring valuables to the hospital. [place].  Contacts, dentures or bridgework may not be worn into surgery.  Leave suitcase in the car. After surgery it may be brought to your room.     Patients discharged the day of surgery will not be allowed to drive home. IF YOU ARE HAVING SURGERY AND GOING HOME THE SAME DAY, YOU MUST HAVE AN ADULT TO DRIVE YOU HOME AND BE WITH YOU FOR 24 HOURS. YOU MAY GO HOME BY TAXI OR UBER OR ORTHERWISE, BUT AN ADULT MUST ACCOMPANY YOU HOME AND STAY WITH YOU FOR 24 HOURS.  Name and phone number of your driver:  Special Instructions: N/A              Please read over the following fact sheets you were given: _____________________________________________________________________        [facility]- Preparing for Total Shoulder Arthroplasty    Before surgery, you can play an important role. Because skin is not sterile, your skin needs to be as free of germs as possible. You can reduce the number of germs on your skin by using the following products.  Benzoyl Peroxide Gel o Reduces the number of germs present on the skin o Applied twice a  day to shoulder area starting two days before surgery     ==================================================================  Please follow these instructions carefully:  BENZOYL PEROXIDE 5% GEL  Please do not use if you have an allergy to benzoyl peroxide.   If your skin becomes reddened/irritated stop using the benzoyl peroxide.  Starting two days before surgery, apply as follows: 1. Apply benzoyl peroxide in the morning and at night. Apply after taking a shower. If you are not taking a shower clean entire shoulder front, back, and side along with the armpit with a clean wet washcloth.  2. Place a quarter-sized dollop on your shoulder and rub in thoroughly, making sure to cover the front, back, and side of your shoulder, along with the armpit.   2 days before ____ AM   ____ PM              1 day before ____ AM   ____ PM                         3. Do this twice a day for two days.  (Last application is the night before surgery, AFTER using the CHG soap as described below).  4. Do NOT apply benzoyl peroxide gel on the day of surgery.    [facility] - Preparing for Surgery Before surgery, you can play an important role.  Because skin is not sterile, your skin needs to be as free of germs as possible.  You can reduce the number of germs on your skin by washing with CHG (chlorahexidine gluconate) soap before surgery.  CHG is an antiseptic cleaner which kills germs and bonds with the skin to continue killing germs even after washing. Please DO NOT use if you have an allergy to CHG or antibacterial soaps.  If your skin becomes reddened/irritated stop using the CHG and inform your nurse when you arrive at Short Stay. Do not shave (including legs and underarms) for at least 48 hours prior to the first CHG shower.  You may shave your face/neck. Please follow these instructions carefully:  1.  Shower with CHG Soap the night before surgery and the  morning of Surgery.  2.  If you choose to wash your hair, wash your hair first as usual with your  normal   shampoo.  3.  After you shampoo, rinse your hair and body thoroughly to remove the  shampoo.                           4.  Use CHG as you would any other liquid soap.  You can apply chg directly  to the skin and wash                       Gently with a scrungie or clean washcloth.  5.  Apply the CHG Soap to your body ONLY FROM THE NECK DOWN.   Do not use on face/ open                           Wound or open sores. Avoid contact with eyes, ears mouth and genitals (private parts).                       Wash face,  Genitals (private parts) with your normal soap.  6.  Wash thoroughly, paying special attention to the area where your surgery  will be performed.  7.  Thoroughly rinse your body with warm water from the neck down.  8.  DO NOT shower/wash with your normal soap after using and rinsing off  the CHG Soap.                9.  Pat yourself dry with a clean towel.            10.  Wear clean pajamas.            11.  Place clean sheets on your bed the night of your first shower and do not  sleep with pets. Day of Surgery : Do not apply any lotions/deodorants the morning of surgery.  Please wear clean clothes to the hospital/surgery center.  FAILURE TO FOLLOW THESE INSTRUCTIONS MAY RESULT IN THE CANCELLATION OF YOUR SURGERY PATIENT SIGNATURE_________________________________  NURSE SIGNATURE__________________________________  ________________________________________________________________________   Emma Swanson  An incentive spirometer is a tool that can help keep your lungs clear and active. This tool measures how well you are filling your lungs with each breath. Taking long deep breaths may help reverse or decrease the chance of developing breathing (pulmonary) problems (especially infection) following:  A long period of time when you are unable to move or be active. BEFORE THE PROCEDURE   If the spirometer includes an indicator to show your best effort, your nurse or  respiratory therapist will set it to a desired goal.  If possible, sit up straight or lean slightly forward. Try not to slouch.  Hold the incentive spirometer in an upright position. INSTRUCTIONS FOR USE  1. Sit on the edge of your bed if possible, or sit up as far as you can in bed or on a chair. 2. Hold the incentive spirometer in an upright position. 3. Breathe out normally. 4. Place the mouthpiece in your mouth and seal your lips tightly around it. 5. Breathe in slowly and as deeply as possible, raising the piston or the ball toward the top of the column. 6. Hold your breath for 3-5 seconds or for as long as possible. Allow the piston or ball to fall to the bottom of the column. 7. Remove the mouthpiece from your mouth and breathe out normally. 8. Rest for a few seconds and repeat Steps 1 through 7 at least 10 times every 1-2 hours when you are awake. Take your time and take a few normal breaths between deep breaths. 9. The spirometer may include an indicator to show your best effort. Use the indicator as a goal to work toward during each repetition. 10. After each set of 10 deep breaths, practice coughing to be sure your lungs are clear. If you have an incision (the cut made at the time of surgery), support your incision when coughing by placing a pillow or rolled up towels firmly against it. Once you are able to get out of bed, walk around indoors and cough well. You may stop using the incentive spirometer when instructed by your caregiver.  RISKS AND COMPLICATIONS  Take your time so you do not get dizzy or light-headed.  If you are in pain, you may need to take or ask for pain medication before doing incentive spirometry. It is harder to take a deep breath if you are having pain. AFTER USE  Rest and breathe slowly and easily.  It can be helpful to keep track of a log of your  progress. Your caregiver can provide you with a simple table to help with this. If you are using the  spirometer at home, follow these instructions: [place] IF:   You are having difficultly using the spirometer.  You have trouble using the spirometer as often as instructed.  Your pain medication is not giving enough relief while using the spirometer.  You develop fever of 100.5 F (38.1 C) or higher. SEEK IMMEDIATE MEDICAL CARE IF:   You cough up bloody sputum that had not been present before.  You develop fever of 102 F (38.9 C) or greater.  You develop worsening pain at or near the incision site. MAKE SURE YOU:   Understand these instructions.  Will watch your condition.  Will get help right away if you are not doing well or get worse. Document Released: 11/18/2006 Document Revised: 09/30/2011 Document Reviewed: 01/19/2007 [facility] Patient Information 2014 [place], [place].   ________________________________________________________________________

## 2020-02-02 ENCOUNTER — Other Ambulatory Visit: Payer: Self-pay

## 2020-02-02 ENCOUNTER — Encounter (HOSPITAL_COMMUNITY): Payer: Self-pay

## 2020-02-02 ENCOUNTER — Encounter (HOSPITAL_COMMUNITY)
Admission: RE | Admit: 2020-02-02 | Discharge: 2020-02-02 | Disposition: A | Discharge status: Home / Self Care | Payer: BC Managed Care – PPO | Source: Ambulatory Visit | Attending: Orthopedic Surgery | Admitting: Orthopedic Surgery

## 2020-02-02 DIAGNOSIS — Z01818 Encounter for other preprocedural examination: Secondary | ICD-10-CM | POA: Insufficient documentation

## 2020-02-02 HISTORY — DX: Palpitations: R00.2

## 2020-02-02 HISTORY — DX: Anxiety disorder, unspecified: F41.9

## 2020-02-02 HISTORY — DX: Hypothyroidism, unspecified: E03.9

## 2020-02-02 LAB — CBC
HCT: 43.8 % (ref 36.0–46.0)
Hemoglobin: 14.5 g/dL (ref 12.0–15.0)
MCH: 31.4 pg (ref 26.0–34.0)
MCHC: 33.1 g/dL (ref 30.0–36.0)
MCV: 94.8 fL (ref 80.0–100.0)
Platelets: 278 K/uL (ref 150–400)
RBC: 4.62 MIL/uL (ref 3.87–5.11)
RDW: 12.9 % (ref 11.5–15.5)
WBC: 4.5 K/uL (ref 4.0–10.5)
nRBC: 0 % (ref 0.0–0.2)

## 2020-02-02 LAB — BASIC METABOLIC PANEL
Anion gap: 10 (ref 5–15)
BUN: 18 mg/dL (ref 8–23)
CO2: 27 mmol/L (ref 22–32)
Calcium: 9.5 mg/dL (ref 8.9–10.3)
Chloride: 100 mmol/L (ref 98–111)
Creatinine, Ser: 0.78 mg/dL (ref 0.44–1.00)
GFR calc Af Amer: >60 mL/min (ref >60)
GFR calc non Af Amer: >60 mL/min (ref >60)
Glucose, Bld: 92 mg/dL (ref 70–99)
Potassium: 4.2 mmol/L (ref 3.5–5.1)
Sodium: 137 mmol/L (ref 135–145)

## 2020-02-02 LAB — SURGICAL PCR SCREEN
MRSA, PCR: NEGATIVE
Staphylococcus aureus: NEGATIVE

## 2020-02-02 NOTE — Progress Notes (Signed)
COVID Vaccine Completed:yes Date COVID Vaccine completed:08/2019 COVID vaccine manufacturer: *Pfizer    Moderna   [employer]   PCP - Dr.  Sandi Mariscal Cardiologist -   Chest x-ray -  EKG -  Stress Test -  ECHO -  Cardiac Cath -   Sleep Study -  CPAP -   Fasting Blood Sugar -  Checks Blood Sugar _____ times a day  Blood Thinner Instructions: Aspirin Instructions: Last Dose:  Anesthesia review:   Patient denies shortness of breath, fever, cough and chest pain at PAT appointment   Patient verbalized understanding of instructions that were given to them at the PAT appointment. Patient was also instructed that they will need to review over the PAT instructions again at home before surgery.

## 2020-02-08 ENCOUNTER — Other Ambulatory Visit (HOSPITAL_COMMUNITY)
Admission: RE | Admit: 2020-02-08 | Discharge: 2020-02-08 | Disposition: A | Discharge status: Home / Self Care | Payer: BC Managed Care – PPO | Source: Ambulatory Visit | Attending: Orthopedic Surgery | Admitting: Orthopedic Surgery

## 2020-02-08 DIAGNOSIS — Z20822 Contact with and (suspected) exposure to covid-19: Secondary | ICD-10-CM | POA: Insufficient documentation

## 2020-02-08 DIAGNOSIS — Z01812 Encounter for preprocedural laboratory examination: Secondary | ICD-10-CM | POA: Insufficient documentation

## 2020-02-08 LAB — SARS CORONAVIRUS 2 (TAT 6-24 HRS): SARS Coronavirus 2: NEGATIVE

## 2020-02-09 ENCOUNTER — Encounter (HOSPITAL_COMMUNITY): Payer: Self-pay | Admitting: Orthopedic Surgery

## 2020-02-09 NOTE — Anesthesia Preprocedure Evaluation (Addendum)
Anesthesia Evaluation  Patient identified by MRN, date of birth, ID band Patient awake    Reviewed: Allergy & Precautions, NPO status , Patient's Chart, lab work & pertinent test results  History of Anesthesia Complications (+) PONV and MALIGNANT HYPERTHERMIA  Airway Mallampati: II  TM Distance: >3 FB Neck ROM: Full    Dental no notable dental hx. (+) Teeth Intact, Dental Advisory Given   Pulmonary former smoker,    Pulmonary exam normal breath sounds clear to auscultation       Cardiovascular hypertension, Pt. on medications Normal cardiovascular exam Rhythm:Regular Rate:Normal     Neuro/Psych Anxiety negative neurological ROS     GI/Hepatic Neg liver ROS, GERD  ,  Endo/Other  negative endocrine ROS  Renal/GU negative Renal ROS     Musculoskeletal   Abdominal   Peds  Hematology   Anesthesia Other Findings   Reproductive/Obstetrics                            Anesthesia Physical Anesthesia Plan  ASA: II  Anesthesia Plan: General   Post-op Pain Management:  Regional for Post-op pain   Induction: Intravenous  PONV Risk Score and Plan: 4 or greater and TIVA, Treatment may vary due to age or medical condition, Ondansetron and Dexamethasone  Airway Management Planned: LMA  Additional Equipment: None  Intra-op Plan:   Post-operative Plan:   Informed Consent: I have reviewed the patients History and Physical, chart, labs and discussed the procedure including the risks, benefits and alternatives for the proposed anesthesia with the patient or authorized representative who has indicated his/her understanding and acceptance.     Dental advisory given  Plan Discussed with: CRNA and Surgeon  Anesthesia Plan Comments: (TIVA w L Adductor canal block (w exparel) + dexmedetomidine 0.46mcg/kg)     Anesthesia Quick Evaluation

## 2020-02-10 ENCOUNTER — Encounter (HOSPITAL_COMMUNITY)
Admission: RE | Disposition: A | Discharge status: Home / Self Care | Payer: Self-pay | Source: Home / Self Care | Attending: Orthopedic Surgery

## 2020-02-10 ENCOUNTER — Encounter (HOSPITAL_COMMUNITY): Payer: Self-pay | Admitting: Orthopedic Surgery

## 2020-02-10 ENCOUNTER — Other Ambulatory Visit: Payer: Self-pay

## 2020-02-10 ENCOUNTER — Ambulatory Visit (HOSPITAL_COMMUNITY)
Admission: RE | Admit: 2020-02-10 | Discharge: 2020-02-11 | Disposition: A | Discharge status: Home / Self Care | Payer: BC Managed Care – PPO | Attending: Orthopedic Surgery | Admitting: Orthopedic Surgery

## 2020-02-10 ENCOUNTER — Ambulatory Visit (HOSPITAL_COMMUNITY): Payer: BC Managed Care – PPO | Admitting: Anesthesiology

## 2020-02-10 DIAGNOSIS — G8918 Other acute postprocedural pain: Secondary | ICD-10-CM | POA: Diagnosis not present

## 2020-02-10 DIAGNOSIS — K219 Gastro-esophageal reflux disease without esophagitis: Secondary | ICD-10-CM | POA: Diagnosis not present

## 2020-02-10 DIAGNOSIS — Z79899 Other long term (current) drug therapy: Secondary | ICD-10-CM | POA: Diagnosis not present

## 2020-02-10 DIAGNOSIS — M19012 Primary osteoarthritis, left shoulder: Secondary | ICD-10-CM | POA: Insufficient documentation

## 2020-02-10 DIAGNOSIS — Z96612 Presence of left artificial shoulder joint: Secondary | ICD-10-CM | POA: Diagnosis not present

## 2020-02-10 DIAGNOSIS — Z85828 Personal history of other malignant neoplasm of skin: Secondary | ICD-10-CM | POA: Diagnosis not present

## 2020-02-10 DIAGNOSIS — F419 Anxiety disorder, unspecified: Secondary | ICD-10-CM | POA: Insufficient documentation

## 2020-02-10 DIAGNOSIS — I10 Essential (primary) hypertension: Secondary | ICD-10-CM | POA: Diagnosis not present

## 2020-02-10 DIAGNOSIS — E039 Hypothyroidism, unspecified: Secondary | ICD-10-CM | POA: Insufficient documentation

## 2020-02-10 HISTORY — PX: TOTAL SHOULDER ARTHROPLASTY: SHX126

## 2020-02-10 SURGERY — ARTHROPLASTY, SHOULDER, TOTAL
Anesthesia: General | Site: Shoulder | Laterality: Left

## 2020-02-10 MED ORDER — FENTANYL CITRATE (PF) 100 MCG/2ML IJ SOLN
INTRAMUSCULAR | Status: AC
  Filled 2020-02-10: qty 2

## 2020-02-10 MED ORDER — ZOLPIDEM TARTRATE 5 MG PO TABS
5.0000 mg | ORAL_TABLET | Freq: Every evening | ORAL | Status: DC | PRN
  Administered 2020-02-10: 5 mg via ORAL
  Filled 2020-02-10: qty 1

## 2020-02-10 MED ORDER — PROPOFOL 10 MG/ML IV BOLUS
INTRAVENOUS | Status: DC | PRN
  Administered 2020-02-10: 140 mg via INTRAVENOUS

## 2020-02-10 MED ORDER — ORAL CARE MOUTH RINSE: 15.0000 mL | Freq: Once | OROMUCOSAL | Status: AC

## 2020-02-10 MED ORDER — CHLORHEXIDINE GLUCONATE 0.12 % MT SOLN
15.0000 mL | Freq: Once | OROMUCOSAL | Status: AC
  Administered 2020-02-10: 15 mL via OROMUCOSAL

## 2020-02-10 MED ORDER — POLYETHYLENE GLYCOL 3350 17 G PO PACK: 17.0000 g | PACK | Freq: Every day | ORAL | Status: DC | PRN

## 2020-02-10 MED ORDER — MENTHOL 3 MG MT LOZG: 1.0000 | LOZENGE | OROMUCOSAL | Status: DC | PRN

## 2020-02-10 MED ORDER — BUPIVACAINE LIPOSOME 1.3 % IJ SUSP
INTRAMUSCULAR | Status: DC | PRN
  Administered 2020-02-10: 10 mL via PERINEURAL

## 2020-02-10 MED ORDER — PROPOFOL 1000 MG/100ML IV EMUL
INTRAVENOUS | Status: AC
  Filled 2020-02-10: qty 200

## 2020-02-10 MED ORDER — ROCURONIUM BROMIDE 10 MG/ML (PF) SYRINGE
PREFILLED_SYRINGE | INTRAVENOUS | Status: DC | PRN
  Administered 2020-02-10: 10 mg via INTRAVENOUS
  Administered 2020-02-10: 50 mg via INTRAVENOUS

## 2020-02-10 MED ORDER — LACTATED RINGERS IV SOLN: INTRAVENOUS | Status: DC

## 2020-02-10 MED ORDER — TRANEXAMIC ACID-NACL 1000-0.7 MG/100ML-% IV SOLN
1000.0000 mg | INTRAVENOUS | Status: AC
  Administered 2020-02-10: 1000 mg via INTRAVENOUS
  Filled 2020-02-10: qty 100

## 2020-02-10 MED ORDER — ONDANSETRON HCL 4 MG/2ML IJ SOLN
INTRAMUSCULAR | Status: AC
  Filled 2020-02-10: qty 6

## 2020-02-10 MED ORDER — ONDANSETRON HCL 4 MG/2ML IJ SOLN: 4.0000 mg | Freq: Four times a day (QID) | INTRAMUSCULAR | Status: DC | PRN

## 2020-02-10 MED ORDER — FENTANYL CITRATE (PF) 100 MCG/2ML IJ SOLN
INTRAMUSCULAR | Status: DC | PRN
  Administered 2020-02-10 (×2): 25 ug via INTRAVENOUS
  Administered 2020-02-10: 50 ug via INTRAVENOUS

## 2020-02-10 MED ORDER — DOCUSATE SODIUM 100 MG PO CAPS
100.0000 mg | ORAL_CAPSULE | Freq: Two times a day (BID) | ORAL | Status: DC
  Administered 2020-02-11: 100 mg via ORAL
  Filled 2020-02-10 (×2): qty 1

## 2020-02-10 MED ORDER — HYDROCODONE-ACETAMINOPHEN 5-325 MG PO TABS: 1.0000 | ORAL_TABLET | ORAL | Status: DC | PRN

## 2020-02-10 MED ORDER — LIDOCAINE 2% (20 MG/ML) 5 ML SYRINGE
INTRAMUSCULAR | Status: DC | PRN
  Administered 2020-02-10: 50 mg via INTRAVENOUS

## 2020-02-10 MED ORDER — ROCURONIUM BROMIDE 10 MG/ML (PF) SYRINGE
PREFILLED_SYRINGE | INTRAVENOUS | Status: AC
  Filled 2020-02-10: qty 30

## 2020-02-10 MED ORDER — FENTANYL CITRATE (PF) 100 MCG/2ML IJ SOLN: 25.0000 ug | INTRAMUSCULAR | Status: DC | PRN

## 2020-02-10 MED ORDER — KETOROLAC TROMETHAMINE 15 MG/ML IJ SOLN
7.5000 mg | Freq: Four times a day (QID) | INTRAMUSCULAR | Status: AC
  Administered 2020-02-10 – 2020-02-11 (×4): 7.5 mg via INTRAVENOUS
  Filled 2020-02-10 (×4): qty 1

## 2020-02-10 MED ORDER — PANTOPRAZOLE SODIUM 40 MG PO TBEC
40.0000 mg | DELAYED_RELEASE_TABLET | Freq: Every day | ORAL | Status: DC
  Administered 2020-02-10 – 2020-02-11 (×2): 40 mg via ORAL
  Filled 2020-02-10 (×2): qty 1

## 2020-02-10 MED ORDER — ALUM & MAG HYDROXIDE-SIMETH 200-200-20 MG/5ML PO SUSP: 30.0000 mL | ORAL | Status: DC | PRN

## 2020-02-10 MED ORDER — ONDANSETRON HCL 4 MG/2ML IJ SOLN
INTRAMUSCULAR | Status: DC | PRN
  Administered 2020-02-10: 4 mg via INTRAVENOUS

## 2020-02-10 MED ORDER — METOCLOPRAMIDE HCL 5 MG/ML IJ SOLN: 5.0000 mg | Freq: Three times a day (TID) | INTRAMUSCULAR | Status: DC | PRN

## 2020-02-10 MED ORDER — BUPIVACAINE-EPINEPHRINE (PF) 0.5% -1:200000 IJ SOLN
INTRAMUSCULAR | Status: DC | PRN
Start: 2020-02-10 — End: 2020-02-10
  Administered 2020-02-10: 15 mL via PERINEURAL

## 2020-02-10 MED ORDER — ONDANSETRON HCL 4 MG/2ML IJ SOLN: 4.0000 mg | Freq: Once | INTRAMUSCULAR | Status: DC | PRN

## 2020-02-10 MED ORDER — CEFAZOLIN SODIUM-DEXTROSE 2-4 GM/100ML-% IV SOLN
2.0000 g | INTRAVENOUS | Status: AC
  Administered 2020-02-10: 2 g via INTRAVENOUS
  Filled 2020-02-10: qty 100

## 2020-02-10 MED ORDER — DEXMEDETOMIDINE HCL IN NACL 200 MCG/50ML IV SOLN
INTRAVENOUS | Status: DC | PRN
Start: 2020-02-10 — End: 2020-02-10
  Administered 2020-02-10: 16 ug via INTRAVENOUS
  Administered 2020-02-10: 4 ug via INTRAVENOUS

## 2020-02-10 MED ORDER — METOCLOPRAMIDE HCL 5 MG PO TABS: 5.0000 mg | ORAL_TABLET | Freq: Three times a day (TID) | ORAL | Status: DC | PRN

## 2020-02-10 MED ORDER — PROPOFOL 500 MG/50ML IV EMUL
INTRAVENOUS | Status: DC | PRN
  Administered 2020-02-10: 135 ug/kg/min via INTRAVENOUS

## 2020-02-10 MED ORDER — MAGNESIUM CITRATE PO SOLN: 1.0000 | Freq: Once | ORAL | Status: DC | PRN

## 2020-02-10 MED ORDER — METHOCARBAMOL 500 MG PO TABS: 500.0000 mg | ORAL_TABLET | Freq: Four times a day (QID) | ORAL | Status: DC | PRN

## 2020-02-10 MED ORDER — ONDANSETRON HCL 4 MG PO TABS: 4.0000 mg | ORAL_TABLET | Freq: Four times a day (QID) | ORAL | Status: DC | PRN

## 2020-02-10 MED ORDER — PHENOL 1.4 % MT LIQD: 1.0000 | OROMUCOSAL | Status: DC | PRN

## 2020-02-10 MED ORDER — DEXAMETHASONE SODIUM PHOSPHATE 10 MG/ML IJ SOLN
INTRAMUSCULAR | Status: DC | PRN
  Administered 2020-02-10: 5 mg via INTRAVENOUS

## 2020-02-10 MED ORDER — PHENYLEPHRINE HCL-NACL 10-0.9 MG/250ML-% IV SOLN
INTRAVENOUS | Status: DC | PRN
Start: 2020-02-10 — End: 2020-02-10
  Administered 2020-02-10: 25 ug/min via INTRAVENOUS

## 2020-02-10 MED ORDER — DIPHENHYDRAMINE HCL 12.5 MG/5ML PO ELIX: 12.5000 mg | ORAL_SOLUTION | ORAL | Status: DC | PRN

## 2020-02-10 MED ORDER — BISACODYL 5 MG PO TBEC: 5.0000 mg | DELAYED_RELEASE_TABLET | Freq: Every day | ORAL | Status: DC | PRN

## 2020-02-10 MED ORDER — ASPIRIN EC 325 MG PO TBEC
325.0000 mg | DELAYED_RELEASE_TABLET | Freq: Every day | ORAL | Status: DC
  Administered 2020-02-11: 325 mg via ORAL
  Filled 2020-02-10: qty 1

## 2020-02-10 MED ORDER — ELUXADOLINE 75 MG PO TABS: 75.0000 mg | ORAL_TABLET | Freq: Every day | ORAL | Status: DC

## 2020-02-10 MED ORDER — LISINOPRIL 10 MG PO TABS
10.0000 mg | ORAL_TABLET | Freq: Every day | ORAL | Status: DC
  Administered 2020-02-11: 10 mg via ORAL
  Filled 2020-02-10: qty 1

## 2020-02-10 MED ORDER — PHENYLEPHRINE HCL-NACL 10-0.9 MG/250ML-% IV SOLN
INTRAVENOUS | Status: AC
  Filled 2020-02-10: qty 750

## 2020-02-10 MED ORDER — LISINOPRIL-HYDROCHLOROTHIAZIDE 10-12.5 MG PO TABS: 1.0000 | ORAL_TABLET | Freq: Every day | ORAL | Status: DC

## 2020-02-10 MED ORDER — ACETAMINOPHEN 325 MG PO TABS: 325.0000 mg | ORAL_TABLET | Freq: Four times a day (QID) | ORAL | Status: DC | PRN

## 2020-02-10 MED ORDER — 0.9 % SODIUM CHLORIDE (POUR BTL) OPTIME
TOPICAL | Status: DC | PRN
  Administered 2020-02-10: 1000 mL

## 2020-02-10 MED ORDER — SUGAMMADEX SODIUM 200 MG/2ML IV SOLN
INTRAVENOUS | Status: DC | PRN
  Administered 2020-02-10: 150 mg via INTRAVENOUS

## 2020-02-10 MED ORDER — MIDAZOLAM HCL 5 MG/5ML IJ SOLN
INTRAMUSCULAR | Status: DC | PRN
  Administered 2020-02-10 (×2): 1 mg via INTRAVENOUS

## 2020-02-10 MED ORDER — ACETAMINOPHEN 10 MG/ML IV SOLN: 1000.0000 mg | Freq: Once | INTRAVENOUS | Status: DC | PRN

## 2020-02-10 MED ORDER — STERILE WATER FOR IRRIGATION IR SOLN
Status: DC | PRN
  Administered 2020-02-10: 2000 mL

## 2020-02-10 MED ORDER — HYDROCHLOROTHIAZIDE 12.5 MG PO CAPS
12.5000 mg | ORAL_CAPSULE | Freq: Every day | ORAL | Status: DC
  Administered 2020-02-11: 12.5 mg via ORAL
  Filled 2020-02-10: qty 1

## 2020-02-10 MED ORDER — MIDAZOLAM HCL 2 MG/2ML IJ SOLN
INTRAMUSCULAR | Status: AC
  Filled 2020-02-10: qty 2

## 2020-02-10 MED ORDER — PROPOFOL 10 MG/ML IV BOLUS
INTRAVENOUS | Status: AC
  Filled 2020-02-10: qty 40

## 2020-02-10 MED ORDER — METHOCARBAMOL 500 MG IVPB - SIMPLE MED
500.0000 mg | Freq: Four times a day (QID) | INTRAVENOUS | Status: DC | PRN
  Filled 2020-02-10: qty 50

## 2020-02-10 MED ORDER — LIDOCAINE 2% (20 MG/ML) 5 ML SYRINGE
INTRAMUSCULAR | Status: AC
  Filled 2020-02-10: qty 15

## 2020-02-10 MED ORDER — PROPOFOL 500 MG/50ML IV EMUL
INTRAVENOUS | Status: AC
  Filled 2020-02-10: qty 50

## 2020-02-10 MED ORDER — DEXAMETHASONE SODIUM PHOSPHATE 10 MG/ML IJ SOLN
INTRAMUSCULAR | Status: AC
  Filled 2020-02-10: qty 3

## 2020-02-10 SURGICAL SUPPLY — 68 items
BAG ZIPLOCK 12X15 (MISCELLANEOUS) ×2 IMPLANT
BASEPLATE AUG FULL 24X20 +2 (Plate) ×2 IMPLANT
BIT DRILL 2.0X128 (BIT) ×2 IMPLANT
BLADE SAW SGTL 83.5X18.5 (BLADE) ×2 IMPLANT
CALIBRATOR GLENOID VIP 5-D (SYSTAGENIX WOUND MANAGEMENT) ×2 IMPLANT
COOLER ICEMAN CLASSIC (MISCELLANEOUS) ×2 IMPLANT
COVER BACK TABLE 60X90IN (DRAPES) ×2 IMPLANT
COVER SURGICAL LIGHT HANDLE (MISCELLANEOUS) ×2 IMPLANT
COVER WAND RF STERILE (DRAPES) IMPLANT
CUP SUT UNIV REVERS 36 NEUTRAL (Cup) ×2 IMPLANT
DERMABOND ADVANCED (GAUZE/BANDAGES/DRESSINGS) ×1
DERMABOND ADVANCED .7 DNX12 (GAUZE/BANDAGES/DRESSINGS) ×1 IMPLANT
DRAPE ORTHO SPLIT 77X108 STRL (DRAPES) ×2
DRAPE SHEET LG 3/4 BI-LAMINATE (DRAPES) ×2 IMPLANT
DRAPE SURG 17X11 SM STRL (DRAPES) ×2 IMPLANT
DRAPE SURG ORHT 6 SPLT 77X108 (DRAPES) ×2 IMPLANT
DRAPE U-SHAPE 47X51 STRL (DRAPES) ×2 IMPLANT
DRSG AQUACEL AG ADV 3.5X 6 (GAUZE/BANDAGES/DRESSINGS) ×2 IMPLANT
DRSG AQUACEL AG ADV 3.5X10 (GAUZE/BANDAGES/DRESSINGS) ×2 IMPLANT
DURAPREP 26ML APPLICATOR (WOUND CARE) ×4 IMPLANT
ELECT BLADE TIP CTD 4 INCH (ELECTRODE) ×2 IMPLANT
ELECT REM PT RETURN 15FT ADLT (MISCELLANEOUS) ×2 IMPLANT
FACESHIELD WRAPAROUND (MASK) ×8 IMPLANT
GLENOSPHERE 36 +4 LAT/24 (Joint) ×2 IMPLANT
GLOVE BIO SURGEON STRL SZ7.5 (GLOVE) ×2 IMPLANT
GLOVE BIO SURGEON STRL SZ8 (GLOVE) ×2 IMPLANT
GLOVE SS BIOGEL STRL SZ 7 (GLOVE) ×1 IMPLANT
GLOVE SS BIOGEL STRL SZ 7.5 (GLOVE) ×1 IMPLANT
GLOVE SUPERSENSE BIOGEL SZ 7 (GLOVE) ×1
GLOVE SUPERSENSE BIOGEL SZ 7.5 (GLOVE) ×1
GOWN STRL REUS W/TWL LRG LVL3 (GOWN DISPOSABLE) ×4 IMPLANT
INSERT HUMERAL 36 +6 (Shoulder) ×2 IMPLANT
KIT BASIN OR (CUSTOM PROCEDURE TRAY) ×2 IMPLANT
KIT TURNOVER KIT A (KITS) IMPLANT
MANIFOLD NEPTUNE II (INSTRUMENTS) ×2 IMPLANT
MARKER SKIN DUAL TIP RULER LAB (MISCELLANEOUS) ×2 IMPLANT
MOD POST FOR MGS BSPLATE 30 (Miscellaneous) ×2 IMPLANT
NEEDLE TAPERED W/ NITINOL LOOP (MISCELLANEOUS) IMPLANT
NS IRRIG 1000ML POUR BTL (IV SOLUTION) ×2 IMPLANT
PACK SHOULDER (CUSTOM PROCEDURE TRAY) ×2 IMPLANT
PAD COLD SHLDR WRAP-ON (PAD) IMPLANT
PIN NITINOL TARGETER 2.8 (PIN) ×2 IMPLANT
PIN SET MODULAR GLENOID SYSTEM (PIN) ×2 IMPLANT
POST MODLR FOR MGS BSPLATE 30 (Miscellaneous) ×1 IMPLANT
PROTECTOR NERVE ULNAR (MISCELLANEOUS) ×2 IMPLANT
REAMER ANGLED HEAD SMALL (DRILL) ×2 IMPLANT
RESTRAINT HEAD UNIVERSAL NS (MISCELLANEOUS) ×2 IMPLANT
SCREW PERI LOCK 5.5X24 (Screw) ×2 IMPLANT
SCREW PERIPHERAL 5.5X28 LOCK (Screw) ×4 IMPLANT
SCREW PERIPHERAL NL 4.5X28 (Screw) ×2 IMPLANT
SLING ARM FOAM STRAP LRG (SOFTGOODS) IMPLANT
SLING ARM FOAM STRAP MED (SOFTGOODS) ×2 IMPLANT
SMARTMIX MINI TOWER (MISCELLANEOUS)
SPONGE LAP 18X18 RF (DISPOSABLE) IMPLANT
STEM HUMERAL UNIVER REV SIZE 7 (Stem) ×2 IMPLANT
SUCTION FRAZIER HANDLE 12FR (TUBING) ×1
SUCTION TUBE FRAZIER 12FR DISP (TUBING) ×1 IMPLANT
SUT FIBERWIRE #2 38 T-5 BLUE (SUTURE)
SUT MNCRL AB 3-0 PS2 18 (SUTURE) ×2 IMPLANT
SUT MON AB 2-0 CT1 36 (SUTURE) ×2 IMPLANT
SUT VIC AB 1 CT1 36 (SUTURE) ×6 IMPLANT
SUTURE FIBERWR #2 38 T-5 BLUE (SUTURE) IMPLANT
SUTURE TAPE 1.3 40 TPR END (SUTURE) IMPLANT
SUTURETAPE 1.3 40 TPR END (SUTURE)
TOWEL OR 17X26 10 PK STRL BLUE (TOWEL DISPOSABLE) ×2 IMPLANT
TOWEL OR NON WOVEN STRL DISP B (DISPOSABLE) ×2 IMPLANT
TOWER SMARTMIX MINI (MISCELLANEOUS) IMPLANT
WATER STERILE IRR 1000ML POUR (IV SOLUTION) ×4 IMPLANT

## 2020-02-10 NOTE — Anesthesia Procedure Notes (Signed)
Procedure Name: Intubation Date/Time: 02/10/2020 7:40 AM Performed by: Lavina Hamman, CRNA Pre-anesthesia Checklist: Patient identified, Emergency Drugs available, Suction available, Patient being monitored and Timeout performed Patient Re-evaluated:Patient Re-evaluated prior to induction Oxygen Delivery Method: Circle system utilized Preoxygenation: Pre-oxygenation with 100% oxygen Induction Type: IV induction Ventilation: Mask ventilation without difficulty Laryngoscope Size: Mac and 3 Grade View: Grade II Tube type: Oral Tube size: 7.0 mm Number of attempts: 1 Airway Equipment and Method: Stylet Placement Confirmation: ETT inserted through vocal cords under direct vision,  positive ETCO2,  CO2 detector and breath sounds checked- equal and bilateral Secured at: 21 cm Tube secured with: Tape Dental Injury: Teeth and Oropharynx as per pre-operative assessment  Comments: ATOI, easy.

## 2020-02-10 NOTE — H&P (Signed)
Emma Swanson    Chief Complaint: Left shoulder osteoarthritis HPI: The patient is a 79 y.o. 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  Due to her increasing functional imitations and failure to respond to conservative management she is brought to the operating room this time for planned left shoulder arthroplasty.  Past Medical History:  Diagnosis Date  . Anxiety   . Arthritis   . Cancer ([place])    hx of skin cancers   . Family history of adverse reaction to anesthesia    see malignant hyperthermia tab   . GERD (gastroesophageal reflux disease)   . Hypertension   . Hypothyroidism   . Malignant hyperthermia    cousin's son- - malignant hyperthermia al most died  ,  patient's brother and his 2 sons tested positive for malignant hyperthermia  . Palpitations   . PONV (postoperative nausea and vomiting)     Past Surgical History:  Procedure Laterality Date  . AUGMENTATION MAMMAPLASTY Bilateral   . bilateral arthroscopic knee surgery     . bone spur removal     . BREAST BIOPSY    . left ankle surgery - had pins     . thyroid surgery - age 47     . TOTAL KNEE ARTHROPLASTY Right 11/25/2017   Procedure: RIGHT TOTAL KNEE ARTHROPLASTY;  Surgeon: Paralee Cancel, MD;  Location: [facility];  Service: Orthopedics;  Laterality: Right;  70 mins  . TOTAL KNEE ARTHROPLASTY Left 04/16/2018   Procedure: LEFT TOTAL KNEE ARTHROPLASTY;  Surgeon: Paralee Cancel, MD;  Location: [facility];  Service: Orthopedics;  Laterality: Left;  90 mins    History reviewed. No pertinent family history.  Social History:  reports that she has quit smoking. She has never used smokeless tobacco. She reports current alcohol use. She reports that she does not use drugs.   Medications Prior to Admission  Medication Sig Dispense Refill  . Cholecalciferol (VITAMIN D3) 10000 units capsule Take 10,000 Units by mouth daily.    . Cyanocobalamin (B-12) 5000 MCG CAPS Take 5,000  mcg by mouth daily.    Marland Kitchen ipratropium (ATROVENT) 0.06 % nasal spray Place 2-3 sprays into both nostrils daily.     Marland Kitchen lisinopril-hydrochlorothiazide (PRINZIDE,ZESTORETIC) 10-12.5 MG tablet Take 1 tablet by mouth daily.    . Multiple Minerals (CALCIUM-MAGNESIUM-ZINC) TABS Take 1 tablet by mouth daily.    . Multiple Vitamin (MULTIVITAMIN WITH MINERALS) TABS tablet Take 1 tablet by mouth daily.    . Tetrahydrozoline HCl (VISINE OP) Place 1 drop into both eyes daily.     Marland Kitchen triamcinolone cream (KENALOG) 0.1 % Apply 1 application topically daily as needed (itching).    . VIBERZI 75 MG TABS Take 75 mg by mouth daily.     . vitamin E 400 UNIT capsule Take 400 Units by mouth daily.    Marland Kitchen zolpidem (AMBIEN) 10 MG tablet Take 10 mg by mouth at bedtime.     . Calcium Carbonate-Vitamin D (CALCIUM 600+D PO) Take 1 tablet by mouth daily. (Patient not taking: Reported on 01/28/2020)    . celecoxib (CELEBREX) 200 MG capsule Take 1 capsule (200 mg total) by mouth 2 (two) times daily. (Patient not taking: Reported on 01/28/2020) 60 capsule 0  . clobetasol ointment (TEMOVATE) 3.47 % Apply 1 application topically daily as needed (irritation).  (Patient not taking: Reported on 01/28/2020)  0  . docusate sodium (COLACE) 100 MG capsule Take 1 capsule (100 mg total) by mouth 2 (two) times  daily. (Patient not taking: Reported on 01/28/2020) 10 capsule 0  . ferrous sulfate (FERROUSUL) 325 (65 FE) MG tablet Take 1 tablet (325 mg total) by mouth 3 (three) times daily with meals. (Patient not taking: Reported on 01/28/2020)  3  . Hyaluronic Acid-Vitamin C (HYALURONIC ACID PO) Take 300 mg by mouth daily. (Patient not taking: Reported on 01/28/2020)    . HYDROcodone-acetaminophen (NORCO) 7.5-325 MG tablet Take 1-2 tablets by mouth every 4 (four) hours as needed for moderate pain. (Patient not taking: Reported on 01/28/2020) 60 tablet 0  . LUTEIN-ZEAXANTHIN PO Take 1 tablet by mouth daily. (Patient not taking: Reported on 01/28/2020)    .  methocarbamol (ROBAXIN) 500 MG tablet Take 1 tablet (500 mg total) by mouth every 6 (six) hours as needed for muscle spasms. (Patient not taking: Reported on 01/28/2020) 40 tablet 0  . polyethylene glycol (MIRALAX / GLYCOLAX) packet Take 17 g by mouth 2 (two) times daily. (Patient not taking: Reported on 01/28/2020) 14 each 0     Physical Exam: Patient is a slender, cooperative, well-developed female who is alert and oriented.  Left shoulder demonstrates functional motion but severe pain.  Overall good strength.  Plain radiographs confirm advanced osteoarthritis with complete loss of joint space.  Additionally there is a B2 glenoid with significant humeral posterior subluxation.  Preoperative CT scan was obtained for surgical planning.  Vitals     Assessment/Plan  Impression: Left shoulder osteoarthritis  Plan of Action: Procedure(s): Reverse TOTAL SHOULDER ARTHROPLASTY  Nazir Hacker M Danton Palmateer 02/10/2020, 6:06 AM Contact # [phone]

## 2020-02-10 NOTE — Anesthesia Postprocedure Evaluation (Signed)
Anesthesia Post Note  Patient: Emma Swanson  Procedure(s) Performed: TOTAL SHOULDER ARTHROPLASTY (Left Shoulder)     Patient location during evaluation: PACU Anesthesia Type: General and Combined Spinal/Epidural Level of consciousness: awake and alert Pain management: pain level controlled Vital Signs Assessment: post-procedure vital signs reviewed and stable Respiratory status: spontaneous breathing, nonlabored ventilation, respiratory function stable and patient connected to nasal cannula oxygen Cardiovascular status: blood pressure returned to baseline and stable Postop Assessment: no apparent nausea or vomiting Anesthetic complications: no   No complications documented.  Last Vitals:  Vitals:   02/10/20 1317 02/10/20 1419  BP: 125/83 127/81  Pulse: 59 55  Resp: 16 17  Temp: 36.6 C (!) 36.4 C  SpO2: 99% 100%    Last Pain:  Vitals:   02/10/20 1419  TempSrc: Oral  PainSc:                  Barnet Glasgow

## 2020-02-10 NOTE — Plan of Care (Signed)

## 2020-02-10 NOTE — Transfer of Care (Signed)
Immediate Anesthesia Transfer of Care Note  Patient: Emma Swanson  Procedure(s) Performed: Procedure(s) with comments: TOTAL SHOULDER ARTHROPLASTY (Left) - 177min  Patient Location: PACU  Anesthesia Type:GA combined with regional for post-op pain  Level of Consciousness:  sedated, patient cooperative and responds to stimulation  Airway & Oxygen Therapy:Patient Spontanous Breathing and Patient connected to face mask oxgen  Post-op Assessment:  Report given to PACU RN and Post -op Vital signs reviewed and stable  Post vital signs:  Reviewed and stable  Last Vitals:  Vitals:   02/10/20 0618  BP: 133/81  Pulse: 70  Resp: 16  Temp: 36.9 C  SpO2: 54%    Complications: No apparent anesthesia complications

## 2020-02-10 NOTE — Discharge Instructions (Signed)
 Kevin M. Supple, M.D., F.A.A.O.S. Orthopaedic Surgery Specializing in Arthroscopic and Reconstructive Surgery of the Shoulder [phone] [address] - , [place] [address] - Fax [phone]   POST-OP TOTAL SHOULDER REPLACEMENT INSTRUCTIONS  1. Follow up in the office for your first post-op appointment 10-14 days from the date of your surgery. If you do not already have a scheduled appointment, our office will contact you to schedule.  2. The bandage over your incision is waterproof. You may begin showering with this dressing on. You may leave this dressing on until first follow up appointment within 2 weeks. We prefer you leave this dressing in place until follow up however after 5-7 days if you are having itching or skin irritation and would like to remove it you may do so. Go slow and tug at the borders gently to break the bond the dressing has with the skin. At this point if there is no drainage it is okay to go without a bandage or you may cover it with a light guaze and tape. You can also expect significant bruising around your shoulder that will drift down your arm and into your chest wall. This is very normal and should resolve over several days.   3. Wear your sling/immobilizer at all times except to perform the exercises below or to occasionally let your arm dangle by your side to stretch your elbow. You also need to sleep in your sling immobilizer until instructed otherwise. It is ok to remove your sling if you are sitting in a controlled environment and allow your arm to rest in a position of comfort by your side or on your lap with pillows to give your neck and skin a break from the sling. You may remove it to allow arm to dangle by side to shower. If you are up walking around and when you go to sleep at night you need to wear it.  4. Range of motion to your elbow, wrist, and hand are encouraged 3-5 times daily. Exercise to your hand and fingers helps to reduce  swelling you may experience.   5. Prescriptions for a pain medication and a muscle relaxant are provided for you. It is recommended that if you are experiencing pain that you pain medication alone is not controlling, add the muscle relaxant along with the pain medication which can give additional pain relief. The first 1-2 days is generally the most severe of your pain and then should gradually decrease. As your pain lessens it is recommended that you decrease your use of the pain medications to an "as needed basis'" only and to always comply with the recommended dosages of the pain medications.  6. Pain medications can produce constipation along with their use. If you experience this, the use of an over the counter stool softener or laxative daily is recommended.   7. For additional questions or concerns, please do not hesitate to call the office. If after hours there is an answering service to forward your concerns to the physician on call.  8.Pain control following an exparel block  To help control your post-operative pain you received a nerve block  performed with Exparel which is a long acting anesthetic (numbing agent) which can provide pain relief and sensations of numbness (and relief of pain) in the operative shoulder and arm for up to 3 days. Sometimes it provides mixed relief, meaning you may still have numbness in certain areas of the arm but can still be able to   move  parts of that arm, hand, and fingers. We recommend that your prescribed pain medications  be used as needed. We do not feel it is necessary to "pre medicate" and "stay ahead" of pain.  Taking narcotic pain medications when you are not having any pain can lead to unnecessary and potentially dangerous side effects.    9. Use the ice machine as much as possible in the first 5-7 days from surgery, then you can wean its use to as needed. The ice typically needs to be replaced every 6 hours, instead of ice you can actually freeze  water bottles to put in the cooler and then fill water around them to avoid having to purchase ice. You can have spare water bottles freezing to allow you to rotate them once they have melted. Try to have a thin shirt or light cloth or towel under the ice wrap to protect your skin.   10.  We recommend that you avoid any dental work or cleaning in the first 3 months following your joint replacement. This is to help minimize the possibility of infection from the bacteria in your mouth that enters your bloodstream during dental work. We also recommend that you take an antibiotic prior to your dental work for the first year after your shoulder replacement to further help reduce that risk. Please simply contact our office for antibiotics to be sent to your pharmacy prior to dental work.  11. Dental Antibiotics:  In most cases prophylactic antibiotics for Dental procdeures after total joint surgery are not necessary.  Exceptions are as follows:  1. History of prior total joint infection  2. Severely immunocompromised (Organ Transplant, cancer chemotherapy, Rheumatoid biologic meds such as Humera)  3. Poorly controlled diabetes (A1C &gt; 8.0, blood glucose over 200)  If you have one of these conditions, contact your surgeon for an antibiotic prescription, prior to your dental procedure.   POST-OP EXERCISES  Pendulum Exercises  Perform pendulum exercises while standing and bending at the waist. Support your uninvolved arm on a table or chair and allow your operated arm to hang freely. Make sure to do these exercises passively - not using you shoulder muscles. These exercises can be performed once your nerve block effects have worn off.  Repeat 20 times. Do 3 sessions per day.     

## 2020-02-10 NOTE — Anesthesia Procedure Notes (Signed)
Anesthesia Regional Block: Interscalene brachial plexus block   Pre-Anesthetic Checklist: ,, timeout performed, Correct Patient, Correct Site, Correct Laterality, Correct Procedure, Correct Position, site marked, Risks and benefits discussed,  Surgical consent,  Pre-op evaluation,  At surgeon's request and post-op pain management  Laterality: Upper and Right  Prep: Maximum Sterile Barrier Precautions used, chloraprep       Needles:  Injection technique: Single-shot  Needle Type: Echogenic Needle     Needle Length: 5cm  Needle Gauge: 21     Additional Needles:   Procedures:,,,, ultrasound used (permanent image in chart),,,,  Narrative:  Start time: 02/10/2020 6:45 AM End time: 02/10/2020 6:53 AM Injection made incrementally with aspirations every 5 mL.  Performed by: Personally  Anesthesiologist: Barnet Glasgow, MD  Additional Notes: Block assessed prior to procedure. Patient tolerated procedure well.

## 2020-02-10 NOTE — Op Note (Signed)
02/10/2020  9:30 AM  PATIENT:   Emma Swanson  79 y.o. female  PRE-OPERATIVE DIAGNOSIS:  Left shoulder osteoarthritis  POST-OPERATIVE DIAGNOSIS: Same with severe posterior glenoid wear  PROCEDURE: Left shoulder reverse arthroplasty utilizing a 20 degree augmented glenoid baseplate/+2, size 7 humeral stem with a +6 polyethylene insert, and a 36/+4 glenosphere  SURGEON:  Darnel Mchan, Metta Clines M.D.  ASSISTANTS: Jenetta Loges, PA-C  ANESTHESIA:   General endotracheal and interscalene block with Exparel  EBL: 100 cc  SPECIMEN: None  Drains: None   PATIENT DISPOSITION:  PACU - hemodynamically stable.    PLAN OF CARE: Admit for overnight observation  Brief history:  Emma Swanson is a 79 year old female who has been followed for chronic and progressive increasing left shoulder pain related to severe osteoarthritis with associated posterior glenoid erosion and subluxation of the humeral head.  Due to her increasing pain and functional mentation she is brought to the operating this time for planned left shoulder reverse arthroplasty.  Preoperatively, I counseled the patient regarding treatment options and risks versus benefits thereof.  Possible surgical complications were all reviewed including potential for bleeding, infection, neurovascular injury, persistent pain, loss of motion, anesthetic complication, failure of the implant, and possible need for additional surgery. They understand and accept and agrees with our planned procedure.   Procedure in detail:  After undergoing routine preop evaluation patient received prophylactic antibiotics and interscalene block with Exparel was established in the holding area by the anesthesia department.  Patient subsequently placed supine on the operating table and underwent the smooth induction of a general endotracheal anesthesia.  Placed in the beachchair position and appropriately padded and protected.  The left shoulder girdle region was  sterilely prepped and draped in standard fashion.  Timeout was called.  An anterior 7 cm incision was made along the deltopectoral interval beginning at the coracoid process extending laterally distally.  Skin flaps are elevated electrocautery was used for hemostasis.  The deltopectoral interval was then developed from proximal to distal with the vein taken laterally in the upper centimeter of the pectoralis major was tenotomized for exposure.  Conjoined tendon mobilized and retracted medially and suffered gaining retractors were placed.  The long head biceps tendon was then tenodesed at the upper border of the pectoralis major tendon and the proximal segment was then unroofed and excised.  The subscapularis was then separated from the lesser tuberosity using electrocautery and the rotator cuff was split along the rotator interval to the base of the coracoid and the subscapularis was then tagged and reflected medially.  Capsular attachments were then divided in a subperiosteal fashion along the humeral neck and there was a very large osteophyte emanating off the inferior medial humeral neck.  This was removed with rondure.  Extra medullary guide was then used to outline our proposed humeral head resection which was then performed with an oscillating saw carefully protecting the rotator cuff.  Residual osteophytes were then removed with Ron your.  A metal cap was then placed over the cut proximal humeral surface and at this point we exposed the glenoid with appropriate retractors.  Circumferential exposure was achieved and I performed a circumferential labral resection gaining complete visualization of the periphery of the glenoid.  There was significant retroversion noted based on our preoperative planning we ultimately decided to use of a augmented baseplate 20 degrees would allow [name] to correct the deformity and allow for minimal bone removal.  We then utilized the appropriate guide to place a guidepin into the  center of the glenoid and the glenoid was then reamed with a 20 degree offset reamer allowing creation of an propria bony base.  The central drill hole was then created.  Our 20 degree offset baseplate was then impacted with a 30 mm central peg allowing excellent fit and fixation.  We then placed a single nonlocking screw for further seating of the glenoid baseplate and the balance of the screws were locking all obtaining excellent purchase and fixation.  We then passed a peripheral reamer around our baseplate and did not identify any areas of bony impingement.  A 36/+4 glenosphere was then impacted onto the baseplate and the central locking screw was then placed with good fixation.  At this point we then returned our attention to the proximal humerus where the canal was opened by hand reaming and broached up to a size 7 at approximate 45 degrees of retroversion matching the native retroversion.  We then used a neutral reaming post to prepare the metaphysis.  A trial reduction was then performed showing good shoulder motion good stability good soft tissue balance.  Our final implant was then assembled on the back table and was then impacted with excellent fit and fixation.  We then performed a trial reduction and ultimately felt that a +6 polygave [name] the best soft tissue balance.  The trial was removed and the +6 polywas then impacted.  I final reduction was then performed showing excellent motion good soft tissue balance good stability.  The wound was copiously irrigated.  Hemostasis was obtained.  We confirmed good mobility of the subscapularis.  It was then repaired back to the eyelets on the collar of her implant from also repaired the apex of the rotator interval allowing a repair of the rotator cuff anterosuperiorly.:  This the shoulder easily to 45 degrees of external rotation without excessive tension on the subscap repair.  Additional irrigation completed.  Hemostasis was obtained.  The deltopectoral  interval was then reapproximated with a series of figure-of-eight number Vicryl sutures.  2-0 Monocryl used in the subcu layer and intracuticular Monocryl the skin pulled by Dermabond Aquasol dressing left in place with sling the patient was awakened, extubated, and taken to her room in stable addition.  Jenetta Loges, PA-C was utilized as an Environmental consultant throughout this case, essential for help with positioning the patient, positioning extremity, tissue manipulation, implantation of the prosthesis, suture management, wound closure, and intraoperative decision-making.  Marin Shutter MD   Contact # [phone]

## 2020-02-11 ENCOUNTER — Encounter (HOSPITAL_COMMUNITY): Payer: Self-pay | Admitting: Orthopedic Surgery

## 2020-02-11 DIAGNOSIS — Z85828 Personal history of other malignant neoplasm of skin: Secondary | ICD-10-CM | POA: Diagnosis not present

## 2020-02-11 DIAGNOSIS — Z96612 Presence of left artificial shoulder joint: Secondary | ICD-10-CM | POA: Diagnosis not present

## 2020-02-11 DIAGNOSIS — M19012 Primary osteoarthritis, left shoulder: Secondary | ICD-10-CM | POA: Diagnosis not present

## 2020-02-11 DIAGNOSIS — E039 Hypothyroidism, unspecified: Secondary | ICD-10-CM | POA: Diagnosis not present

## 2020-02-11 DIAGNOSIS — Z79899 Other long term (current) drug therapy: Secondary | ICD-10-CM | POA: Diagnosis not present

## 2020-02-11 DIAGNOSIS — F419 Anxiety disorder, unspecified: Secondary | ICD-10-CM | POA: Diagnosis not present

## 2020-02-11 DIAGNOSIS — K219 Gastro-esophageal reflux disease without esophagitis: Secondary | ICD-10-CM | POA: Diagnosis not present

## 2020-02-11 DIAGNOSIS — I10 Essential (primary) hypertension: Secondary | ICD-10-CM | POA: Diagnosis not present

## 2020-02-11 MED ORDER — HYDROCODONE-ACETAMINOPHEN 7.5-325 MG PO TABS: 1.0000 | ORAL_TABLET | ORAL | 0 refills | Status: DC | PRN

## 2020-02-11 MED ORDER — ONDANSETRON HCL 4 MG PO TABS: 4.0000 mg | ORAL_TABLET | Freq: Three times a day (TID) | ORAL | 0 refills | Status: DC | PRN

## 2020-02-11 MED ORDER — CYCLOBENZAPRINE HCL 10 MG PO TABS
10.0000 mg | ORAL_TABLET | Freq: Three times a day (TID) | ORAL | 1 refills | Status: DC | PRN
Start: 2020-02-11 — End: 2020-06-25

## 2020-02-11 NOTE — Discharge Summary (Signed)
PATIENT ID:      Emma Swanson  MRN:     [id number] DOB/AGE:    78/16/1942 / 78 y.o.     DISCHARGE SUMMARY  ADMISSION DATE:    02/10/2020 DISCHARGE DATE:    ADMISSION DIAGNOSIS: Left shoulder osteoarthritis Past Medical History:  Diagnosis Date  . Anxiety   . Arthritis   . Cancer ([place])    hx of skin cancers   . Family history of adverse reaction to anesthesia    see malignant hyperthermia tab   . GERD (gastroesophageal reflux disease)   . Hypertension   . Hypothyroidism   . Malignant hyperthermia    cousin's son- - malignant hyperthermia al most died  ,  patient's brother and his 2 sons tested positive for malignant hyperthermia  . Palpitations   . PONV (postoperative nausea and vomiting)     DISCHARGE DIAGNOSIS:   Active Problems:   S/P reverse total shoulder arthroplasty, left   PROCEDURE: Procedure(s): TOTAL SHOULDER ARTHROPLASTY on 02/10/2020  CONSULTS:    HISTORY:  See H&P in chart.  HOSPITAL COURSE:  Emma Swanson is a 79 y.o. admitted on 02/10/2020 with a diagnosis of Left shoulder osteoarthritis.  They were brought to the operating room on 02/10/2020 and underwent Procedure(s): TOTAL SHOULDER ARTHROPLASTY.    They were given perioperative antibiotics:  Anti-infectives (From admission, onward)   Start     Dose/Rate Route Frequency Ordered Stop   02/10/20 0600  ceFAZolin (ANCEF) IVPB 2g/100 mL premix        2 g 200 mL/hr over 30 Minutes Intravenous On call to O.R. 02/10/20 0540 02/10/20 0748    .  Patient underwent the above named procedure and tolerated it well. The following day they were hemodynamically stable and pain was controlled on oral analgesics. They were neurovascularly intact to the operative extremity. OT was ordered and worked with patient per protocol. They were medically and orthopaedically stable for discharge on .    DIAGNOSTIC STUDIES:  RECENT RADIOGRAPHIC STUDIES :  CT SHOULDER LEFT WO CONTRAST  Result Date: 01/29/2020 CLINICAL DATA:   Preop for total shoulder arthroplasty. EXAM: CT OF THE UPPER LEFT EXTREMITY WITHOUT CONTRAST TECHNIQUE: Multidetector CT imaging of the upper left extremity was performed according to the standard protocol. COMPARISON:  None. FINDINGS: Advanced glenohumeral joint degenerative changes with full-thickness cartilage loss, marked joint space narrowing, osteophytic spurring, bony eburnation and subchondral cystic change. Mild thinning and slight widening of the posterior glenoid. No fracture or significant cystic change. The [facility] joint is intact. The acromion is relatively flat or type 1 in shape. No significant lateral downsloping or undersurface spurring. Grossly by CT the rotator cuff tendons are intact. No obvious full-thickness retracted tear. The visualized left ribs are intact and the visualized left lung is grossly clear. No worrisome pulmonary lesions. IMPRESSION: 1. Advanced glenohumeral joint degenerative changes as described above. 2. Grossly by CT the rotator cuff tendons are intact. Electronically Signed   By: Marijo Sanes M.D.   On: 01/29/2020 16:37    RECENT VITAL SIGNS:   Patient Vitals for the past 24 hrs:  BP Temp Temp src Pulse Resp SpO2  02/11/20 0521 123/69 98.1 F (36.7 C) Oral 67 16 95 %  02/11/20 0115 110/75 97.9 F (36.6 C) -- 64 16 99 %  02/10/20 2106 (!) 130/77 97.7 F (36.5 C) Oral 61 16 92 %  02/10/20 1821 (!) 142/108 97.6 F (36.4 C) Oral 65 17 99 %  02/10/20 1616 121/77 (!) 97.4 F (  36.3 C) Oral 56 17 100 %  02/10/20 1518 117/72 97.6 F (36.4 C) Oral 60 17 100 %  02/10/20 1419 127/81 (!) 97.5 F (36.4 C) Oral 55 17 100 %  02/10/20 1317 125/83 97.8 F (36.6 C) Oral 59 16 99 %  02/10/20 1300 119/78 97.7 F (36.5 C) -- 60 16 96 %  02/10/20 1245 120/77 -- -- 63 19 96 %  02/10/20 1230 (!) 119/62 97.7 F (36.5 C) -- 58 20 95 %  02/10/20 1215 108/69 -- -- 48 21 96 %  02/10/20 1200 113/69 (!) 97.5 F (36.4 C) -- 50 20 97 %  02/10/20 1145 113/71 (!) 97.3 F (36.3 C) --  51 15 97 %  02/10/20 1130 (!) 104/60 (!) 97.3 F (36.3 C) -- 48 17 98 %  02/10/20 1115 (!) 101/63 (!) 97 F (36.1 C) -- 46 21 97 %  02/10/20 1100 98/73 (!) 97 F (36.1 C) -- 48 16 98 %  02/10/20 1045 (!) 95/62 (!) 96.8 F (36 C) -- 47 13 98 %  02/10/20 1030 94/66 (!) 96.6 F (35.9 C) -- 48 18 98 %  02/10/20 1015 97/60 (!) 96.4 F (35.8 C) -- (!) 48 (!) 22 99 %  02/10/20 1000 96/78 (!) 95.5 F (35.3 C) -- (!) 51 14 100 %  02/10/20 0945 (!) 91/56 -- -- (!) 50 15 100 %  02/10/20 0940 (!) 83/52 -- -- (!) 51 15 100 %  02/10/20 0930 (!) 87/54 (!) 95.4 F (35.2 C) -- 60 16 100 %  .  RECENT EKG RESULTS:    Orders placed or performed during the hospital encounter of 02/02/20  . EKG 12 lead per protocol  . EKG 12 lead per protocol    DISCHARGE INSTRUCTIONS:    DISCHARGE MEDICATIONS:   Allergies as of 02/11/2020      Reactions   Other    general anesthesia - pt's family has history of malignant hyperthermia   Oxycodone    Hallucinations    Penicillins    Childhood reaction Has patient had a PCN reaction causing immediate rash, facial/tongue/throat swelling, SOB or lightheadedness with hypotension: Unknown Has patient had a PCN reaction causing severe rash involving mucus membranes or skin necrosis: Unknown Has patient had a PCN reaction that required hospitalization: Unknown Has patient had a PCN reaction occurring within the last 10 years: No If all of the above answers are "NO", then may proceed with Cephalosporin use.      Medication List    STOP taking these medications   CALCIUM 600+D PO   celecoxib 200 MG capsule Commonly known as: CELEBREX   clobetasol ointment 0.05 % Commonly known as: TEMOVATE   ferrous sulfate 325 (65 FE) MG tablet Commonly known as: FerrouSul   HYALURONIC ACID PO   LUTEIN-ZEAXANTHIN PO   methocarbamol 500 MG tablet Commonly known as: Robaxin   polyethylene glycol 17 g packet Commonly known as: MIRALAX / GLYCOLAX     TAKE these  medications   B-12 5000 MCG Caps Take 5,000 mcg by mouth daily.   Calcium-Magnesium-Zinc Tabs Take 1 tablet by mouth daily.   cyclobenzaprine 10 MG tablet Commonly known as: FLEXERIL Take 1 tablet (10 mg total) by mouth 3 (three) times daily as needed for muscle spasms.   docusate sodium 100 MG capsule Commonly known as: Colace Take 1 capsule (100 mg total) by mouth 2 (two) times daily.   HYDROcodone-acetaminophen 7.5-325 MG tablet Commonly known as: Norco Take 1 tablet  by mouth every 4 (four) hours as needed for moderate pain. What changed: how much to take   ipratropium 0.06 % nasal spray Commonly known as: ATROVENT Place 2-3 sprays into both nostrils daily.   lisinopril-hydrochlorothiazide 10-12.5 MG tablet Commonly known as: ZESTORETIC Take 1 tablet by mouth daily.   multivitamin with minerals Tabs tablet Take 1 tablet by mouth daily.   ondansetron 4 MG tablet Commonly known as: Zofran Take 1 tablet (4 mg total) by mouth every 8 (eight) hours as needed for nausea or vomiting.   triamcinolone cream 0.1 % Commonly known as: KENALOG Apply 1 application topically daily as needed (itching).   Viberzi 75 MG Tabs Generic drug: Eluxadoline Take 75 mg by mouth daily.   VISINE OP Place 1 drop into both eyes daily.   Vitamin D3 250 MCG (10000 UT) capsule Take 10,000 Units by mouth daily.   vitamin E 180 MG (400 UNITS) capsule Take 400 Units by mouth daily.   zolpidem 10 MG tablet Commonly known as: AMBIEN Take 10 mg by mouth at bedtime.       FOLLOW UP VISIT:    Follow-up Information    Justice Britain, MD.   Specialty: Orthopedic Surgery Why: in 10-14 days Contact information: [address] [place] [address] [phone]               DISCHARGE TO: Home   DISCHARGE CONDITION:  Thereasa Parkin Tukker Byrns for Dr. Justice Britain 02/11/2020, 8:21 AM

## 2020-02-11 NOTE — Plan of Care (Signed)
Plan of care reviewed and discussed with the patient. 

## 2020-02-11 NOTE — Evaluation (Signed)
Occupational Therapy Evaluation Patient Details Name: Emma Swanson MRN: [id number] DOB: May 09, 1941 Today's Date: 02/11/2020    History of Present Illness 79 year old s/p left shoulder replacement   Clinical Impression   Emma Swanson is a 79 year old woman s/p left shoulder replacement without functional use of left upper extremity secondary to decreased ROM and strength, shoulder precautions and effects of interscalene block. Therapist provided education and instruction to patient in regards to exercises, precautions, positioning, donning upper extremity clothing and bathing while maintaining shoulder precautions, ice and edema management and donning/doffing sling. Patient verbalized understanding and demonstrated ability to donn shirt, lower body clothing and sling. Patient to follow up with MD for further therapy needs.      Follow Up Recommendations  Follow surgeon's recommendation for DC plan and follow-up therapies    Equipment Recommendations  None recommended by OT    Recommendations for Other Services       Precautions / Restrictions Precautions Precautions: Shoulder Shoulder Interventions: Shoulder sling/immobilizer Precaution Booklet Issued: No Required Braces or Orthoses: Sling Restrictions Weight Bearing Restrictions: Yes LUE Weight Bearing: Non weight bearing      Mobility Bed Mobility Overal bed mobility: Independent                Transfers Overall transfer level: Independent                    Balance Overall balance assessment: No apparent balance deficits (not formally assessed)                                         ADL either performed or assessed with clinical judgement   ADL Overall ADL's : Modified independent                                       General ADL Comments: Increased time. Able to compensate with RUE to donn shirt and lower body clothing.     Vision   Vision  Assessment?: No apparent visual deficits     Perception     Praxis      Pertinent Vitals/Pain Pain Assessment: No/denies pain     Hand Dominance     Extremity/Trunk Assessment Upper Extremity Assessment Upper Extremity Assessment: LUE deficits/detail LUE Deficits / Details: decreased ROM/strength at shoulder, limited by shoulder precautions. Able to abduct to 45 degress, poor shoulder flexion; grossly functional elbow, wrist and hand       Cervical / Trunk Assessment Cervical / Trunk Assessment: Normal   Communication     Cognition Arousal/Alertness: Awake/alert Behavior During Therapy: WFL for tasks assessed/performed Overall Cognitive Status: Within Functional Limits for tasks assessed                                     General Comments       Exercises     Shoulder Instructions Shoulder Instructions Donning/doffing shirt without moving shoulder: Patient able to independently direct caregiver;Independent Method for sponge bathing under operated UE: Patient able to independently direct caregiver;Independent Donning/doffing sling/immobilizer: Patient able to independently direct caregiver;Independent Correct positioning of sling/immobilizer: Independent Pendulum exercises (written home exercise program): Independent ROM for elbow, wrist and digits of operated UE: Independent Sling wearing schedule (on  at all times/off for ADL's): Independent Proper positioning of operated UE when showering: Independent Dressing change: Independent Positioning of UE while sleeping: [place] expects to be discharged to:: Private residence Living Arrangements: Alone Available Help at Discharge: Family;Available PRN/intermittently Type of Home: House Home Access: Stairs to enter           Bathroom Shower/Tub: Tub/shower unit     Bathroom Accessibility: Yes   Home Equipment: Environmental consultant - 2 wheels;Shower seat          Prior  Functioning/Environment                   OT Problem List:        OT Treatment/Interventions:      OT Goals(Current goals can be found in the care plan section)    OT Frequency:     Barriers to D/C:            Co-evaluation              AM-PAC OT "6 Clicks" Daily Activity     Outcome Measure Help from another person eating meals?: None Help from another person taking care of personal grooming?: None Help from another person toileting, which includes using toliet, bedpan, or urinal?: None Help from another person bathing (including washing, rinsing, drying)?: None Help from another person to put on and taking off regular upper body clothing?: None Help from another person to put on and taking off regular lower body clothing?: None 6 Click Score: 24   End of Session Equipment Utilized During Treatment: Gait belt Nurse Communication: Mobility status  Activity Tolerance: Patient tolerated treatment well Patient left: in chair;with call bell/phone within reach  OT Visit Diagnosis: Muscle weakness (generalized) (M62.81)                Time: 0321-2248 OT Time Calculation (min): 38 min Charges:  OT General Charges $OT Visit: 1 Visit OT Evaluation $OT Eval Low Complexity: 1 Low OT Treatments $Self Care/Home Management : 8-22 mins  Valetta Mulroy, OTR/L [place]  Office [phone] Pager: [phone]   Lenward Chancellor 02/11/2020, 12:43 PM

## 2020-02-21 DIAGNOSIS — Z96612 Presence of left artificial shoulder joint: Secondary | ICD-10-CM | POA: Diagnosis not present

## 2020-02-21 DIAGNOSIS — Z471 Aftercare following joint replacement surgery: Secondary | ICD-10-CM | POA: Diagnosis not present

## 2020-03-01 DIAGNOSIS — M25512 Pain in left shoulder: Secondary | ICD-10-CM | POA: Diagnosis not present

## 2020-03-10 DIAGNOSIS — M25512 Pain in left shoulder: Secondary | ICD-10-CM | POA: Diagnosis not present

## 2020-03-14 DIAGNOSIS — M25512 Pain in left shoulder: Secondary | ICD-10-CM | POA: Diagnosis not present

## 2020-03-16 DIAGNOSIS — M25512 Pain in left shoulder: Secondary | ICD-10-CM | POA: Diagnosis not present

## 2020-03-21 DIAGNOSIS — M25512 Pain in left shoulder: Secondary | ICD-10-CM | POA: Diagnosis not present

## 2020-03-22 DIAGNOSIS — Z96612 Presence of left artificial shoulder joint: Secondary | ICD-10-CM | POA: Diagnosis not present

## 2020-03-22 DIAGNOSIS — Z471 Aftercare following joint replacement surgery: Secondary | ICD-10-CM | POA: Diagnosis not present

## 2020-03-23 DIAGNOSIS — M25512 Pain in left shoulder: Secondary | ICD-10-CM | POA: Diagnosis not present

## 2020-03-24 DIAGNOSIS — I10 Essential (primary) hypertension: Secondary | ICD-10-CM | POA: Diagnosis not present

## 2020-03-24 DIAGNOSIS — G4700 Insomnia, unspecified: Secondary | ICD-10-CM | POA: Diagnosis not present

## 2020-03-24 DIAGNOSIS — R5383 Other fatigue: Secondary | ICD-10-CM | POA: Diagnosis not present

## 2020-03-24 DIAGNOSIS — E781 Pure hyperglyceridemia: Secondary | ICD-10-CM | POA: Diagnosis not present

## 2020-03-24 DIAGNOSIS — Z79899 Other long term (current) drug therapy: Secondary | ICD-10-CM | POA: Diagnosis not present

## 2020-03-28 DIAGNOSIS — M25512 Pain in left shoulder: Secondary | ICD-10-CM | POA: Diagnosis not present

## 2020-03-30 DIAGNOSIS — M25512 Pain in left shoulder: Secondary | ICD-10-CM | POA: Diagnosis not present

## 2020-04-03 DIAGNOSIS — M25512 Pain in left shoulder: Secondary | ICD-10-CM | POA: Diagnosis not present

## 2020-04-06 DIAGNOSIS — M25512 Pain in left shoulder: Secondary | ICD-10-CM | POA: Diagnosis not present

## 2020-04-10 DIAGNOSIS — M25512 Pain in left shoulder: Secondary | ICD-10-CM | POA: Diagnosis not present

## 2020-04-11 DIAGNOSIS — M5382 Other specified dorsopathies, cervical region: Secondary | ICD-10-CM | POA: Diagnosis not present

## 2020-04-11 DIAGNOSIS — M4312 Spondylolisthesis, cervical region: Secondary | ICD-10-CM | POA: Diagnosis not present

## 2020-04-11 DIAGNOSIS — M9901 Segmental and somatic dysfunction of cervical region: Secondary | ICD-10-CM | POA: Diagnosis not present

## 2020-04-11 DIAGNOSIS — M47812 Spondylosis without myelopathy or radiculopathy, cervical region: Secondary | ICD-10-CM | POA: Diagnosis not present

## 2020-04-12 DIAGNOSIS — M25512 Pain in left shoulder: Secondary | ICD-10-CM | POA: Diagnosis not present

## 2020-04-18 DIAGNOSIS — M9901 Segmental and somatic dysfunction of cervical region: Secondary | ICD-10-CM | POA: Diagnosis not present

## 2020-04-18 DIAGNOSIS — M47812 Spondylosis without myelopathy or radiculopathy, cervical region: Secondary | ICD-10-CM | POA: Diagnosis not present

## 2020-04-18 DIAGNOSIS — M4312 Spondylolisthesis, cervical region: Secondary | ICD-10-CM | POA: Diagnosis not present

## 2020-04-18 DIAGNOSIS — M5382 Other specified dorsopathies, cervical region: Secondary | ICD-10-CM | POA: Diagnosis not present

## 2020-04-21 ENCOUNTER — Encounter: Payer: Self-pay | Admitting: Orthopedic Surgery

## 2020-04-26 DIAGNOSIS — M9901 Segmental and somatic dysfunction of cervical region: Secondary | ICD-10-CM | POA: Diagnosis not present

## 2020-04-26 DIAGNOSIS — M4312 Spondylolisthesis, cervical region: Secondary | ICD-10-CM | POA: Diagnosis not present

## 2020-04-26 DIAGNOSIS — M47812 Spondylosis without myelopathy or radiculopathy, cervical region: Secondary | ICD-10-CM | POA: Diagnosis not present

## 2020-04-26 DIAGNOSIS — M5382 Other specified dorsopathies, cervical region: Secondary | ICD-10-CM | POA: Diagnosis not present

## 2020-04-27 DIAGNOSIS — M25512 Pain in left shoulder: Secondary | ICD-10-CM | POA: Diagnosis not present

## 2020-04-28 DIAGNOSIS — M13842 Other specified arthritis, left hand: Secondary | ICD-10-CM | POA: Diagnosis not present

## 2020-04-28 DIAGNOSIS — M13841 Other specified arthritis, right hand: Secondary | ICD-10-CM | POA: Diagnosis not present

## 2020-05-02 DIAGNOSIS — M25512 Pain in left shoulder: Secondary | ICD-10-CM | POA: Diagnosis not present

## 2020-05-03 DIAGNOSIS — M4312 Spondylolisthesis, cervical region: Secondary | ICD-10-CM | POA: Diagnosis not present

## 2020-05-03 DIAGNOSIS — M9901 Segmental and somatic dysfunction of cervical region: Secondary | ICD-10-CM | POA: Diagnosis not present

## 2020-05-03 DIAGNOSIS — M47812 Spondylosis without myelopathy or radiculopathy, cervical region: Secondary | ICD-10-CM | POA: Diagnosis not present

## 2020-05-03 DIAGNOSIS — M5382 Other specified dorsopathies, cervical region: Secondary | ICD-10-CM | POA: Diagnosis not present

## 2020-05-03 DIAGNOSIS — Z4789 Encounter for other orthopedic aftercare: Secondary | ICD-10-CM | POA: Diagnosis not present

## 2020-05-04 DIAGNOSIS — M25512 Pain in left shoulder: Secondary | ICD-10-CM | POA: Diagnosis not present

## 2020-05-08 DIAGNOSIS — M25512 Pain in left shoulder: Secondary | ICD-10-CM | POA: Diagnosis not present

## 2020-05-10 DIAGNOSIS — M4312 Spondylolisthesis, cervical region: Secondary | ICD-10-CM | POA: Diagnosis not present

## 2020-05-10 DIAGNOSIS — M47812 Spondylosis without myelopathy or radiculopathy, cervical region: Secondary | ICD-10-CM | POA: Diagnosis not present

## 2020-05-10 DIAGNOSIS — M9901 Segmental and somatic dysfunction of cervical region: Secondary | ICD-10-CM | POA: Diagnosis not present

## 2020-05-10 DIAGNOSIS — M25512 Pain in left shoulder: Secondary | ICD-10-CM | POA: Diagnosis not present

## 2020-05-10 DIAGNOSIS — M5382 Other specified dorsopathies, cervical region: Secondary | ICD-10-CM | POA: Diagnosis not present

## 2020-05-12 DIAGNOSIS — Z96653 Presence of artificial knee joint, bilateral: Secondary | ICD-10-CM | POA: Diagnosis not present

## 2020-05-23 DIAGNOSIS — M25512 Pain in left shoulder: Secondary | ICD-10-CM | POA: Diagnosis not present

## 2020-05-25 DIAGNOSIS — M25512 Pain in left shoulder: Secondary | ICD-10-CM | POA: Diagnosis not present

## 2020-05-30 DIAGNOSIS — M25512 Pain in left shoulder: Secondary | ICD-10-CM | POA: Diagnosis not present

## 2020-06-01 DIAGNOSIS — M25512 Pain in left shoulder: Secondary | ICD-10-CM | POA: Diagnosis not present

## 2020-06-06 DIAGNOSIS — M25512 Pain in left shoulder: Secondary | ICD-10-CM | POA: Diagnosis not present

## 2020-06-13 DIAGNOSIS — M25512 Pain in left shoulder: Secondary | ICD-10-CM | POA: Diagnosis not present

## 2020-06-20 DIAGNOSIS — M25512 Pain in left shoulder: Secondary | ICD-10-CM | POA: Diagnosis not present

## 2020-06-22 DIAGNOSIS — M25512 Pain in left shoulder: Secondary | ICD-10-CM | POA: Diagnosis not present

## 2020-06-23 DIAGNOSIS — Z131 Encounter for screening for diabetes mellitus: Secondary | ICD-10-CM | POA: Diagnosis not present

## 2020-06-23 DIAGNOSIS — Z0000 Encounter for general adult medical examination without abnormal findings: Secondary | ICD-10-CM | POA: Diagnosis not present

## 2020-06-23 DIAGNOSIS — E781 Pure hyperglyceridemia: Secondary | ICD-10-CM | POA: Diagnosis not present

## 2020-06-23 DIAGNOSIS — Z1331 Encounter for screening for depression: Secondary | ICD-10-CM | POA: Diagnosis not present

## 2020-06-23 DIAGNOSIS — Z1339 Encounter for screening examination for other mental health and behavioral disorders: Secondary | ICD-10-CM | POA: Diagnosis not present

## 2020-06-25 ENCOUNTER — Other Ambulatory Visit: Payer: Self-pay

## 2020-06-25 ENCOUNTER — Emergency Department (HOSPITAL_COMMUNITY)
Admission: EM | Admit: 2020-06-25 | Discharge: 2020-06-25 | Disposition: A | Discharge status: Home / Self Care | Payer: BC Managed Care – PPO | Attending: Emergency Medicine | Admitting: Emergency Medicine

## 2020-06-25 ENCOUNTER — Encounter (HOSPITAL_COMMUNITY): Payer: Self-pay | Admitting: Obstetrics and Gynecology

## 2020-06-25 ENCOUNTER — Emergency Department (HOSPITAL_COMMUNITY): Payer: BC Managed Care – PPO

## 2020-06-25 DIAGNOSIS — I10 Essential (primary) hypertension: Secondary | ICD-10-CM | POA: Insufficient documentation

## 2020-06-25 DIAGNOSIS — Z96612 Presence of left artificial shoulder joint: Secondary | ICD-10-CM | POA: Diagnosis not present

## 2020-06-25 DIAGNOSIS — R109 Unspecified abdominal pain: Secondary | ICD-10-CM | POA: Diagnosis not present

## 2020-06-25 DIAGNOSIS — K529 Noninfective gastroenteritis and colitis, unspecified: Secondary | ICD-10-CM | POA: Insufficient documentation

## 2020-06-25 DIAGNOSIS — Z96653 Presence of artificial knee joint, bilateral: Secondary | ICD-10-CM | POA: Insufficient documentation

## 2020-06-25 DIAGNOSIS — Z87891 Personal history of nicotine dependence: Secondary | ICD-10-CM | POA: Insufficient documentation

## 2020-06-25 DIAGNOSIS — Z79899 Other long term (current) drug therapy: Secondary | ICD-10-CM | POA: Insufficient documentation

## 2020-06-25 DIAGNOSIS — K219 Gastro-esophageal reflux disease without esophagitis: Secondary | ICD-10-CM | POA: Insufficient documentation

## 2020-06-25 DIAGNOSIS — Z85828 Personal history of other malignant neoplasm of skin: Secondary | ICD-10-CM | POA: Insufficient documentation

## 2020-06-25 DIAGNOSIS — E039 Hypothyroidism, unspecified: Secondary | ICD-10-CM | POA: Diagnosis not present

## 2020-06-25 LAB — CBC WITH DIFFERENTIAL/PLATELET
Abs Immature Granulocytes: 0.06 10*3/uL (ref 0.00–0.07)
Basophils Absolute: 0 10*3/uL (ref 0.0–0.1)
Basophils Relative: 0 %
Eosinophils Absolute: 0 10*3/uL (ref 0.0–0.5)
Eosinophils Relative: 0 %
HCT: 47.2 % — ABNORMAL HIGH (ref 36.0–46.0)
Hemoglobin: 15.7 g/dL — ABNORMAL HIGH (ref 12.0–15.0)
Immature Granulocytes: 1 %
Lymphocytes Relative: 12 %
Lymphs Abs: 1.4 10*3/uL (ref 0.7–4.0)
MCH: 31 pg (ref 26.0–34.0)
MCHC: 33.3 g/dL (ref 30.0–36.0)
MCV: 93.3 fL (ref 80.0–100.0)
Monocytes Absolute: 1.1 10*3/uL — ABNORMAL HIGH (ref 0.1–1.0)
Monocytes Relative: 9 %
Neutro Abs: 9.2 10*3/uL — ABNORMAL HIGH (ref 1.7–7.7)
Neutrophils Relative %: 78 %
Platelets: 270 10*3/uL (ref 150–400)
RBC: 5.06 MIL/uL (ref 3.87–5.11)
RDW: 14.1 % (ref 11.5–15.5)
WBC: 11.8 10*3/uL — ABNORMAL HIGH (ref 4.0–10.5)
nRBC: 0 % (ref 0.0–0.2)

## 2020-06-25 LAB — URINALYSIS, ROUTINE W REFLEX MICROSCOPIC
Bacteria, UA: NONE SEEN
Bilirubin Urine: NEGATIVE
Glucose, UA: NEGATIVE mg/dL
Ketones, ur: NEGATIVE mg/dL
Nitrite: NEGATIVE
Protein, ur: 30 mg/dL — AB
Specific Gravity, Urine: 1.025 (ref 1.005–1.030)
pH: 6 (ref 5.0–8.0)

## 2020-06-25 LAB — COMPREHENSIVE METABOLIC PANEL
ALT: 24 U/L (ref 0–44)
AST: 24 U/L (ref 15–41)
Albumin: 4.3 g/dL (ref 3.5–5.0)
Alkaline Phosphatase: 52 U/L (ref 38–126)
Anion gap: 11 (ref 5–15)
BUN: 25 mg/dL — ABNORMAL HIGH (ref 8–23)
CO2: 26 mmol/L (ref 22–32)
Calcium: 9.4 mg/dL (ref 8.9–10.3)
Chloride: 98 mmol/L (ref 98–111)
Creatinine, Ser: 0.94 mg/dL (ref 0.44–1.00)
GFR, Estimated: >60 mL/min (ref >60)
Glucose, Bld: 115 mg/dL — ABNORMAL HIGH (ref 70–99)
Potassium: 3.7 mmol/L (ref 3.5–5.1)
Sodium: 135 mmol/L (ref 135–145)
Total Bilirubin: 1.9 mg/dL — ABNORMAL HIGH (ref 0.3–1.2)
Total Protein: 7.3 g/dL (ref 6.5–8.1)

## 2020-06-25 LAB — LIPASE, BLOOD: Lipase: 20 U/L (ref 11–51)

## 2020-06-25 MED ORDER — CIPROFLOXACIN HCL 500 MG PO TABS: 500.0000 mg | ORAL_TABLET | Freq: Two times a day (BID) | ORAL | 0 refills | Status: AC

## 2020-06-25 MED ORDER — METRONIDAZOLE 500 MG PO TABS: 500.0000 mg | ORAL_TABLET | Freq: Two times a day (BID) | ORAL | 0 refills | Status: DC

## 2020-06-25 MED ORDER — ONDANSETRON 4 MG PO TBDP: 4.0000 mg | ORAL_TABLET | Freq: Three times a day (TID) | ORAL | 0 refills | Status: DC | PRN

## 2020-06-25 MED ORDER — HYDROCODONE-ACETAMINOPHEN 5-325 MG PO TABS: 1.0000 | ORAL_TABLET | ORAL | 0 refills | Status: AC | PRN

## 2020-06-25 MED ORDER — IOHEXOL 300 MG/ML  SOLN
100.0000 mL | Freq: Once | INTRAMUSCULAR | Status: AC | PRN
  Administered 2020-06-25: 100 mL via INTRAVENOUS

## 2020-06-25 NOTE — Discharge Instructions (Signed)
See your Gi doctor for recheck. °

## 2020-06-25 NOTE — ED Provider Notes (Signed)
Medical screening examination/treatment/procedure(s) were conducted as a shared visit with non-physician practitioner(s) and myself.  I personally evaluated the patient during the encounter.    79 year old female presents with abdominal discomfort.  Abdominal CT consistent with infectious colitis.  Will treat with antibiotics and discharge   Lacretia Leigh, MD 06/25/20 1347

## 2020-06-25 NOTE — ED Triage Notes (Signed)
Patient reports to the ER for abdominal pain, cramping, and blood in stool since last night. 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 Patient denies hx of diverticulitis

## 2020-06-26 DIAGNOSIS — M25512 Pain in left shoulder: Secondary | ICD-10-CM | POA: Diagnosis not present

## 2020-06-27 DIAGNOSIS — L738 Other specified follicular disorders: Secondary | ICD-10-CM | POA: Diagnosis not present

## 2020-06-27 DIAGNOSIS — L814 Other melanin hyperpigmentation: Secondary | ICD-10-CM | POA: Diagnosis not present

## 2020-06-27 DIAGNOSIS — L570 Actinic keratosis: Secondary | ICD-10-CM | POA: Diagnosis not present

## 2020-06-27 DIAGNOSIS — A09 Infectious gastroenteritis and colitis, unspecified: Secondary | ICD-10-CM | POA: Diagnosis not present

## 2020-06-27 LAB — URINE CULTURE: Culture: 30000 — AB

## 2020-06-30 DIAGNOSIS — M25512 Pain in left shoulder: Secondary | ICD-10-CM | POA: Diagnosis not present

## 2020-07-03 DIAGNOSIS — Z01419 Encounter for gynecological examination (general) (routine) without abnormal findings: Secondary | ICD-10-CM | POA: Diagnosis not present

## 2020-07-03 DIAGNOSIS — Z6823 Body mass index (BMI) 23.0-23.9, adult: Secondary | ICD-10-CM | POA: Diagnosis not present

## 2020-07-03 DIAGNOSIS — Z1231 Encounter for screening mammogram for malignant neoplasm of breast: Secondary | ICD-10-CM | POA: Diagnosis not present

## 2020-07-03 DIAGNOSIS — N952 Postmenopausal atrophic vaginitis: Secondary | ICD-10-CM | POA: Diagnosis not present

## 2020-07-03 NOTE — ED Provider Notes (Signed)
[facility] DEPT Provider Note   CSN: [id number] Arrival date & time: 06/25/20  1059     History Chief Complaint  Patient presents with  . Abdominal Cramping  . Blood In Stools    Emma Swanson is a 79 y.o. female.  The history is provided by the patient. No language interpreter was used.  Abdominal Cramping This is a new problem. The current episode started 12 to 24 hours ago. The problem has been gradually worsening. Associated symptoms include abdominal pain. Nothing aggravates the symptoms. Nothing relieves the symptoms. She has tried nothing for the symptoms. The treatment provided no relief.       Past Medical History:  Diagnosis Date  . Anxiety   . Arthritis   . Cancer ([place])    hx of skin cancers   . Family history of adverse reaction to anesthesia    see malignant hyperthermia tab   . GERD (gastroesophageal reflux disease)   . Hypertension   . Hypothyroidism   . Malignant hyperthermia    cousin's son- - malignant hyperthermia al most died  ,  patient's brother and his 2 sons tested positive for malignant hyperthermia  . Palpitations   . PONV (postoperative nausea and vomiting)     Patient Active Problem List   Diagnosis Date Noted  . S/P reverse total shoulder arthroplasty, 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  . S/P left TKA 04/16/2018  . 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 AUGMENTATION MAMMAPLASTY Bilateral   . bilateral arthroscopic knee surgery     . bone spur removal     . BREAST BIOPSY    . left ankle surgery - had pins     . thyroid surgery - age 60     . TOTAL KNEE ARTHROPLASTY Right 11/25/2017   Procedure: RIGHT TOTAL KNEE ARTHROPLASTY;  Surgeon: Paralee Cancel, MD;  Location: [facility] ORS;  Service: Orthopedics;  Laterality: Right;  70 mins  . TOTAL KNEE ARTHROPLASTY Left 04/16/2018   Procedure: LEFT TOTAL KNEE ARTHROPLASTY;  Surgeon: Paralee Cancel, MD;  Location: [facility] ORS;  Service: Orthopedics;   Laterality: Left;  90 mins  . TOTAL SHOULDER ARTHROPLASTY Left 02/10/2020   Procedure: TOTAL SHOULDER ARTHROPLASTY;  Surgeon: Justice Britain, MD;  Location: [facility] ORS;  Service: Orthopedics;  Laterality: Left;  131min     OB History   No obstetric history on file.     No family history on file.  Social History   Tobacco Use  . Smoking status: Former Research scientist (life sciences)  . Smokeless tobacco: Never Used  . Tobacco comment: age of 71 until age 79   Vaping Use  . Vaping Use: Never used  Substance Use Topics  . Alcohol use: Yes    Comment: 2 drinks per nite   . Drug use: Never    Home Medications Prior to Admission medications   Medication Sig Start Date End Date Taking? Authorizing Provider  Cholecalciferol (VITAMIN D3) 10000 units capsule Take 10,000 Units by mouth daily.   Yes [provider]  Cyanocobalamin (B-12) 5000 MCG CAPS Take 5,000 mcg by mouth daily.   Yes [provider]  ipratropium (ATROVENT) 0.06 % nasal spray Place 2-3 sprays into both nostrils daily.    Yes [provider]  lisinopril-hydrochlorothiazide (PRINZIDE,ZESTORETIC) 10-12.5 MG tablet Take 1 tablet by mouth daily.   Yes [provider]  Multiple Vitamin (MULTIVITAMIN WITH MINERALS) TABS tablet Take 1 tablet by mouth daily.   Yes [provider]  Tetrahydrozoline HCl (VISINE OP) Place 1 drop into both eyes daily.    Yes [provider]  triamcinolone cream (KENALOG) 0.1 % Apply 1 application topically daily as needed (itching).   Yes [provider]  VIBERZI 75 MG TABS Take 75 mg by mouth daily.  11/20/19  Yes [provider]  vitamin E 400 UNIT capsule Take 400 Units by mouth daily.   Yes [provider]  zolpidem (AMBIEN) 10 MG tablet Take 10 mg by mouth at bedtime.    Yes [provider]  ciprofloxacin (CIPRO) 500 MG tablet Take 1 tablet (500 mg total) by mouth 2 (two) times daily for 10 days. 06/25/20 07/05/20  Fransico Meadow, PA-C   HYDROcodone-acetaminophen (NORCO/VICODIN) 5-325 MG tablet Take 1 tablet by mouth every 4 (four) hours as needed for moderate pain. 06/25/20 06/25/21  Fransico Meadow, PA-C  metroNIDAZOLE (FLAGYL) 500 MG tablet Take 1 tablet (500 mg total) by mouth 2 (two) times daily. 06/25/20   Fransico Meadow, PA-C  Multiple Minerals (CALCIUM-MAGNESIUM-ZINC) TABS Take 1 tablet by mouth daily.    [provider]  ondansetron (ZOFRAN ODT) 4 MG disintegrating tablet Take 1 tablet (4 mg total) by mouth every 8 (eight) hours as needed for nausea or vomiting. 06/25/20   Fransico Meadow, PA-C    Allergies    Other, Oxycodone, and Penicillins  Review of Systems   Review of Systems  Gastrointestinal: Positive for abdominal pain.  All other systems reviewed and are negative.   Physical Exam Updated Vital Signs BP 114/74   Pulse (!) 103   Temp 98.3 F (36.8 C) (Oral)   Resp 18   SpO2 97%   Physical Exam Vitals and nursing note reviewed.  Constitutional:      Appearance: She is well-developed and well-nourished.  HENT:     Head: Normocephalic.     Nose: Nose normal.     Mouth/Throat:     Mouth: Mucous membranes are moist.  Eyes:     Extraocular Movements: Extraocular movements intact and EOM normal.     Pupils: Pupils are equal, round, and reactive to light.  Cardiovascular:     Rate and Rhythm: Normal rate and regular rhythm.     Pulses: Normal pulses.  Pulmonary:     Effort: Pulmonary effort is normal.  Abdominal:     General: Abdomen is flat. There is no distension.  Musculoskeletal:        General: Normal range of motion.     Cervical back: Normal range of motion.  Skin:    General: Skin is warm.  Neurological:     Mental Status: She is alert and oriented to person, place, and time.  Psychiatric:        Mood and Affect: Mood and affect and mood normal.     ED Results / Procedures / Treatments   Labs (all labs ordered are listed, but only abnormal results are displayed) Labs  Reviewed  URINE CULTURE - Abnormal; Notable for the following components:      Result Value   Culture   (*)    Value: 30,000 COLONIES/mL MULTIPLE SPECIES PRESENT, SUGGEST RECOLLECTION   All other components within normal limits  CBC WITH DIFFERENTIAL/PLATELET - Abnormal; Notable for the following components:   WBC 11.8 (*)    Hemoglobin 15.7 (*)    HCT 47.2 (*)    Neutro Abs 9.2 (*)    Monocytes Absolute 1.1 (*)    All  other components within normal limits  COMPREHENSIVE METABOLIC PANEL - Abnormal; Notable for the following components:   Glucose, Bld 115 (*)    BUN 25 (*)    Total Bilirubin 1.9 (*)    All other components within normal limits  URINALYSIS, ROUTINE W REFLEX MICROSCOPIC - Abnormal; Notable for the following components:   Color, Urine AMBER (*)    APPearance HAZY (*)    Hgb urine dipstick SMALL (*)    Protein, ur 30 (*)    Leukocytes,Ua MODERATE (*)    All other components within normal limits  LIPASE, BLOOD    EKG None  Radiology No results found.  Procedures Procedures (including critical care time)  Medications Ordered in ED Medications  iohexol (OMNIPAQUE) 300 MG/ML solution 100 mL (100 mLs Intravenous Contrast Given 06/25/20 1315)    ED Course  I have reviewed the triage vital signs and the nursing notes.  Pertinent labs & imaging results that were available during my care of the patient were reviewed by me and considered in my medical decision making (see chart for details).    MDM Rules/Calculators/A&P                          MDM:  Ct scan shows colitis,  Pt counseled on colitis and uti.  Pt advised to follow up with her Gi doctor  for recheck.  Pt given rx for cipro, flagyl, zofran and vicodin.  Pt counseled on possible uti  Final Clinical Impression(s) / ED Diagnoses Final diagnoses:  Colitis, acute    Rx / DC Orders ED Discharge Orders         Ordered    ciprofloxacin (CIPRO) 500 MG tablet  2 times daily        06/25/20 1357     metroNIDAZOLE (FLAGYL) 500 MG tablet  2 times daily        06/25/20 1357    ondansetron (ZOFRAN ODT) 4 MG disintegrating tablet  Every 8 hours PRN        06/25/20 1357    HYDROcodone-acetaminophen (NORCO/VICODIN) 5-325 MG tablet  Every 4 hours PRN        06/25/20 1357        An After Visit Summary was printed and given to the patient.    Fransico Meadow, [place] 07/03/20 1405    Lacretia Leigh, MD 07/04/20 1020

## 2020-07-04 DIAGNOSIS — M25512 Pain in left shoulder: Secondary | ICD-10-CM | POA: Diagnosis not present

## 2020-07-07 DIAGNOSIS — M25512 Pain in left shoulder: Secondary | ICD-10-CM | POA: Diagnosis not present

## 2020-07-10 DIAGNOSIS — M25512 Pain in left shoulder: Secondary | ICD-10-CM | POA: Diagnosis not present

## 2020-07-28 DIAGNOSIS — Z96612 Presence of left artificial shoulder joint: Secondary | ICD-10-CM | POA: Diagnosis not present

## 2020-07-31 DIAGNOSIS — M4312 Spondylolisthesis, cervical region: Secondary | ICD-10-CM | POA: Diagnosis not present

## 2020-07-31 DIAGNOSIS — M9901 Segmental and somatic dysfunction of cervical region: Secondary | ICD-10-CM | POA: Diagnosis not present

## 2020-07-31 DIAGNOSIS — M5382 Other specified dorsopathies, cervical region: Secondary | ICD-10-CM | POA: Diagnosis not present

## 2020-08-07 DIAGNOSIS — M9901 Segmental and somatic dysfunction of cervical region: Secondary | ICD-10-CM | POA: Diagnosis not present

## 2020-08-07 DIAGNOSIS — M4312 Spondylolisthesis, cervical region: Secondary | ICD-10-CM | POA: Diagnosis not present

## 2020-08-07 DIAGNOSIS — M5382 Other specified dorsopathies, cervical region: Secondary | ICD-10-CM | POA: Diagnosis not present

## 2020-08-16 DIAGNOSIS — M5382 Other specified dorsopathies, cervical region: Secondary | ICD-10-CM | POA: Diagnosis not present

## 2020-08-16 DIAGNOSIS — M9901 Segmental and somatic dysfunction of cervical region: Secondary | ICD-10-CM | POA: Diagnosis not present

## 2020-08-16 DIAGNOSIS — M4312 Spondylolisthesis, cervical region: Secondary | ICD-10-CM | POA: Diagnosis not present

## 2020-08-23 DIAGNOSIS — M5382 Other specified dorsopathies, cervical region: Secondary | ICD-10-CM | POA: Diagnosis not present

## 2020-08-23 DIAGNOSIS — M9901 Segmental and somatic dysfunction of cervical region: Secondary | ICD-10-CM | POA: Diagnosis not present

## 2020-08-23 DIAGNOSIS — M4312 Spondylolisthesis, cervical region: Secondary | ICD-10-CM | POA: Diagnosis not present

## 2020-08-30 DIAGNOSIS — M9901 Segmental and somatic dysfunction of cervical region: Secondary | ICD-10-CM | POA: Diagnosis not present

## 2020-08-30 DIAGNOSIS — M5382 Other specified dorsopathies, cervical region: Secondary | ICD-10-CM | POA: Diagnosis not present

## 2020-08-30 DIAGNOSIS — M4312 Spondylolisthesis, cervical region: Secondary | ICD-10-CM | POA: Diagnosis not present

## 2020-09-13 DIAGNOSIS — M5382 Other specified dorsopathies, cervical region: Secondary | ICD-10-CM | POA: Diagnosis not present

## 2020-09-13 DIAGNOSIS — M9901 Segmental and somatic dysfunction of cervical region: Secondary | ICD-10-CM | POA: Diagnosis not present

## 2020-09-13 DIAGNOSIS — M4312 Spondylolisthesis, cervical region: Secondary | ICD-10-CM | POA: Diagnosis not present

## 2020-09-20 DIAGNOSIS — Z79899 Other long term (current) drug therapy: Secondary | ICD-10-CM | POA: Diagnosis not present

## 2020-09-20 DIAGNOSIS — M5382 Other specified dorsopathies, cervical region: Secondary | ICD-10-CM | POA: Diagnosis not present

## 2020-09-20 DIAGNOSIS — E781 Pure hyperglyceridemia: Secondary | ICD-10-CM | POA: Diagnosis not present

## 2020-09-20 DIAGNOSIS — G4700 Insomnia, unspecified: Secondary | ICD-10-CM | POA: Diagnosis not present

## 2020-09-20 DIAGNOSIS — M9901 Segmental and somatic dysfunction of cervical region: Secondary | ICD-10-CM | POA: Diagnosis not present

## 2020-09-20 DIAGNOSIS — M4312 Spondylolisthesis, cervical region: Secondary | ICD-10-CM | POA: Diagnosis not present

## 2020-09-20 DIAGNOSIS — I10 Essential (primary) hypertension: Secondary | ICD-10-CM | POA: Diagnosis not present

## 2020-09-26 DIAGNOSIS — M5382 Other specified dorsopathies, cervical region: Secondary | ICD-10-CM | POA: Diagnosis not present

## 2020-09-26 DIAGNOSIS — M9901 Segmental and somatic dysfunction of cervical region: Secondary | ICD-10-CM | POA: Diagnosis not present

## 2020-09-26 DIAGNOSIS — M4312 Spondylolisthesis, cervical region: Secondary | ICD-10-CM | POA: Diagnosis not present

## 2020-10-11 DIAGNOSIS — M79672 Pain in left foot: Secondary | ICD-10-CM | POA: Insufficient documentation

## 2020-10-11 DIAGNOSIS — S82242P Displaced spiral fracture of shaft of left tibia, subsequent encounter for closed fracture with malunion: Secondary | ICD-10-CM | POA: Diagnosis not present

## 2020-10-11 DIAGNOSIS — S82209A Unspecified fracture of shaft of unspecified tibia, initial encounter for closed fracture: Secondary | ICD-10-CM | POA: Insufficient documentation

## 2020-10-27 DIAGNOSIS — R2689 Other abnormalities of gait and mobility: Secondary | ICD-10-CM | POA: Diagnosis not present

## 2020-10-27 DIAGNOSIS — L578 Other skin changes due to chronic exposure to nonionizing radiation: Secondary | ICD-10-CM | POA: Diagnosis not present

## 2020-10-27 DIAGNOSIS — L900 Lichen sclerosus et atrophicus: Secondary | ICD-10-CM | POA: Diagnosis not present

## 2020-10-27 DIAGNOSIS — L570 Actinic keratosis: Secondary | ICD-10-CM | POA: Diagnosis not present

## 2020-11-01 DIAGNOSIS — R2689 Other abnormalities of gait and mobility: Secondary | ICD-10-CM | POA: Diagnosis not present

## 2020-11-03 DIAGNOSIS — R2689 Other abnormalities of gait and mobility: Secondary | ICD-10-CM | POA: Diagnosis not present

## 2020-11-07 DIAGNOSIS — M13842 Other specified arthritis, left hand: Secondary | ICD-10-CM | POA: Diagnosis not present

## 2020-11-07 DIAGNOSIS — M13841 Other specified arthritis, right hand: Secondary | ICD-10-CM | POA: Diagnosis not present

## 2020-11-08 DIAGNOSIS — R2689 Other abnormalities of gait and mobility: Secondary | ICD-10-CM | POA: Diagnosis not present

## 2020-11-10 DIAGNOSIS — R2689 Other abnormalities of gait and mobility: Secondary | ICD-10-CM | POA: Diagnosis not present

## 2020-11-15 DIAGNOSIS — R2689 Other abnormalities of gait and mobility: Secondary | ICD-10-CM | POA: Diagnosis not present

## 2020-11-22 DIAGNOSIS — H35363 Drusen (degenerative) of macula, bilateral: Secondary | ICD-10-CM | POA: Diagnosis not present

## 2020-11-22 DIAGNOSIS — H35033 Hypertensive retinopathy, bilateral: Secondary | ICD-10-CM | POA: Diagnosis not present

## 2020-11-22 DIAGNOSIS — H25013 Cortical age-related cataract, bilateral: Secondary | ICD-10-CM | POA: Diagnosis not present

## 2020-11-22 DIAGNOSIS — H35371 Puckering of macula, right eye: Secondary | ICD-10-CM | POA: Diagnosis not present

## 2020-11-22 DIAGNOSIS — H2513 Age-related nuclear cataract, bilateral: Secondary | ICD-10-CM | POA: Diagnosis not present

## 2020-11-23 DIAGNOSIS — M5382 Other specified dorsopathies, cervical region: Secondary | ICD-10-CM | POA: Diagnosis not present

## 2020-11-23 DIAGNOSIS — M50322 Other cervical disc degeneration at C5-C6 level: Secondary | ICD-10-CM | POA: Diagnosis not present

## 2020-11-23 DIAGNOSIS — M9901 Segmental and somatic dysfunction of cervical region: Secondary | ICD-10-CM | POA: Diagnosis not present

## 2020-11-23 DIAGNOSIS — M9902 Segmental and somatic dysfunction of thoracic region: Secondary | ICD-10-CM | POA: Diagnosis not present

## 2020-11-24 DIAGNOSIS — R2689 Other abnormalities of gait and mobility: Secondary | ICD-10-CM | POA: Diagnosis not present

## 2020-11-24 DIAGNOSIS — M79672 Pain in left foot: Secondary | ICD-10-CM | POA: Diagnosis not present

## 2020-12-06 DIAGNOSIS — M50322 Other cervical disc degeneration at C5-C6 level: Secondary | ICD-10-CM | POA: Diagnosis not present

## 2020-12-06 DIAGNOSIS — M9902 Segmental and somatic dysfunction of thoracic region: Secondary | ICD-10-CM | POA: Diagnosis not present

## 2020-12-06 DIAGNOSIS — M5382 Other specified dorsopathies, cervical region: Secondary | ICD-10-CM | POA: Diagnosis not present

## 2020-12-06 DIAGNOSIS — M9901 Segmental and somatic dysfunction of cervical region: Secondary | ICD-10-CM | POA: Diagnosis not present

## 2020-12-21 DIAGNOSIS — I10 Essential (primary) hypertension: Secondary | ICD-10-CM | POA: Diagnosis not present

## 2020-12-21 DIAGNOSIS — E781 Pure hyperglyceridemia: Secondary | ICD-10-CM | POA: Diagnosis not present

## 2020-12-21 DIAGNOSIS — G4700 Insomnia, unspecified: Secondary | ICD-10-CM | POA: Diagnosis not present

## 2020-12-21 DIAGNOSIS — K580 Irritable bowel syndrome with diarrhea: Secondary | ICD-10-CM | POA: Diagnosis not present

## 2021-01-15 DIAGNOSIS — L900 Lichen sclerosus et atrophicus: Secondary | ICD-10-CM | POA: Diagnosis not present

## 2021-01-15 DIAGNOSIS — L738 Other specified follicular disorders: Secondary | ICD-10-CM | POA: Diagnosis not present

## 2021-01-15 DIAGNOSIS — L570 Actinic keratosis: Secondary | ICD-10-CM | POA: Diagnosis not present

## 2021-01-15 DIAGNOSIS — L812 Freckles: Secondary | ICD-10-CM | POA: Diagnosis not present

## 2021-01-25 DIAGNOSIS — M47812 Spondylosis without myelopathy or radiculopathy, cervical region: Secondary | ICD-10-CM | POA: Diagnosis not present

## 2021-01-25 DIAGNOSIS — M4312 Spondylolisthesis, cervical region: Secondary | ICD-10-CM | POA: Diagnosis not present

## 2021-01-25 DIAGNOSIS — M5382 Other specified dorsopathies, cervical region: Secondary | ICD-10-CM | POA: Diagnosis not present

## 2021-01-25 DIAGNOSIS — M9901 Segmental and somatic dysfunction of cervical region: Secondary | ICD-10-CM | POA: Diagnosis not present

## 2021-01-27 DIAGNOSIS — Z03818 Encounter for observation for suspected exposure to other biological agents ruled out: Secondary | ICD-10-CM | POA: Diagnosis not present

## 2021-01-27 DIAGNOSIS — Z20822 Contact with and (suspected) exposure to covid-19: Secondary | ICD-10-CM | POA: Diagnosis not present

## 2021-01-29 ENCOUNTER — Emergency Department (HOSPITAL_COMMUNITY): Payer: BC Managed Care – PPO

## 2021-01-29 ENCOUNTER — Other Ambulatory Visit: Payer: Self-pay

## 2021-01-29 ENCOUNTER — Encounter (HOSPITAL_COMMUNITY): Payer: Self-pay | Admitting: Emergency Medicine

## 2021-01-29 ENCOUNTER — Emergency Department (HOSPITAL_COMMUNITY)
Admission: EM | Admit: 2021-01-29 | Discharge: 2021-01-29 | Disposition: A | Discharge status: Home / Self Care | Payer: BC Managed Care – PPO | Attending: Emergency Medicine | Admitting: Emergency Medicine

## 2021-01-29 DIAGNOSIS — Z85828 Personal history of other malignant neoplasm of skin: Secondary | ICD-10-CM | POA: Diagnosis not present

## 2021-01-29 DIAGNOSIS — Z87891 Personal history of nicotine dependence: Secondary | ICD-10-CM | POA: Diagnosis not present

## 2021-01-29 DIAGNOSIS — Z96612 Presence of left artificial shoulder joint: Secondary | ICD-10-CM | POA: Diagnosis not present

## 2021-01-29 DIAGNOSIS — U071 COVID-19: Secondary | ICD-10-CM | POA: Insufficient documentation

## 2021-01-29 DIAGNOSIS — R059 Cough, unspecified: Secondary | ICD-10-CM | POA: Diagnosis not present

## 2021-01-29 DIAGNOSIS — Z96653 Presence of artificial knee joint, bilateral: Secondary | ICD-10-CM | POA: Insufficient documentation

## 2021-01-29 DIAGNOSIS — E039 Hypothyroidism, unspecified: Secondary | ICD-10-CM | POA: Insufficient documentation

## 2021-01-29 DIAGNOSIS — I10 Essential (primary) hypertension: Secondary | ICD-10-CM | POA: Diagnosis not present

## 2021-01-29 DIAGNOSIS — Z79899 Other long term (current) drug therapy: Secondary | ICD-10-CM | POA: Insufficient documentation

## 2021-01-29 DIAGNOSIS — R509 Fever, unspecified: Secondary | ICD-10-CM | POA: Diagnosis not present

## 2021-01-29 LAB — CBC WITH DIFFERENTIAL/PLATELET
Abs Immature Granulocytes: 0.02 10*3/uL (ref 0.00–0.07)
Basophils Absolute: 0 10*3/uL (ref 0.0–0.1)
Basophils Relative: 1 %
Eosinophils Absolute: 0 10*3/uL (ref 0.0–0.5)
Eosinophils Relative: 1 %
HCT: 42.2 % (ref 36.0–46.0)
Hemoglobin: 14.2 g/dL (ref 12.0–15.0)
Immature Granulocytes: 0 %
Lymphocytes Relative: 22 %
Lymphs Abs: 1.3 10*3/uL (ref 0.7–4.0)
MCH: 31.3 pg (ref 26.0–34.0)
MCHC: 33.6 g/dL (ref 30.0–36.0)
MCV: 93 fL (ref 80.0–100.0)
Monocytes Absolute: 1.2 10*3/uL — ABNORMAL HIGH (ref 0.1–1.0)
Monocytes Relative: 20 %
Neutro Abs: 3.2 10*3/uL (ref 1.7–7.7)
Neutrophils Relative %: 56 %
Platelets: 279 10*3/uL (ref 150–400)
RBC: 4.54 MIL/uL (ref 3.87–5.11)
RDW: 12.3 % (ref 11.5–15.5)
WBC: 5.7 10*3/uL (ref 4.0–10.5)
nRBC: 0 % (ref 0.0–0.2)

## 2021-01-29 LAB — RESP PANEL BY RT-PCR (FLU A&B, COVID) ARPGX2
Influenza A by PCR: NEGATIVE
Influenza B by PCR: NEGATIVE
SARS Coronavirus 2 by RT PCR: POSITIVE — AB

## 2021-01-29 LAB — BASIC METABOLIC PANEL
Anion gap: 8 (ref 5–15)
BUN: 18 mg/dL (ref 8–23)
CO2: 27 mmol/L (ref 22–32)
Calcium: 9 mg/dL (ref 8.9–10.3)
Chloride: 96 mmol/L — ABNORMAL LOW (ref 98–111)
Creatinine, Ser: 0.7 mg/dL (ref 0.44–1.00)
GFR, Estimated: >60 mL/min (ref >60)
Glucose, Bld: 94 mg/dL (ref 70–99)
Potassium: 4.1 mmol/L (ref 3.5–5.1)
Sodium: 131 mmol/L — ABNORMAL LOW (ref 135–145)

## 2021-01-29 MED ORDER — NIRMATRELVIR/RITONAVIR (PAXLOVID)TABLET
3.0000 | ORAL_TABLET | Freq: Two times a day (BID) | ORAL | Status: DC
Start: 2021-01-29 — End: 2021-01-29

## 2021-01-29 MED ORDER — NIRMATRELVIR/RITONAVIR (PAXLOVID)TABLET: 3.0000 | ORAL_TABLET | Freq: Two times a day (BID) | ORAL | 0 refills | Status: AC

## 2021-01-29 NOTE — ED Provider Notes (Signed)
[facility] DEPT Provider Note   CSN: [id number] Arrival date & time: 01/29/21  1347     History Chief Complaint  Patient presents with   Covid Positive    Emma Swanson is a 80 y.o. female.  HPI  Patient with significant medical history of anxiety, skin cancer, GERD, hypertension presents to the emergency department with chief complaint of COVID-like symptoms.  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  She notes that she has a positive COVID test.  She endorses over the last 2 to 3 days she has developed fevers, chills, cough, throat pain, and generalized fatigue.  She does not endorse chest pain, shortness of breath, abdominal pain, nausea, vomiting, diarrhea or constipation.  She is up-to-date on her COVID vaccines, is not immunocompromise, she denies alleviating factors.  She has no PEs or DVTs, currently not on hormone therapy.  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   S/P left TKA 04/16/2018   S/P right TKA 11/25/2017    Past Surgical History:  Procedure Laterality Date   AUGMENTATION MAMMAPLASTY Bilateral    bilateral arthroscopic knee surgery      bone spur removal      BREAST BIOPSY     left ankle surgery - had pins      thyroid surgery - age 80      TOTAL KNEE ARTHROPLASTY Right 11/25/2017    Procedure: RIGHT TOTAL KNEE ARTHROPLASTY;  Surgeon: Paralee Cancel, MD;  Location: [facility];  Service: Orthopedics;  Laterality: Right;  70 mins   TOTAL KNEE ARTHROPLASTY Left 04/16/2018   Procedure: LEFT TOTAL KNEE ARTHROPLASTY;  Surgeon: Paralee Cancel, MD;  Location: [facility];  Service: Orthopedics;  Laterality: Left;  90 mins   TOTAL SHOULDER ARTHROPLASTY Left 02/10/2020   Procedure: TOTAL SHOULDER ARTHROPLASTY;  Surgeon: Justice Britain, MD;  Location: [facility];  Service: Orthopedics;  Laterality: Left;  162min     OB History   No obstetric history on file.     No family history on file.  Social History   Tobacco Use   Smoking status: Former    Pack years: 0.00   Smokeless tobacco: Never   Tobacco comments:    age of 61 until age 33   Vaping Use   Vaping Use: Never used  Substance Use Topics   Alcohol use: Yes    Comment: 2 drinks per nite    Drug use: Never    Home Medications Prior to Admission medications   Medication Sig Start Date End Date Taking? Authorizing Provider  nirmatrelvir/ritonavir EUA (PAXLOVID) TABS Take 3 tablets by mouth 2 (two) times daily for 5 days. Take nirmatrelvir (150 mg) two tablets twice daily for 5 days and ritonavir (100 mg)  one tablet twice daily for 5 days. 01/29/21 02/03/21 Yes Marcello Fennel, PA-C  Cholecalciferol (VITAMIN D3) 10000 units capsule Take 10,000 Units by mouth daily.    [provider]  Cyanocobalamin (B-12) 5000 MCG CAPS Take 5,000 mcg by mouth daily.    [provider]  HYDROcodone-acetaminophen (NORCO/VICODIN) 5-325 MG tablet Take 1 tablet by mouth every 4 (four) hours as needed for moderate pain. 06/25/20 06/25/21  Fransico Meadow, PA-C  ipratropium (ATROVENT) 0.06 % nasal spray Place 2-3 sprays into both nostrils daily.     [provider]  lisinopril-hydrochlorothiazide (PRINZIDE,ZESTORETIC) 10-12.5 MG tablet Take 1 tablet by mouth daily.    [provider]  metroNIDAZOLE (FLAGYL) 500 MG  tablet Take 1 tablet (500 mg total) by mouth 2 (two) times daily. 06/25/20   Fransico Meadow, PA-C  Multiple Minerals (CALCIUM-MAGNESIUM-ZINC) TABS Take 1 tablet by mouth daily.    [provider]  Multiple Vitamin (MULTIVITAMIN WITH MINERALS) TABS tablet Take 1 tablet by mouth daily.    [provider]  ondansetron (ZOFRAN ODT) 4 MG disintegrating tablet Take 1 tablet (4 mg total) by mouth every 8 (eight) hours as needed for nausea or vomiting. 06/25/20   Fransico Meadow, PA-C  Tetrahydrozoline HCl (VISINE OP) Place 1 drop into both eyes daily.     [provider]  triamcinolone cream (KENALOG) 0.1 % Apply 1 application topically daily as needed (itching).    [provider]  VIBERZI 75 MG TABS Take 75 mg by mouth daily.  11/20/19   [provider]  vitamin E 400 UNIT capsule Take 400 Units by mouth daily.    [provider]  zolpidem (AMBIEN) 10 MG tablet Take 10 mg by mouth at bedtime.     [provider]    Allergies    Other, Oxycodone, and Penicillins  Review of Systems   Review of Systems  Constitutional:  Positive for chills, fatigue and fever.  HENT:  Negative for congestion and sore throat.   Respiratory:  Negative for cough and shortness of breath.   Cardiovascular:  Negative for chest pain.  Gastrointestinal:  Negative for diarrhea, nausea and vomiting.  Musculoskeletal:  Negative for myalgias.  Neurological:  Negative for headaches.   Physical Exam Updated Vital Signs BP (!) 127/96 (BP Location: Right Arm)   Pulse (!) 101   Temp 100.2 F (37.9 C) (Oral)   Resp 18   Ht 5' 6.75" (1.695 m)   Wt 62.3 kg   SpO2 99%   BMI 21.66 kg/m   Physical Exam Vitals and nursing note reviewed.  Constitutional:      General: She is not in acute distress.    Appearance: She is not ill-appearing.  HENT:     Head: Normocephalic and atraumatic.     Nose: No congestion.     Mouth/Throat:     Mouth: Mucous membranes are  moist.     Pharynx: Oropharynx is clear. No oropharyngeal exudate or posterior oropharyngeal erythema.  Eyes:     Conjunctiva/sclera: Conjunctivae normal.  Cardiovascular:     Rate and Rhythm: Normal rate and regular rhythm.     Pulses: Normal pulses.     Heart sounds: No murmur heard.   No friction rub. No gallop.  Pulmonary:     Effort: No respiratory distress.     Breath sounds: No wheezing, rhonchi or rales.  Abdominal:     Palpations: Abdomen is soft.     Tenderness: There is  no abdominal tenderness. There is no right CVA tenderness or left CVA tenderness.  Musculoskeletal:     Right lower leg: No edema.     Left lower leg: No edema.  Skin:    General: Skin is warm and dry.  Neurological:     Mental Status: She is alert.  Psychiatric:        Mood and Affect: Mood normal.    ED Results / Procedures / Treatments   Labs (all labs ordered are listed, but only abnormal results are displayed) Labs Reviewed  BASIC METABOLIC PANEL - Abnormal; Notable for the following components:      Result Value   Sodium 131 (*)    Chloride 96 (*)    All other components within normal limits  CBC WITH DIFFERENTIAL/PLATELET - Abnormal; Notable for the following components:   Monocytes Absolute 1.2 (*)    All other components within normal limits  RESP PANEL BY RT-PCR (FLU A&B, COVID) [id number]    EKG None  Radiology DG Chest Port 1 View  Result Date: 01/29/2021 CLINICAL DATA:  Cough.  Positive COVID test. EXAM: PORTABLE CHEST 1 VIEW COMPARISON:  None. FINDINGS: Heart size upper limits of normal. Atherosclerotic changes are noted in the aorta. No edema or effusion is present. No focal airspace disease is present. Left shoulder replacement noted. Mild rightward curvature is present in the thoracic spine with asymmetric degenerative changes on the left. IMPRESSION: 1. No acute cardiopulmonary disease. 2. Atherosclerosis. 3. Thoracic curvature. Electronically Signed   By: San Morelle  M.D.   On: 01/29/2021 16:58    Procedures Procedures   Medications Ordered in ED Medications - No data to display  ED Course  I have reviewed the triage vital signs and the nursing notes.  Pertinent labs & imaging results that were available during my care of the patient were reviewed by me and considered in my medical decision making (see chart for details).    MDM Rules/Calculators/A&P                         Initial impression-patient presents with URI-like symptoms, she is alert, does not appear in distress, vital signs show tachycardia and a fever.  Will obtain basic lab work-up chest x-ray and reassess.  Work-up-CBC unremarkable, BMP shows hyponatremia 131, chloride 96, chest x-ray unremarkable.  Rule out- Low suspicion for systemic infection as patient is nontoxic-appearing, vital signs reassuring, no obvious source infection noted on exam.  Low suspicion for pneumonia as lung sounds are clear bilaterally, x-ray did not reveal any acute findings.  I have low suspicion for PE as patient denies pleuritic chest pain, shortness of breath no unilateral leg swelling, vital signs reassuring.. low suspicion for strep throat as oropharynx was visualized, no erythema or exudates noted.  Low suspicion patient would need  hospitalized due to viral infection or Covid as vital signs reassuring, patient is not in respiratory distress.    Plan-  COVID-suspect patient symptoms are secondary to COVID, due to patient age as well as having asthma will recommend antivirals for further treatment.  We will have her follow-up with post COVID care for further evaluation.  Vital signs have remained stable, no indication for hospital admission.  Patient discussed with attending and they agreed with assessment and plan.  Patient given at home care as well strict return precautions.  Patient verbalized that they understood agreed to said plan.  Final Clinical Impression(s) / ED Diagnoses Final diagnoses:  COVID    Rx / DC Orders ED Discharge Orders          Ordered    nirmatrelvir/ritonavir EUA (PAXLOVID) TABS  2 times daily        01/29/21 1755             Marcello Fennel, PA-C 01/29/21 [place], [place], DO 01/30/21 1605

## 2021-01-29 NOTE — ED Triage Notes (Signed)
Patient reports testing positive for Covid today. C/o sore throat and body aches.

## 2021-01-29 NOTE — Discharge Instructions (Addendum)
You have been seen here for URI like symptoms.  I have started on antiviral please take as prescribed.  I recommend taking Tylenol for fever control and ibuprofen for pain control please follow dosing on the back of bottle.  I recommend staying hydrated and if you do not an appetite, I recommend soups as this will provide you with fluids and calories.  Your Covid test is pending I recommend self quarantine until you get your results back on MyChart.    If you are Covid positive you must self quarantine for 5 days starting on symptom onset, if at the end of those 5 days you are feeling better you may return back to school/work, if you continue to have symptoms you must self quarantine for additional 5 days.  I would like you to contact "post Covid care" as they will provide you with information how to manage your Covid symptoms if you are Covid positive.  Or if you are Covid negative and continue of symptoms after 1 to 2 weeks you may follow-up with your primary care provider  Come back to the emergency department if you develop chest pain, shortness of breath, severe abdominal pain, uncontrolled nausea, vomiting, diarrhea.

## 2021-02-15 DIAGNOSIS — M4312 Spondylolisthesis, cervical region: Secondary | ICD-10-CM | POA: Diagnosis not present

## 2021-02-15 DIAGNOSIS — M5382 Other specified dorsopathies, cervical region: Secondary | ICD-10-CM | POA: Diagnosis not present

## 2021-02-15 DIAGNOSIS — M47812 Spondylosis without myelopathy or radiculopathy, cervical region: Secondary | ICD-10-CM | POA: Diagnosis not present

## 2021-02-15 DIAGNOSIS — M9901 Segmental and somatic dysfunction of cervical region: Secondary | ICD-10-CM | POA: Diagnosis not present

## 2021-02-16 DIAGNOSIS — M25511 Pain in right shoulder: Secondary | ICD-10-CM | POA: Diagnosis not present

## 2021-02-16 DIAGNOSIS — M1812 Unilateral primary osteoarthritis of first carpometacarpal joint, left hand: Secondary | ICD-10-CM | POA: Diagnosis not present

## 2021-02-16 DIAGNOSIS — M1811 Unilateral primary osteoarthritis of first carpometacarpal joint, right hand: Secondary | ICD-10-CM | POA: Diagnosis not present

## 2021-02-16 DIAGNOSIS — Z96612 Presence of left artificial shoulder joint: Secondary | ICD-10-CM | POA: Diagnosis not present

## 2021-02-16 DIAGNOSIS — M180 Bilateral primary osteoarthritis of first carpometacarpal joints: Secondary | ICD-10-CM | POA: Diagnosis not present

## 2021-02-19 DIAGNOSIS — M47812 Spondylosis without myelopathy or radiculopathy, cervical region: Secondary | ICD-10-CM | POA: Diagnosis not present

## 2021-02-19 DIAGNOSIS — M9901 Segmental and somatic dysfunction of cervical region: Secondary | ICD-10-CM | POA: Diagnosis not present

## 2021-02-19 DIAGNOSIS — M4312 Spondylolisthesis, cervical region: Secondary | ICD-10-CM | POA: Diagnosis not present

## 2021-02-19 DIAGNOSIS — M5382 Other specified dorsopathies, cervical region: Secondary | ICD-10-CM | POA: Diagnosis not present

## 2021-02-27 DIAGNOSIS — M5382 Other specified dorsopathies, cervical region: Secondary | ICD-10-CM | POA: Diagnosis not present

## 2021-02-27 DIAGNOSIS — M47812 Spondylosis without myelopathy or radiculopathy, cervical region: Secondary | ICD-10-CM | POA: Diagnosis not present

## 2021-02-27 DIAGNOSIS — M9901 Segmental and somatic dysfunction of cervical region: Secondary | ICD-10-CM | POA: Diagnosis not present

## 2021-02-27 DIAGNOSIS — M4312 Spondylolisthesis, cervical region: Secondary | ICD-10-CM | POA: Diagnosis not present

## 2021-03-15 DIAGNOSIS — S93402A Sprain of unspecified ligament of left ankle, initial encounter: Secondary | ICD-10-CM | POA: Diagnosis not present

## 2021-04-16 DIAGNOSIS — S42021A Displaced fracture of shaft of right clavicle, initial encounter for closed fracture: Secondary | ICD-10-CM | POA: Diagnosis not present

## 2021-04-16 DIAGNOSIS — S42001A Fracture of unspecified part of right clavicle, initial encounter for closed fracture: Secondary | ICD-10-CM | POA: Insufficient documentation

## 2021-04-16 DIAGNOSIS — S42031A Displaced fracture of lateral end of right clavicle, initial encounter for closed fracture: Secondary | ICD-10-CM | POA: Diagnosis not present

## 2021-04-24 DIAGNOSIS — G8918 Other acute postprocedural pain: Secondary | ICD-10-CM | POA: Diagnosis not present

## 2021-04-24 DIAGNOSIS — W19XXXA Unspecified fall, initial encounter: Secondary | ICD-10-CM | POA: Diagnosis not present

## 2021-04-24 DIAGNOSIS — I890 Lymphedema, not elsewhere classified: Secondary | ICD-10-CM | POA: Diagnosis not present

## 2021-04-24 DIAGNOSIS — Z4889 Encounter for other specified surgical aftercare: Secondary | ICD-10-CM | POA: Diagnosis not present

## 2021-04-24 DIAGNOSIS — S43031A Inferior subluxation of right humerus, initial encounter: Secondary | ICD-10-CM | POA: Diagnosis not present

## 2021-04-24 DIAGNOSIS — M25511 Pain in right shoulder: Secondary | ICD-10-CM | POA: Diagnosis not present

## 2021-04-24 DIAGNOSIS — S42031A Displaced fracture of lateral end of right clavicle, initial encounter for closed fracture: Secondary | ICD-10-CM | POA: Diagnosis not present

## 2021-04-24 DIAGNOSIS — X58XXXA Exposure to other specified factors, initial encounter: Secondary | ICD-10-CM | POA: Diagnosis not present

## 2021-05-03 ENCOUNTER — Encounter: Payer: Self-pay | Admitting: Registered Nurse

## 2021-05-03 ENCOUNTER — Other Ambulatory Visit: Payer: Self-pay

## 2021-05-03 ENCOUNTER — Ambulatory Visit (INDEPENDENT_AMBULATORY_CARE_PROVIDER_SITE_OTHER): Payer: BC Managed Care – PPO | Admitting: Registered Nurse

## 2021-05-03 VITALS — BP 102/73 | HR 70 | Temp 97.7°F | Resp 18 | Ht 66.0 in | Wt 138.0 lb

## 2021-05-03 DIAGNOSIS — K219 Gastro-esophageal reflux disease without esophagitis: Secondary | ICD-10-CM

## 2021-05-03 DIAGNOSIS — Z1329 Encounter for screening for other suspected endocrine disorder: Secondary | ICD-10-CM | POA: Diagnosis not present

## 2021-05-03 DIAGNOSIS — Z13228 Encounter for screening for other metabolic disorders: Secondary | ICD-10-CM

## 2021-05-03 DIAGNOSIS — Z130 Encounter for screening for diseases of the blood and blood-forming organs and certain disorders involving the immune mechanism: Secondary | ICD-10-CM | POA: Diagnosis not present

## 2021-05-03 DIAGNOSIS — G4700 Insomnia, unspecified: Secondary | ICD-10-CM | POA: Diagnosis not present

## 2021-05-03 DIAGNOSIS — Z1159 Encounter for screening for other viral diseases: Secondary | ICD-10-CM | POA: Diagnosis not present

## 2021-05-03 DIAGNOSIS — Z8639 Personal history of other endocrine, nutritional and metabolic disease: Secondary | ICD-10-CM | POA: Diagnosis not present

## 2021-05-03 DIAGNOSIS — Z23 Encounter for immunization: Secondary | ICD-10-CM

## 2021-05-03 DIAGNOSIS — K582 Mixed irritable bowel syndrome: Secondary | ICD-10-CM

## 2021-05-03 DIAGNOSIS — Z13220 Encounter for screening for lipoid disorders: Secondary | ICD-10-CM

## 2021-05-03 LAB — URINALYSIS, ROUTINE W REFLEX MICROSCOPIC
Bilirubin Urine: NEGATIVE
Hgb urine dipstick: NEGATIVE
Ketones, ur: NEGATIVE
Nitrite: NEGATIVE
Specific Gravity, Urine: 1.015 (ref 1.000–1.030)
Total Protein, Urine: NEGATIVE
Urine Glucose: NEGATIVE
Urobilinogen, UA: 0.2 (ref 0.0–1.0)
pH: 6.5 (ref 5.0–8.0)

## 2021-05-03 LAB — CBC WITH DIFFERENTIAL/PLATELET
Basophils Absolute: 0.1 10*3/uL (ref 0.0–0.1)
Basophils Relative: 1.2 % (ref 0.0–3.0)
Eosinophils Absolute: 0.1 10*3/uL (ref 0.0–0.7)
Eosinophils Relative: 3.6 % (ref 0.0–5.0)
HCT: 39 % (ref 36.0–46.0)
Hemoglobin: 13.4 g/dL (ref 12.0–15.0)
Lymphocytes Relative: 34.1 % (ref 12.0–46.0)
Lymphs Abs: 1.4 10*3/uL (ref 0.7–4.0)
MCHC: 34.3 g/dL (ref 30.0–36.0)
MCV: 90.9 fl (ref 78.0–100.0)
Monocytes Absolute: 0.7 10*3/uL (ref 0.1–1.0)
Monocytes Relative: 15.7 % — ABNORMAL HIGH (ref 3.0–12.0)
Neutro Abs: 1.9 10*3/uL (ref 1.4–7.7)
Neutrophils Relative %: 45.4 % (ref 43.0–77.0)
Platelets: 467 10*3/uL — ABNORMAL HIGH (ref 150.0–400.0)
RBC: 4.29 Mil/uL (ref 3.87–5.11)
RDW: 12.8 % (ref 11.5–15.5)
WBC: 4.1 10*3/uL (ref 4.0–10.5)

## 2021-05-03 LAB — COMPREHENSIVE METABOLIC PANEL
ALT: 24 U/L (ref 0–35)
AST: 24 U/L (ref 0–37)
Albumin: 4.1 g/dL (ref 3.5–5.2)
Alkaline Phosphatase: 94 U/L (ref 39–117)
BUN: 20 mg/dL (ref 6–23)
CO2: 29 mEq/L (ref 19–32)
Calcium: 9.4 mg/dL (ref 8.4–10.5)
Chloride: 94 mEq/L — ABNORMAL LOW (ref 96–112)
Creatinine, Ser: 0.76 mg/dL (ref 0.40–1.20)
GFR: 74.6 mL/min (ref >60.00)
Glucose, Bld: 79 mg/dL (ref 70–99)
Potassium: 4.5 mEq/L (ref 3.5–5.1)
Sodium: 131 mEq/L — ABNORMAL LOW (ref 135–145)
Total Bilirubin: 0.7 mg/dL (ref 0.2–1.2)
Total Protein: 7 g/dL (ref 6.0–8.3)

## 2021-05-03 LAB — TSH: TSH: 1.41 u[IU]/mL (ref 0.35–5.50)

## 2021-05-03 LAB — LIPID PANEL
Cholesterol: 200 mg/dL (ref 0–200)
HDL: 54.7 mg/dL (ref >39.00)
NonHDL: 145.32
Total CHOL/HDL Ratio: 4
Triglycerides: 223 mg/dL — ABNORMAL HIGH (ref 0.0–149.0)
VLDL: 44.6 mg/dL — ABNORMAL HIGH (ref 0.0–40.0)

## 2021-05-03 LAB — HEMOGLOBIN A1C: Hgb A1c MFr Bld: 5.8 % (ref 4.6–6.5)

## 2021-05-03 LAB — LDL CHOLESTEROL, DIRECT: Direct LDL: 114 mg/dL

## 2021-05-03 MED ORDER — ZOLPIDEM TARTRATE 10 MG PO TABS: 10.0000 mg | ORAL_TABLET | Freq: Every day | ORAL | 0 refills | Status: DC

## 2021-05-03 MED ORDER — DICYCLOMINE HCL 10 MG PO CAPS: 10.0000 mg | ORAL_CAPSULE | Freq: Three times a day (TID) | ORAL | 2 refills | Status: DC

## 2021-05-03 MED ORDER — PANTOPRAZOLE SODIUM 40 MG PO TBEC: 40.0000 mg | DELAYED_RELEASE_TABLET | Freq: Every day | ORAL | 3 refills | Status: DC

## 2021-05-03 MED ORDER — VIBERZI 75 MG PO TABS: 75.0000 mg | ORAL_TABLET | Freq: Two times a day (BID) | ORAL | 1 refills | Status: DC

## 2021-05-03 NOTE — Patient Instructions (Addendum)
  Ms. Albirtha Grinage to meet you. Glad the shoulder is healing up well.  Labs today will be back this afternoon. I'll call with anything urgent.  Meds refilled. Try out dicyclomine 10mg  po tid. If it doesn't work, I have sent refill on viberzi. Referral to GI placed. They should call in a few weeks  Reasonable to try acid reducer Pantoprazole 40mg  daily for two weeks to see if that helps with bloated/fullness sensation. Take first thing in AM before any meals with a big glass of water.  Refilled zolpidem - will provide monthly refills x 6 mo  Thank you  Rich    If you have lab work done today you will be contacted with your lab results within the next 2 weeks.  If you have not heard from [name] then please contact us. The fastest way to get your results is to register for My Chart.   IF you received an x-ray today, you will receive an invoice from [facility]. Please contact [facility] at [phone] with questions or concerns regarding your invoice.   IF you received labwork today, you will receive an invoice from [facility]. Please contact [facility] at [phone] with questions or concerns regarding your invoice.   Our billing staff will not be able to assist you with questions regarding bills from these companies.  You will be contacted with the lab results as soon as they are available. The fastest way to get your results is to activate your My Chart account. Instructions are located on the last page of this paperwork. If you have not heard from [name] regarding the results in 2 weeks, please contact this office.

## 2021-05-03 NOTE — Progress Notes (Signed)
New Patient Office Visit  Subjective:  Patient ID: Emma Swanson, female    DOB: 11-24-1940  Age: 80 y.o. MRN: [id number]  CC:  Chief Complaint  Patient presents with   New Patient (Initial Visit)    Patient states she is here to establish care and medication refills    HPI Emma Swanson presents for visit to est and med refills.  Histories reviewed and updated with patient.   Hypertension: Patient Currently taking: lisinopril-hctz 10-12.5mg  po qd. Good effect. No AEs. Denies CV symptoms including: chest pain, shob, doe, headache, visual changes, fatigue, claudication, and dependent edema.   Previous readings and labs: BP Readings from Last 3 Encounters:  05/03/21 102/73  01/29/21 113/87  06/25/20 114/74   Lab Results  Component Value Date   CREATININE 0.70 01/29/2021   IBS Using Viberzi 75mg  po bid. Good effect, but AE of itching Has not been on other meds in the past IBS mixed C and D.  Pt aware of status as controlled substance.  Colonoscopy within past 4 years. No polyps. Done by Dr. Alan Ripper at [facility]. Also has hx notable for GERD   Insomnia Intermittent use of Zolpidem 10mg  po qd Last fill, per pdmp, was on 03/10/21 for 30 tabs with past PCP Dr. Nancy Fetter.  Pt aware of status as controlled substance and place on [place] for patients 65+. Has used trazodone, seroquel, melatonin, and benzodiazepines in the past with limited effect.  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  Continues to follow with [facility] most recently Dr. Garlan Fillers. No acute concerns at this time.   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 Onnie Graham 1 week ago Doing well.   Past Medical History:  Diagnosis Date   Anxiety    Arthritis    Cancer ([place])     hx of skin cancers    Family history of adverse reaction to anesthesia    see malignant hyperthermia tab    GERD (gastroesophageal reflux disease)    Hypertension    Hypothyroidism    Malignant hyperthermia    cousin's son- - malignant hyperthermia al most died  ,  patient's brother and his 2 sons tested positive for malignant hyperthermia   Palpitations    PONV (postoperative nausea and vomiting)     Past Surgical History:  Procedure Laterality Date   AUGMENTATION MAMMAPLASTY Bilateral    bilateral arthroscopic knee surgery      bone spur removal      BREAST BIOPSY     left ankle surgery - had pins      thyroid surgery - age 80      TOTAL KNEE ARTHROPLASTY Right 11/25/2017   Procedure: RIGHT TOTAL KNEE ARTHROPLASTY;  Surgeon: Paralee Cancel, MD;  Location: [facility];  Service: Orthopedics;  Laterality: Right;  70 mins   TOTAL KNEE ARTHROPLASTY Left 04/16/2018   Procedure: LEFT TOTAL KNEE ARTHROPLASTY;  Surgeon: Paralee Cancel, MD;  Location: [facility];  Service: Orthopedics;  Laterality: Left;  90 mins   TOTAL SHOULDER ARTHROPLASTY Left 02/10/2020   Procedure: TOTAL SHOULDER ARTHROPLASTY;  Surgeon: Justice Britain, MD;  Location: [facility];  Service: Orthopedics;  Laterality: Left;  195min    Family History  Problem Relation Age of Onset   Cancer Mother  Cancer Other    Pancreatic cancer Other    Malignant hyperthermia Other     Social History   Socioeconomic History   Marital status: Single    Spouse name: Not on file   Number of children: Not on file   Years of education: Not on file   Highest education level: Not on file  Occupational History   Not on file  Tobacco Use   Smoking status: Former   Smokeless tobacco: Never   Tobacco comments:    age of 35 until age 67   Vaping Use   Vaping Use: Never used  Substance and Sexual Activity   Alcohol use: Yes    Comment: 2 drinks per nite    Drug use: Never   Sexual activity: Not Currently  Other Topics Concern   Not on file   Social History Narrative   Not on file   Social Determinants of Health   Financial Resource Strain: Not on file  Food Insecurity: Not on file  Transportation Needs: Not on file  Physical Activity: Not on file  Stress: Not on file  Social Connections: Not on file  Intimate Partner Violence: Not on file    ROS Review of Systems  Constitutional: Negative.   HENT: Negative.    Eyes: Negative.   Respiratory: Negative.    Cardiovascular: Negative.   Gastrointestinal: Negative.   Genitourinary: Negative.   Musculoskeletal: Negative.   Skin: Negative.   Neurological: Negative.   Psychiatric/Behavioral: Negative.    All other systems reviewed and are negative.  Objective:   Today's Vitals: BP 102/73   Pulse 70   Temp 97.7 F (36.5 C) (Temporal)   Resp 18   Ht 5\' 6"  (1.676 m)   Wt 138 lb (62.6 kg)   SpO2 99%   BMI 22.27 kg/m   Physical Exam Vitals and nursing note reviewed.  Constitutional:      General: She is not in acute distress.    Appearance: Normal appearance. She is not ill-appearing, toxic-appearing or diaphoretic.  Cardiovascular:     Rate and Rhythm: Normal rate and regular rhythm.     Pulses: Normal pulses.     Heart sounds: Normal heart sounds. No murmur heard.   No friction rub. No gallop.  Pulmonary:     Effort: Pulmonary effort is normal. No respiratory distress.     Breath sounds: Normal breath sounds. No stridor. No wheezing, rhonchi or rales.  Chest:     Chest wall: No tenderness.  Skin:    General: Skin is warm and dry.     Capillary Refill: Capillary refill takes less than 2 seconds.  Neurological:     General: No focal deficit present.     Mental Status: She is alert and oriented to person, place, and time. Mental status is at baseline.  Psychiatric:        Mood and Affect: Mood normal.        Behavior: Behavior normal.        Thought Content: Thought content normal.        Judgment: Judgment normal.    Assessment & Plan:   Problem  List Items Addressed This Visit   None Visit Diagnoses     Encounter for screening for other viral diseases    -  Primary   Relevant Orders   Hepatitis C antibody   Screening for endocrine, metabolic and immunity disorder       Relevant Orders   CBC with Differential/Platelet  Comprehensive metabolic panel   TSH   Lipid screening       Relevant Orders   Lipid panel   History of elevated glucose       Relevant Orders   Hemoglobin A1c   Flu vaccine need       Relevant Orders   Flu Vaccine QUAD High Dose(Fluad) (Completed)   Insomnia, unspecified type       Relevant Medications   zolpidem (AMBIEN) 10 MG tablet   Irritable bowel syndrome with both constipation and diarrhea       Relevant Medications   dicyclomine (BENTYL) 10 MG capsule   VIBERZI 75 MG TABS   pantoprazole (PROTONIX) 40 MG tablet   Other Relevant Orders   Ambulatory referral to Gastroenterology   Gastroesophageal reflux disease without esophagitis       Relevant Medications   dicyclomine (BENTYL) 10 MG capsule   VIBERZI 75 MG TABS   pantoprazole (PROTONIX) 40 MG tablet   Other Relevant Orders   Ambulatory referral to Gastroenterology       Outpatient Encounter Medications as of 05/03/2021  Medication Sig   Cholecalciferol (VITAMIN D3) 10000 units capsule Take 10,000 Units by mouth daily.   Cyanocobalamin (B-12) 5000 MCG CAPS Take 5,000 mcg by mouth daily.   dicyclomine (BENTYL) 10 MG capsule Take 1 capsule (10 mg total) by mouth 3 (three) times daily before meals.   HYDROcodone-acetaminophen (NORCO/VICODIN) 5-325 MG tablet Take 1 tablet by mouth every 4 (four) hours as needed for moderate pain.   ipratropium (ATROVENT) 0.06 % nasal spray Place 2-3 sprays into both nostrils daily.    lisinopril-hydrochlorothiazide (PRINZIDE,ZESTORETIC) 10-12.5 MG tablet Take 1 tablet by mouth daily.   metroNIDAZOLE (FLAGYL) 500 MG tablet Take 1 tablet (500 mg total) by mouth 2 (two) times daily.   Multiple Minerals  (CALCIUM-MAGNESIUM-ZINC) TABS Take 1 tablet by mouth daily.   Multiple Vitamin (MULTIVITAMIN WITH MINERALS) TABS tablet Take 1 tablet by mouth daily.   ondansetron (ZOFRAN ODT) 4 MG disintegrating tablet Take 1 tablet (4 mg total) by mouth every 8 (eight) hours as needed for nausea or vomiting.   pantoprazole (PROTONIX) 40 MG tablet Take 1 tablet (40 mg total) by mouth daily.   Tetrahydrozoline HCl (VISINE OP) Place 1 drop into both eyes daily.   triamcinolone cream (KENALOG) 0.1 % Apply 1 application topically daily as needed (itching).   vitamin E 400 UNIT capsule Take 400 Units by mouth daily.   [DISCONTINUED] VIBERZI 75 MG TABS Take 75 mg by mouth daily.    [DISCONTINUED] zolpidem (AMBIEN) 10 MG tablet Take 10 mg by mouth at bedtime.    VIBERZI 75 MG TABS Take 75 mg by mouth 2 (two) times daily.   zolpidem (AMBIEN) 10 MG tablet Take 1 tablet (10 mg total) by mouth at bedtime.   No facility-administered encounter medications on file as of 05/03/2021.    Follow-up: Return in about 6 months (around 11/01/2021) for HTN / zolpidem.   PLAN Labs collected. Will follow up with the patient as warranted. Refill meds x 6 mo Try dicyclomine rather than Viberzi. Refer to GI. Follow up prn Bp well controlled. Continue current regimen. Patient encouraged to call clinic with any questions, comments, or concerns.  Maximiano Coss, NP

## 2021-05-04 LAB — HEPATITIS C ANTIBODY
Hepatitis C Ab: NONREACTIVE
SIGNAL TO CUT-OFF: 0.03 (ref <1.00)

## 2021-05-07 DIAGNOSIS — S43031A Inferior subluxation of right humerus, initial encounter: Secondary | ICD-10-CM | POA: Diagnosis not present

## 2021-05-07 DIAGNOSIS — S42031A Displaced fracture of lateral end of right clavicle, initial encounter for closed fracture: Secondary | ICD-10-CM | POA: Diagnosis not present

## 2021-05-07 DIAGNOSIS — I890 Lymphedema, not elsewhere classified: Secondary | ICD-10-CM | POA: Diagnosis not present

## 2021-05-07 DIAGNOSIS — Z4889 Encounter for other specified surgical aftercare: Secondary | ICD-10-CM | POA: Diagnosis not present

## 2021-05-07 DIAGNOSIS — M25511 Pain in right shoulder: Secondary | ICD-10-CM | POA: Diagnosis not present

## 2021-05-18 ENCOUNTER — Telehealth: Payer: Self-pay | Admitting: Gastroenterology

## 2021-05-18 NOTE — Telephone Encounter (Signed)
Called patient to schedule left voicemail. 

## 2021-05-25 ENCOUNTER — Encounter: Payer: Self-pay | Admitting: Gastroenterology

## 2021-05-25 NOTE — Telephone Encounter (Signed)
That's fine. I'm happy to see her, can book an office appointment with me.

## 2021-05-30 NOTE — Telephone Encounter (Signed)
Patient has been scheduled for 06/28/21

## 2021-06-02 ENCOUNTER — Other Ambulatory Visit: Payer: Self-pay | Admitting: Registered Nurse

## 2021-06-02 DIAGNOSIS — G4700 Insomnia, unspecified: Secondary | ICD-10-CM

## 2021-06-06 DIAGNOSIS — S42031D Displaced fracture of lateral end of right clavicle, subsequent encounter for fracture with routine healing: Secondary | ICD-10-CM | POA: Diagnosis not present

## 2021-06-26 DIAGNOSIS — S42031G Displaced fracture of lateral end of right clavicle, subsequent encounter for fracture with delayed healing: Secondary | ICD-10-CM | POA: Diagnosis not present

## 2021-06-26 DIAGNOSIS — M25511 Pain in right shoulder: Secondary | ICD-10-CM | POA: Diagnosis not present

## 2021-06-26 DIAGNOSIS — S42031A Displaced fracture of lateral end of right clavicle, initial encounter for closed fracture: Secondary | ICD-10-CM | POA: Diagnosis not present

## 2021-06-26 DIAGNOSIS — Z4889 Encounter for other specified surgical aftercare: Secondary | ICD-10-CM | POA: Diagnosis not present

## 2021-06-26 DIAGNOSIS — X58XXXA Exposure to other specified factors, initial encounter: Secondary | ICD-10-CM | POA: Diagnosis not present

## 2021-06-26 DIAGNOSIS — S42031D Displaced fracture of lateral end of right clavicle, subsequent encounter for fracture with routine healing: Secondary | ICD-10-CM | POA: Diagnosis not present

## 2021-06-26 DIAGNOSIS — I890 Lymphedema, not elsewhere classified: Secondary | ICD-10-CM | POA: Diagnosis not present

## 2021-06-26 DIAGNOSIS — Y999 Unspecified external cause status: Secondary | ICD-10-CM | POA: Diagnosis not present

## 2021-06-26 DIAGNOSIS — G8918 Other acute postprocedural pain: Secondary | ICD-10-CM | POA: Diagnosis not present

## 2021-06-28 ENCOUNTER — Encounter: Payer: Self-pay | Admitting: Gastroenterology

## 2021-06-28 ENCOUNTER — Ambulatory Visit (INDEPENDENT_AMBULATORY_CARE_PROVIDER_SITE_OTHER): Payer: BC Managed Care – PPO | Admitting: Gastroenterology

## 2021-06-28 VITALS — BP 124/80 | HR 88 | Ht 66.0 in | Wt 142.4 lb

## 2021-06-28 DIAGNOSIS — K529 Noninfective gastroenteritis and colitis, unspecified: Secondary | ICD-10-CM

## 2021-06-28 DIAGNOSIS — R140 Abdominal distension (gaseous): Secondary | ICD-10-CM | POA: Diagnosis not present

## 2021-06-28 DIAGNOSIS — Z789 Other specified health status: Secondary | ICD-10-CM | POA: Diagnosis not present

## 2021-06-28 MED ORDER — LOPERAMIDE HCL 2 MG PO TABS: 2.0000 mg | ORAL_TABLET | Freq: Every morning | ORAL | 0 refills | Status: AC

## 2021-06-28 MED ORDER — IBGARD 90 MG PO CPCR: ORAL_CAPSULE | ORAL | Status: DC

## 2021-06-28 NOTE — Patient Instructions (Signed)
If you are age 79 or older, your body mass index should be between 23-30. Your Body mass index is 22.98 kg/m. If this is out of the aforementioned range listed, please consider follow up with your Primary Care Provider.  If you are age 78 or younger, your body mass index should be between 19-25. Your Body mass index is 22.98 kg/m. If this is out of the aformentioned range listed, please consider follow up with your Primary Care Provider.   ________________________________________________________  The [facility] providers would like to encourage you to use [facility] to communicate with providers for non-urgent requests or questions.  Due to long hold times on the telephone, sending your provider a message by [facility] may be a faster and more efficient way to get a response.  Please allow 48 business hours for a response.  Please remember that this is for non-urgent requests.  _______________________________________________________   Rayetta Pigg.  We are giving you a Low-FODMAP diet handout today. FODMAPs are short-chain carbohydrates (sugars) that are highly fermentable, which means that they go through chemical changes in the GI system, and are poorly absorbed during digestion. When FODMAPs reach the colon (large intestine), bacteria ferment these sugars, turning them into gas and chemicals. This stretches the walls of the colon, causing abdominal bloating, distension, cramping, pain, and/or changes in bowel habits in many patients with IBS. FODMAPs are not unhealthy or harmful, but may exacerbate GI symptoms in those with sensitive GI tracts.  Please purchase the following medications over the counter and take as directed: IBGard: as needed Imodium: Take every morning and titrate up as needed  Please follow up in 3 to 4 months.  Thank you for entrusting me with your care and for choosing [facility], Dr. Rankin Cellar

## 2021-06-28 NOTE — Progress Notes (Signed)
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  She was previously followed by Dr. Alan Ripper at [place].  She states she was previously followed by [facility] for loose stools that had persisted for some time.  She had a colonoscopy with them in 2019 which diffusely showed lymphocytic colitis.  She was treated with a course of budesonide and they added cholestyramine to this for persistent symptoms, sounds like it did not help too much.  She endorsed being on NSAIDs in the past although unclear if she was on these at the time of that exam.  Given persistence of symptoms she had a flex sig in March 2019 which showed no evidence of lymphocytic colitis.  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  Per the notes in her old chart she failed a trial of rifaximin.  In May 2020 she was started on Viberzi which definitely slowed down her bowels and she has been on it since that time.  Since that time she has been taken Viberzi typically once daily dosed at 75 mg.  When she takes it she has usually 1 formed stool per day.  If she does not take it she will have several loose bowel movements in a day.  She has ongoing bloating and increased gas which she states predates her use of Viberzi.  She has abdominal cramps in her lower abdomen, bowel movement can help relieve this or passing gas.  Her gallbladder is in place.  She denies any blood in her stools.  She was using NSAIDs in the past but currently taking them only as needed.  More recently she had a right clavicle surgery and is taking low-dose NSAIDs for that.  She takes an over-the-counter vitamin supplements but no herbals.  When inquiring about her alcohol use in light of her Viberzi, she does endorse drinking routinely upwards of 1 bottle of wine per night, anywhere from 2 glasses to a bottle per night.  She has been doing this for some  time.  She denies any history of liver disease.  Her liver enzymes have been normal.  Liver normal on CT scan as of last December.  She has tested negative for celiac disease.  She inquires about other options.  She has never had pancreatitis in the past. She had a CT scan about a year ago showing some subtle inflammatory changes adjacent to her colon on the left side.  She denies any trials of dietary therapies in the past for her symptoms.   Flex sig 09/23/18 - no evidence of lymphocytic colitis on path  Celiac labs TTG IgA and IgG negative, total IgA normal  Colonoscopy 10/06/2017 - 90mm rectal adenoma, otherwise biopsies diffusely showed lymphocytic colitis  CT abdomen / pelvis 06/25/20 - IMPRESSION: 1. Subtle inflammatory changes are suggested adjacent to the decompressed left colon, which would support a mild inflammatory or infectious colitis in the proper clinical setting. This portion of the bowel is not well assessed due to lack of distension. 2. No other evidence of an acute abnormality within the abdomen or pelvis. 3. Aortic atherosclerosis     Past Medical History:  Diagnosis Date   Anxiety    Arthritis    Asthma    Cancer ([place])    hx of skin cancers    Colon polyps    Family history of adverse reaction to anesthesia  see malignant hyperthermia tab    GERD (gastroesophageal reflux disease)    Hypertension    Hypothyroidism    Malignant hyperthermia    cousin's son- - malignant hyperthermia al most died  ,  patient's brother and his 2 sons tested positive for malignant hyperthermia   Palpitations    PONV (postoperative nausea and vomiting)    Vitamin D deficiency      Past Surgical History:  Procedure Laterality Date   AUGMENTATION MAMMAPLASTY Bilateral    bilateral arthroscopic knee surgery      bone spur removal      BREAST BIOPSY     CLAVICLE SURGERY Right    COLONOSCOPY     left ankle surgery - had pins      thyroid surgery - age 16      TOTAL KNEE  ARTHROPLASTY Right 11/25/2017   Procedure: RIGHT TOTAL KNEE ARTHROPLASTY;  Surgeon: Paralee Cancel, MD;  Location: [facility];  Service: Orthopedics;  Laterality: Right;  70 mins   TOTAL KNEE ARTHROPLASTY Left 04/16/2018   Procedure: LEFT TOTAL KNEE ARTHROPLASTY;  Surgeon: Paralee Cancel, MD;  Location: [facility];  Service: Orthopedics;  Laterality: Left;  90 mins   TOTAL SHOULDER ARTHROPLASTY Left 02/10/2020   Procedure: TOTAL SHOULDER ARTHROPLASTY;  Surgeon: Justice Britain, MD;  Location: [facility];  Service: Orthopedics;  Laterality: Left;  178min   Family History  Problem Relation Age of Onset   Cancer Mother    Hypertension Brother    Diabetes Brother    Heart disease Brother    Malignant hyperthermia Brother    Pancreatic cancer Paternal Aunt    Cancer Other    Pancreatic cancer Other    Malignant hyperthermia Other    Colon cancer Neg Hx    Esophageal cancer Neg Hx    Stomach cancer Neg Hx    Social History   Tobacco Use   Smoking status: Former    Types: Cigarettes   Smokeless tobacco: Never   Tobacco comments:    age of 30 until age 89   Vaping Use   Vaping Use: Never used  Substance Use Topics   Alcohol use: Yes    Comment: 3 glasses of wine a night   Drug use: Never   Current Outpatient Medications  Medication Sig Dispense Refill   Eluxadoline (VIBERZI) 75 MG TABS Take 1 tablet by mouth daily.     HYDROcodone-acetaminophen (NORCO/VICODIN) 5-325 MG tablet hydrocodone 5 mg-acetaminophen 325 mg tablet  1-2 po q 6 hrs prn severe pain     ipratropium (ATROVENT) 0.06 % nasal spray Place 2-3 sprays into both nostrils daily.      lisinopril-hydrochlorothiazide (PRINZIDE,ZESTORETIC) 10-12.5 MG tablet Take 1 tablet by mouth daily.     Multiple Vitamin (MULTIVITAMIN WITH MINERALS) TABS tablet Take 1 tablet by mouth daily.     Tetrahydrozoline HCl (VISINE OP) Place 1 drop into both eyes daily.     zolpidem (AMBIEN) 10 MG tablet TAKE 1 TABLET BY MOUTH EVERYDAY AT BEDTIME 30 tablet 0    No current facility-administered medications for this visit.   Allergies  Allergen Reactions   Dexamethasone    Other     general anesthesia - pt's family has history of malignant hyperthermia   Oxycodone     Hallucinations    Penicillins     Childhood reaction Has patient had a PCN reaction causing immediate rash, facial/tongue/throat swelling, SOB or lightheadedness with hypotension: Unknown Has patient had a PCN reaction causing severe rash involving  mucus membranes or skin necrosis: Unknown Has patient had a PCN reaction that required hospitalization: Unknown Has patient had a PCN reaction occurring within the last 10 years: No  Tolerated ancef 02/10/2020      Review of Systems: All systems reviewed and negative except where noted in HPI.    Lab Results  Component Value Date   WBC 4.1 05/03/2021   HGB 13.4 05/03/2021   HCT 39.0 05/03/2021   MCV 90.9 05/03/2021   PLT 467.0 (H) 05/03/2021    Lab Results  Component Value Date   CREATININE 0.76 05/03/2021   BUN 20 05/03/2021   NA 131 (L) 05/03/2021   K 4.5 05/03/2021   CL 94 (L) 05/03/2021   CO2 29 05/03/2021    Lab Results  Component Value Date   ALT 24 05/03/2021   AST 24 05/03/2021   ALKPHOS 94 05/03/2021   BILITOT 0.7 05/03/2021    Lab Results  Component Value Date   TSH 1.41 05/03/2021      Physical Exam: BP 124/80   Pulse 88   Ht 5\' 6"  (1.676 m)   Wt 142 lb 6 oz (64.6 kg)   SpO2 97%   BMI 22.98 kg/m  Constitutional: Pleasant,well-developed, female in no acute distress. HEENT: Normocephalic and atraumatic. Conjunctivae are normal. No scleral icterus. Neck supple.  Cardiovascular: Normal rate, regular rhythm.  Pulmonary/chest: Effort normal and breath sounds normal.  Abdominal: Soft, nondistended, nontender.  There are no masses palpable.  Extremities: no edema Lymphadenopathy: No cervical adenopathy noted. Neurological: Alert and oriented to person place and time. Skin: Skin is warm  and dry. No rashes noted. Psychiatric: Normal mood and affect. Behavior is normal.   ASSESSMENT AND PLAN: 80 year old female here establish care for the following:  Chronic diarrhea Bloating Alcohol use  Discussed work-up to date for the patient.  She clearly had lymphocytic colitis throughout her colon on multiple biopsy specimens back in 2019.  She failed to respond to budesonide and cholestyramine at that time however in follow-up flex sig with biopsies showed no evidence of microscopic colitis.  Trial of rifaximin was not beneficial and then she was switched to Viberzi which has controlled her symptoms pretty well.  She does have ongoing bloating that is fairly significant.  She tested negative for celiac disease.  We discussed options.  When questioned about her alcohol use she is drinking a fair amount of alcohol on a daily basis.  I counseled her this can increase her risk for pancreatitis and Viberzi is not recommended with alcohol use more than 2 drinks per day.  She states she is not willing to decrease her alcohol use so in this light I do not recommend she continue with Viberzi therapy.  She is agreeable with this.  We discussed how to manage her symptoms moving forward.  Microscopic colitis remains possible given her prior colonoscopy result.  That being said she is not interested in pursuing a colonoscopy to clarify if this is the etiology or not.  She has not tried Imodium on a routine basis and will try 1 Imodium every morning and titrate up as needed.  Counseled her on a low FODMAP diet to see if this will help her gas and bloating and provided handouts.  She can also use some IBgard as needed to see if that will help her abdomen.  She will follow-up in 3 to 4 months for reassessment, if significant worsening while off Viberzi she should contact me.  I otherwise counseled  her on risks of routine alcohol use at the volume she is consuming, at risk for alcoholic liver disease.   Fortunately her CT scan and labs show no evidence of this at present time.  Plan: - stop Viberzi due to EtOH use - trial of Low FODMAP diet - trial of IB gard to use PRN - start Immodium q AM and titrate up as needed - counseled on risks of alcohol - if no better may need to repeat full colonoscopy but she wishes to hold off on that if possible - minimize NSAID use (she does not use routinely) - f/u 3-4 months  Jolly Mango, MD [facility]  CC: Maximiano Coss, NP

## 2021-06-29 ENCOUNTER — Other Ambulatory Visit: Payer: Self-pay | Admitting: Registered Nurse

## 2021-06-29 DIAGNOSIS — G4700 Insomnia, unspecified: Secondary | ICD-10-CM

## 2021-06-29 NOTE — Telephone Encounter (Signed)
Patient is requesting a refill of the following medications: Requested Prescriptions   Pending Prescriptions Disp Refills   zolpidem (AMBIEN) 10 MG tablet [Pharmacy Med Name: ZOLPIDEM TARTRATE 10 MG TABLET] 30 tablet 0    Sig: TAKE 1 TABLET BY MOUTH EVERYDAY AT BEDTIME    Date of patient request: 06/29/21 Last office visit: 05/03/2021 Date of last refill: 06/03/2021 Last refill amount: 30x0R Follow up time period per chart: 6 months

## 2021-07-02 DIAGNOSIS — S42031D Displaced fracture of lateral end of right clavicle, subsequent encounter for fracture with routine healing: Secondary | ICD-10-CM | POA: Diagnosis not present

## 2021-07-08 DIAGNOSIS — S42031D Displaced fracture of lateral end of right clavicle, subsequent encounter for fracture with routine healing: Secondary | ICD-10-CM | POA: Diagnosis not present

## 2021-07-08 DIAGNOSIS — M25511 Pain in right shoulder: Secondary | ICD-10-CM | POA: Diagnosis not present

## 2021-07-08 DIAGNOSIS — I890 Lymphedema, not elsewhere classified: Secondary | ICD-10-CM | POA: Diagnosis not present

## 2021-07-08 DIAGNOSIS — Z4889 Encounter for other specified surgical aftercare: Secondary | ICD-10-CM | POA: Diagnosis not present

## 2021-07-20 DIAGNOSIS — L900 Lichen sclerosus et atrophicus: Secondary | ICD-10-CM | POA: Diagnosis not present

## 2021-07-20 DIAGNOSIS — L738 Other specified follicular disorders: Secondary | ICD-10-CM | POA: Diagnosis not present

## 2021-07-20 DIAGNOSIS — L309 Dermatitis, unspecified: Secondary | ICD-10-CM | POA: Diagnosis not present

## 2021-07-20 DIAGNOSIS — L570 Actinic keratosis: Secondary | ICD-10-CM | POA: Diagnosis not present

## 2021-07-25 ENCOUNTER — Emergency Department (HOSPITAL_COMMUNITY)
Admission: EM | Admit: 2021-07-25 | Discharge: 2021-07-26 | Disposition: A | Discharge status: Home / Self Care | Payer: BC Managed Care – PPO | Attending: Emergency Medicine | Admitting: Emergency Medicine

## 2021-07-25 ENCOUNTER — Emergency Department (HOSPITAL_COMMUNITY): Payer: BC Managed Care – PPO

## 2021-07-25 ENCOUNTER — Encounter (HOSPITAL_COMMUNITY): Payer: Self-pay

## 2021-07-25 ENCOUNTER — Other Ambulatory Visit: Payer: Self-pay

## 2021-07-25 DIAGNOSIS — S0990XA Unspecified injury of head, initial encounter: Secondary | ICD-10-CM | POA: Insufficient documentation

## 2021-07-25 DIAGNOSIS — Z5321 Procedure and treatment not carried out due to patient leaving prior to being seen by health care provider: Secondary | ICD-10-CM | POA: Diagnosis not present

## 2021-07-25 DIAGNOSIS — G8911 Acute pain due to trauma: Secondary | ICD-10-CM | POA: Insufficient documentation

## 2021-07-25 DIAGNOSIS — R0781 Pleurodynia: Secondary | ICD-10-CM | POA: Diagnosis not present

## 2021-07-25 DIAGNOSIS — W010XXA Fall on same level from slipping, tripping and stumbling without subsequent striking against object, initial encounter: Secondary | ICD-10-CM | POA: Diagnosis not present

## 2021-07-25 DIAGNOSIS — R0789 Other chest pain: Secondary | ICD-10-CM | POA: Diagnosis not present

## 2021-07-25 NOTE — ED Notes (Signed)
Patient left on own accord °

## 2021-07-25 NOTE — ED Provider Triage Note (Signed)
Emergency Medicine Provider Triage Evaluation Note  Emma Swanson , a 81 y.o. female  was evaluated in triage.  Pt complains of a fall last night.  Patient states that she got up and tripped over a 5 pound weight that she uses to keep her [place] doors open.  Patient thinks that she fell backwards and hit her head.  Unclear whether or not she lost consciousness.  Also think she reinjured a previous right clavicular fracture.  Also complaining of left rib pain.  Review of Systems  Positive:  Negative: See above   Physical Exam  BP (!) 174/92 (BP Location: Left Arm)    Pulse 84    Temp 97.9 F (36.6 C) (Oral)    Resp 16    Ht 5\' 6"  (1.676 m)    Wt 61.7 kg    SpO2 100%    BMI 21.95 kg/m  Gen:   Awake, no distress   Resp:  Normal effort  MSK:   Right arm in a sling.  There is positive left lower rib tenderness along the anterior chest wall. Other:    Medical Decision Making  Medically screening exam initiated at 3:36 PM.  Appropriate orders placed.  Guiliana Shor was informed that the remainder of the evaluation will be completed by another provider, this initial triage assessment does not replace that evaluation, and the importance of remaining in the ED until their evaluation is complete.  Work-up initiated.   Myna Bright [place], [place] 07/25/21 1539

## 2021-07-25 NOTE — ED Triage Notes (Signed)
Patient reports she fell two days ago hitting back of head and had LOC.  Also complains of right clavicle pain

## 2021-07-27 ENCOUNTER — Other Ambulatory Visit: Payer: Self-pay | Admitting: Registered Nurse

## 2021-07-27 DIAGNOSIS — G4700 Insomnia, unspecified: Secondary | ICD-10-CM

## 2021-07-27 NOTE — Telephone Encounter (Signed)
Patient is requesting a refill of the following medications: Requested Prescriptions   Pending Prescriptions Disp Refills   zolpidem (AMBIEN) 10 MG tablet [Pharmacy Med Name: ZOLPIDEM TARTRATE 10 MG TABLET] 30 tablet 0    Sig: TAKE 1 TABLET BY MOUTH EVERYDAY AT BEDTIME    Date of patient request: 07/27/21 Last office visit: 05/03/21 Date of last refill: 06/29/21 Last refill amount: 30

## 2021-08-01 DIAGNOSIS — Z4789 Encounter for other orthopedic aftercare: Secondary | ICD-10-CM | POA: Diagnosis not present

## 2021-08-28 ENCOUNTER — Other Ambulatory Visit: Payer: Self-pay | Admitting: Registered Nurse

## 2021-08-28 DIAGNOSIS — G4700 Insomnia, unspecified: Secondary | ICD-10-CM

## 2021-09-07 DIAGNOSIS — L570 Actinic keratosis: Secondary | ICD-10-CM | POA: Diagnosis not present

## 2021-09-07 DIAGNOSIS — D485 Neoplasm of uncertain behavior of skin: Secondary | ICD-10-CM | POA: Diagnosis not present

## 2021-09-12 DIAGNOSIS — S42031D Displaced fracture of lateral end of right clavicle, subsequent encounter for fracture with routine healing: Secondary | ICD-10-CM | POA: Diagnosis not present

## 2021-09-24 ENCOUNTER — Other Ambulatory Visit: Payer: Self-pay | Admitting: Registered Nurse

## 2021-09-24 DIAGNOSIS — S42031D Displaced fracture of lateral end of right clavicle, subsequent encounter for fracture with routine healing: Secondary | ICD-10-CM | POA: Diagnosis not present

## 2021-09-24 DIAGNOSIS — G4700 Insomnia, unspecified: Secondary | ICD-10-CM

## 2021-09-24 NOTE — Telephone Encounter (Signed)
Patient is requesting a refill of the following medications: ?Requested Prescriptions  ? ?Pending Prescriptions Disp Refills  ? zolpidem (AMBIEN) 10 MG tablet [Pharmacy Med Name: ZOLPIDEM TARTRATE 10 MG TABLET] 30 tablet 0  ?  Sig: TAKE 1 TABLET BY MOUTH EVERYDAY AT BEDTIME  ? ? ?Date of patient request: 09/24/2021 ?Last office visit: 05/03/2021 ?Date of last refill: 08/29/2021 ?Last refill amount: 30 tablets  ?Follow up time period per chart: 11/07/2021 ? ?

## 2021-10-01 DIAGNOSIS — S42021G Displaced fracture of shaft of right clavicle, subsequent encounter for fracture with delayed healing: Secondary | ICD-10-CM | POA: Diagnosis not present

## 2021-10-08 DIAGNOSIS — S42021G Displaced fracture of shaft of right clavicle, subsequent encounter for fracture with delayed healing: Secondary | ICD-10-CM | POA: Diagnosis not present

## 2021-10-08 DIAGNOSIS — Z4789 Encounter for other orthopedic aftercare: Secondary | ICD-10-CM | POA: Diagnosis not present

## 2021-10-23 DIAGNOSIS — I890 Lymphedema, not elsewhere classified: Secondary | ICD-10-CM | POA: Diagnosis not present

## 2021-10-23 DIAGNOSIS — S42031D Displaced fracture of lateral end of right clavicle, subsequent encounter for fracture with routine healing: Secondary | ICD-10-CM | POA: Diagnosis not present

## 2021-10-23 DIAGNOSIS — W19XXXA Unspecified fall, initial encounter: Secondary | ICD-10-CM | POA: Diagnosis not present

## 2021-10-23 DIAGNOSIS — X58XXXA Exposure to other specified factors, initial encounter: Secondary | ICD-10-CM | POA: Diagnosis not present

## 2021-10-23 DIAGNOSIS — T8489XA Other specified complication of internal orthopedic prosthetic devices, implants and grafts, initial encounter: Secondary | ICD-10-CM | POA: Diagnosis not present

## 2021-10-23 DIAGNOSIS — G8918 Other acute postprocedural pain: Secondary | ICD-10-CM | POA: Diagnosis not present

## 2021-10-23 DIAGNOSIS — S42031G Displaced fracture of lateral end of right clavicle, subsequent encounter for fracture with delayed healing: Secondary | ICD-10-CM | POA: Diagnosis not present

## 2021-10-23 DIAGNOSIS — M25511 Pain in right shoulder: Secondary | ICD-10-CM | POA: Diagnosis not present

## 2021-10-23 DIAGNOSIS — Z4889 Encounter for other specified surgical aftercare: Secondary | ICD-10-CM | POA: Diagnosis not present

## 2021-10-26 ENCOUNTER — Other Ambulatory Visit: Payer: Self-pay | Admitting: Registered Nurse

## 2021-10-26 DIAGNOSIS — G4700 Insomnia, unspecified: Secondary | ICD-10-CM

## 2021-10-26 NOTE — Telephone Encounter (Signed)
Patient is requesting a refill of the following medications: ?Requested Prescriptions  ? ?Pending Prescriptions Disp Refills  ? zolpidem (AMBIEN) 10 MG tablet [Pharmacy Med Name: ZOLPIDEM TARTRATE 10 MG TABLET] 30 tablet 0  ?  Sig: TAKE 1 TABLET BY MOUTH EVERYDAY AT BEDTIME  ? ? ?Date of patient request: 10/26/21 ?Last office visit: 05/03/22 ?Date of last refill: 09/24/21 ?Last refill amount: 30 ? ?

## 2021-11-01 ENCOUNTER — Encounter: Payer: Self-pay | Admitting: Registered Nurse

## 2021-11-01 ENCOUNTER — Ambulatory Visit (INDEPENDENT_AMBULATORY_CARE_PROVIDER_SITE_OTHER): Payer: BC Managed Care – PPO | Admitting: Registered Nurse

## 2021-11-01 VITALS — BP 112/75 | HR 85 | Temp 98.2°F | Resp 18 | Ht 66.0 in | Wt 145.4 lb

## 2021-11-01 DIAGNOSIS — R040 Epistaxis: Secondary | ICD-10-CM | POA: Diagnosis not present

## 2021-11-01 DIAGNOSIS — I10 Essential (primary) hypertension: Secondary | ICD-10-CM

## 2021-11-01 DIAGNOSIS — W19XXXD Unspecified fall, subsequent encounter: Secondary | ICD-10-CM

## 2021-11-01 DIAGNOSIS — G4700 Insomnia, unspecified: Secondary | ICD-10-CM

## 2021-11-01 DIAGNOSIS — K760 Fatty (change of) liver, not elsewhere classified: Secondary | ICD-10-CM | POA: Insufficient documentation

## 2021-11-01 MED ORDER — OXYMETAZOLINE HCL 0.05 % NA SOLN: 1.0000 | Freq: Two times a day (BID) | NASAL | 0 refills | Status: DC

## 2021-11-01 NOTE — Progress Notes (Signed)
Established Patient Office Visit  Subjective:  Patient ID: Emma Swanson, female    DOB: 1941-02-06  Age: 81 y.o. MRN: [id number]  CC:  Chief Complaint  Patient presents with   Follow-up    Patient states she is here for a 6 month follow up. Patient states she would like to discuss a very bad nose bleed this morning with occasional headaches after a fall    HPI Emma Swanson presents for 6 mo follow up   Hypertension: Patient Currently taking: lisinopril-hctz 10-12.'5mg'$  po qd Good effect. No AEs. Denies CV symptoms including: chest pain, shob, doe, headache, visual changes, fatigue, claudication, and dependent edema.   Previous readings and labs: BP Readings from Last 3 Encounters:  11/16/21 102/70  11/01/21 112/75  07/25/21 (!) 130/94   Lab Results  Component Value Date   CREATININE 0.76 05/03/2021   Insomnia Zolpidem '10mg'$  po qhs prn. Good effect, no AE Hopes to continue  Fall Originally in [place]  After christmas, mechanical fall and broke clavicle again. Did have a surgery to repair Plans to continue following with ortho No acute concerns.   Epistaxis Not related to fall Sudden start, able to stop with pressure and tissues.  Outpatient Medications Prior to Visit  Medication Sig Dispense Refill   lisinopril-hydrochlorothiazide (PRINZIDE,ZESTORETIC) 10-12.5 MG tablet Take 1 tablet by mouth daily.     loperamide (IMODIUM A-D) 2 MG tablet Take 1 tablet (2 mg total) by mouth in the morning. Increase to twice a day if needed 30 tablet 0   Multiple Vitamin (MULTIVITAMIN WITH MINERALS) TABS tablet Take 1 tablet by mouth daily.     Peppermint Oil (IBGARD) 90 MG CPCR prn     Tetrahydrozoline HCl (VISINE OP) Place 1 drop into both eyes daily.     HYDROcodone-acetaminophen (NORCO/VICODIN) 5-325 MG tablet hydrocodone 5 mg-acetaminophen 325 mg tablet  1-2 po q 6 hrs prn severe pain     ipratropium (ATROVENT) 0.06 % nasal spray Place 2-3 sprays into both nostrils  daily.      zolpidem (AMBIEN) 10 MG tablet TAKE 1 TABLET BY MOUTH EVERYDAY AT BEDTIME 30 tablet 0   No facility-administered medications prior to visit.    Review of Systems  Constitutional: Negative.   HENT: Negative.    Eyes: Negative.   Respiratory: Negative.    Cardiovascular: Negative.   Gastrointestinal: Negative.   Genitourinary: Negative.   Musculoskeletal: Negative.   Skin: Negative.   Neurological: Negative.   Psychiatric/Behavioral: Negative.    All other systems reviewed and are negative.    Objective:     BP 112/75   Pulse 85   Temp 98.2 F (36.8 C) (Temporal)   Resp 18   Ht '5\' 6"'$  (1.676 m)   Wt 145 lb 6.4 oz (66 kg)   SpO2 96%   BMI 23.47 kg/m   Wt Readings from Last 3 Encounters:  11/16/21 145 lb 5 oz (65.9 kg)  11/01/21 145 lb 6.4 oz (66 kg)  07/25/21 136 lb (61.7 kg)   Physical Exam Vitals and nursing note reviewed.  Constitutional:      General: She is not in acute distress.    Appearance: Normal appearance. She is normal weight. She is not ill-appearing, toxic-appearing or diaphoretic.  Cardiovascular:     Rate and Rhythm: Normal rate and regular rhythm.     Heart sounds: Normal heart sounds. No murmur heard.   No friction rub. No gallop.  Pulmonary:     Effort: Pulmonary effort  is normal. No respiratory distress.     Breath sounds: Normal breath sounds. No stridor. No wheezing, rhonchi or rales.  Chest:     Chest wall: No tenderness.  Skin:    General: Skin is warm and dry.  Neurological:     General: No focal deficit present.     Mental Status: She is alert and oriented to person, place, and time. Mental status is at baseline.  Psychiatric:        Mood and Affect: Mood normal.        Behavior: Behavior normal.        Thought Content: Thought content normal.        Judgment: Judgment normal.    No results found for any visits on 11/01/21.    The 10-year ASCVD risk score (Arnett DK, et al., 2019) is: 35.6%    Assessment &  Plan:   Problem List Items Addressed This Visit   None Visit Diagnoses     Epistaxis    -  Primary   Relevant Medications   oxymetazoline (AFRIN) 0.05 % nasal spray   Fall, subsequent encounter       Relevant Orders   CT HEAD WO CONTRAST (5MM) (Completed)       Meds ordered this encounter  Medications   oxymetazoline (AFRIN) 0.05 % nasal spray    Sig: Place 1 spray into both nostrils 2 (two) times daily.    Dispense:  30 mL    Refill:  0    Order Specific Question:   Supervising Provider    Answer:   Carlota Raspberry, JEFFREY R [[id number]]    Return in about 6 months (around 05/03/2022) for Chronic Conditions.    Maximiano Coss, NP

## 2021-11-01 NOTE — Patient Instructions (Addendum)
Ms. Dolder - ? ?Great to see you! ? ?Refills x 6 mo ? ?See you then. ? ?Thanks, ? ?Rich  ? ? ? ?If you have lab work done today you will be contacted with your lab results within the next 2 weeks.  If you have not heard from [name] then please contact us. The fastest way to get your results is to register for My Chart. ? ? ?IF you received an x-ray today, you will receive an invoice from [facility]. Please contact [facility] at [phone] with questions or concerns regarding your invoice.  ? ?IF you received labwork today, you will receive an invoice from [facility]. Please contact [facility] at [phone] with questions or concerns regarding your invoice.  ? ?Our billing staff will not be able to assist you with questions regarding bills from these companies. ? ?You will be contacted with the lab results as soon as they are available. The fastest way to get your results is to activate your My Chart account. Instructions are located on the last page of this paperwork. If you have not heard from [name] regarding the results in 2 weeks, please contact this office. ?  ? ? ?

## 2021-11-02 DIAGNOSIS — S42021G Displaced fracture of shaft of right clavicle, subsequent encounter for fracture with delayed healing: Secondary | ICD-10-CM | POA: Diagnosis not present

## 2021-11-05 ENCOUNTER — Other Ambulatory Visit: Payer: Self-pay | Admitting: Registered Nurse

## 2021-11-05 DIAGNOSIS — Z1231 Encounter for screening mammogram for malignant neoplasm of breast: Secondary | ICD-10-CM

## 2021-11-07 ENCOUNTER — Other Ambulatory Visit: Payer: Self-pay

## 2021-11-07 DIAGNOSIS — M25511 Pain in right shoulder: Secondary | ICD-10-CM | POA: Diagnosis not present

## 2021-11-07 DIAGNOSIS — S42031D Displaced fracture of lateral end of right clavicle, subsequent encounter for fracture with routine healing: Secondary | ICD-10-CM | POA: Diagnosis not present

## 2021-11-07 DIAGNOSIS — Z4889 Encounter for other specified surgical aftercare: Secondary | ICD-10-CM | POA: Diagnosis not present

## 2021-11-07 DIAGNOSIS — I890 Lymphedema, not elsewhere classified: Secondary | ICD-10-CM | POA: Diagnosis not present

## 2021-11-07 DIAGNOSIS — Z23 Encounter for immunization: Secondary | ICD-10-CM | POA: Diagnosis not present

## 2021-11-08 HISTORY — PX: RESECTION DISTAL CLAVICAL: SHX5053

## 2021-11-10 ENCOUNTER — Other Ambulatory Visit: Payer: Self-pay | Admitting: Registered Nurse

## 2021-11-10 DIAGNOSIS — R040 Epistaxis: Secondary | ICD-10-CM

## 2021-11-10 DIAGNOSIS — W19XXXD Unspecified fall, subsequent encounter: Secondary | ICD-10-CM

## 2021-11-10 DIAGNOSIS — S0990XD Unspecified injury of head, subsequent encounter: Secondary | ICD-10-CM

## 2021-11-14 ENCOUNTER — Ambulatory Visit
Admission: RE | Admit: 2021-11-14 | Discharge: 2021-11-14 | Disposition: A | Discharge status: Home / Self Care | Payer: BC Managed Care – PPO | Source: Ambulatory Visit | Attending: Registered Nurse | Admitting: Registered Nurse

## 2021-11-14 DIAGNOSIS — Z1231 Encounter for screening mammogram for malignant neoplasm of breast: Secondary | ICD-10-CM

## 2021-11-16 ENCOUNTER — Ambulatory Visit (INDEPENDENT_AMBULATORY_CARE_PROVIDER_SITE_OTHER): Payer: BC Managed Care – PPO | Admitting: Gastroenterology

## 2021-11-16 ENCOUNTER — Encounter: Payer: Self-pay | Admitting: Gastroenterology

## 2021-11-16 VITALS — BP 102/70 | HR 104 | Ht 66.0 in | Wt 145.3 lb

## 2021-11-16 DIAGNOSIS — R140 Abdominal distension (gaseous): Secondary | ICD-10-CM | POA: Diagnosis not present

## 2021-11-16 DIAGNOSIS — R194 Change in bowel habit: Secondary | ICD-10-CM | POA: Diagnosis not present

## 2021-11-16 NOTE — Patient Instructions (Signed)
Please purchase the following medications over the counter and take as directed: ?Citrucel- 1 heaping teaspoon dissolved in at least 8 ounces water/juice once daily ? ?Continue Imodium daily. ? ?Hold off on the IBGuard for now. ? ?Please avoid all NSAID's. Some examples of NSAID's are as follows: ?Aspirin (Bufferin, Bayer, and Excedrin) ?Ibuprofen (Advil, Motrin, Nuprin) ?Ketoprofen (Actron, Orudis) ?Naproxen (Aleve) ?Daypro  ?Indocin  ?Lodine  ?Naprosyn  ?Relafen  ?Vimovo ?Voltaren ? ?You may use Tylenol as directed for pain relief. ? ?Please follow up with Dr Havery Moros as needed. ? ?If you are age 35 or older, your body mass index should be between 23-30. Your Body mass index is 23.45 kg/m?Marland Kitchen If this is out of the aforementioned range listed, please consider follow up with your Primary Care Provider. ? ?If you are age 90 or younger, your body mass index should be between 19-25. Your Body mass index is 23.45 kg/m?Marland Kitchen If this is out of the aformentioned range listed, please consider follow up with your Primary Care Provider.  ? ?________________________________________________________ ? ?The [facility] providers would like to encourage you to use [facility] to communicate with providers for non-urgent requests or questions.  Due to long hold times on the telephone, sending your provider a message by [facility] may be a faster and more efficient way to get a response.  Please allow 48 business hours for a response.  Please remember that this is for non-urgent requests.  ?_______________________________________________________ ?Due to recent changes in healthcare laws, you may see the results of your imaging and laboratory studies on MyChart before your provider has had a chance to review them.  We understand that in some cases there may be results that are confusing or concerning to you. Not all laboratory results come back in the same time frame and the provider may be waiting for multiple results in order to interpret  others.  Please give [name] 48 hours in order for your provider to thoroughly review all the results before contacting the office for clarification of your results.  ?e ?

## 2021-11-16 NOTE — Progress Notes (Signed)
? ?HPI :  ?81 year old female here for a follow-up visit for altered bowel habits, history of lymphocytic colitis.  See prior clinic visit from December 2022 for full details of her history. ? ?Recall she had a colonoscopy with [facility] in 2019 which diffusely showed lymphocytic colitis. She was treated with a course of budesonide and they added cholestyramine to this for persistent symptoms, sounds like it did not help too much.  She endorsed being on NSAIDs in the past although unclear if she was on these at the time of that exam.  Given persistence of symptoms she had a flex sig in March 2019 which showed no evidence of lymphocytic colitis.  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  Per the notes in her old chart she failed a trial of rifaximin.  In May 2020 she was started on Viberzi which definitely slowed down her bowels.  When she saw me in December she was on the regimen but endorsed routine alcohol use anywhere from 2 glasses of wine per night to a bottle of wine.  In this light we discontinued her Viberzi as she was not wishing to reduce her alcohol intake.  She is never had a history of pancreatitis. ? ?At her last visit we discussed options.  Viberzi was stopped, recommended a low FODMAP diet, recommended Imodium daily and titrate up as needed, recommended IBgard as needed.  Recommended she avoid routine NSAID use. ? ?She has had issues with a clavicle fracture and now underwent her third surgery for this recently.  She has been recovering from that, is using ibuprofen on a daily basis, as well as Tylenol as needed.  She reports that her bowels have been better since have last seen her.  Her frequency has decreased.  She is having a bowel movement perhaps every other day to daily.  Her stools are now loose but are soft.  She does find it hard to clean herself however due to stool form when she does go.  No blood in her stools.  She is not sure if the IBgard is really helping her too much but she  has been taking it.  She has tried the low FODMAP diet and is trying to avoid food triggers.  Generally she is feeling bit better than when I last saw her.  Stool form seems to be her main complaint at this point time.  We discussed how aggressive she wanted to be in regards to her symptoms and management moving forward. ? ?Prior work-up: ?Flex sig 09/23/18 - no evidence of lymphocytic colitis on path ?  ?Celiac labs TTG IgA and IgG negative, total IgA normal ?  ?Colonoscopy 10/06/2017 - 14m rectal adenoma, otherwise biopsies diffusely showed lymphocytic colitis ?  ?CT abdomen / pelvis 06/25/20 - IMPRESSION: ?1. Subtle inflammatory changes are suggested adjacent to the ?decompressed left colon, which would support a mild inflammatory or ?infectious colitis in the proper clinical setting. This portion of ?the bowel is not well assessed due to lack of distension. ?2. No other evidence of an acute abnormality within the abdomen or ?pelvis. ?3. Aortic atherosclerosis ?  ?Past Medical History:  ?Diagnosis Date  ? Anxiety   ? Arthritis   ? Asthma   ? Cancer ([facility]   ? hx of skin cancers   ? Colon polyps   ? Family history of adverse reaction to anesthesia   ? see malignant hyperthermia tab   ? GERD (gastroesophageal reflux disease)   ? Hypertension   ?  Hypothyroidism   ? Malignant hyperthermia   ? cousin's son- - malignant hyperthermia al most died  ,  patient's brother and his 2 sons tested positive for malignant hyperthermia  ? Palpitations   ? PONV (postoperative nausea and vomiting)   ? Vitamin D deficiency   ? ? ? ?Past Surgical History:  ?Procedure Laterality Date  ? AUGMENTATION MAMMAPLASTY Bilateral   ? bilateral arthroscopic knee surgery     ? bone spur removal     ? BREAST BIOPSY    ? CLAVICLE SURGERY Right   ? COLONOSCOPY    ? left ankle surgery - had pins     ? RESECTION DISTAL CLAVICAL Right 11/08/2021  ? thyroid surgery - age 18     ? TOTAL KNEE ARTHROPLASTY Right 11/25/2017  ? Procedure: RIGHT TOTAL KNEE  ARTHROPLASTY;  Surgeon: Paralee Cancel, MD;  Location: [facility];  Service: Orthopedics;  Laterality: Right;  70 mins  ? TOTAL KNEE ARTHROPLASTY Left 04/16/2018  ? Procedure: LEFT TOTAL KNEE ARTHROPLASTY;  Surgeon: Paralee Cancel, MD;  Location: [facility];  Service: Orthopedics;  Laterality: Left;  90 mins  ? TOTAL SHOULDER ARTHROPLASTY Left 02/10/2020  ? Procedure: TOTAL SHOULDER ARTHROPLASTY;  Surgeon: Justice Britain, MD;  Location: [facility];  Service: Orthopedics;  Laterality: Left;  177mn  ? ?Family History  ?Problem Relation Age of Onset  ? Cancer Mother   ? Hypertension Brother   ? Diabetes Brother   ? Heart disease Brother   ? Malignant hyperthermia Brother   ? Pancreatic cancer Paternal Aunt   ? Cancer Other   ? Pancreatic cancer Other   ? Malignant hyperthermia Other   ? Colon cancer Neg Hx   ? Esophageal cancer Neg Hx   ? Stomach cancer Neg Hx   ? ?Social History  ? ?Tobacco Use  ? Smoking status: Former  ?  Types: Cigarettes  ? Smokeless tobacco: Never  ? Tobacco comments:  ?  age of 89until age 89  ?Vaping Use  ? Vaping Use: Never used  ?Substance Use Topics  ? Alcohol use: Yes  ?  Comment: 3 glasses of wine a night  ? Drug use: Never  ? ?Current Outpatient Medications  ?Medication Sig Dispense Refill  ? lisinopril-hydrochlorothiazide (PRINZIDE,ZESTORETIC) 10-12.5 MG tablet Take 1 tablet by mouth daily.    ? loperamide (IMODIUM A-D) 2 MG tablet Take 1 tablet (2 mg total) by mouth in the morning. Increase to twice a day if needed 30 tablet 0  ? Multiple Vitamin (MULTIVITAMIN WITH MINERALS) TABS tablet Take 1 tablet by mouth daily.    ? oxymetazoline (AFRIN) 0.05 % nasal spray Place 1 spray into both nostrils 2 (two) times daily. 30 mL 0  ? Oxymetazoline HCl (NASAL SPRAY NA) Place into the nose. Daily for allergy    ? Peppermint Oil (IBGARD) 90 MG CPCR prn    ? Tetrahydrozoline HCl (VISINE OP) Place 1 drop into both eyes daily.    ? zolpidem (AMBIEN) 10 MG tablet TAKE 1 TABLET BY MOUTH EVERYDAY AT BEDTIME 30  tablet 0  ? ?No current facility-administered medications for this visit.  ? ?Allergies  ?Allergen Reactions  ? Dexamethasone   ? Other   ?  general anesthesia - pt's family has history of malignant hyperthermia  ? Oxycodone   ?  Hallucinations   ? Penicillins   ?  Childhood reaction ?Has patient had a PCN reaction causing immediate rash, facial/tongue/throat swelling, SOB or lightheadedness with hypotension: Unknown ?Has  patient had a PCN reaction causing severe rash involving mucus membranes or skin necrosis: Unknown ?Has patient had a PCN reaction that required hospitalization: Unknown ?Has patient had a PCN reaction occurring within the last 10 years: No ? ?Tolerated ancef 02/10/2020 ?  ? ? ? ?Review of Systems: ?All systems reviewed and negative except where noted in HPI.  ? ?Lab Results  ?Component Value Date  ? WBC 4.1 05/03/2021  ? HGB 13.4 05/03/2021  ? HCT 39.0 05/03/2021  ? MCV 90.9 05/03/2021  ? PLT 467.0 (H) 05/03/2021  ? ? ?Lab Results  ?Component Value Date  ? CREATININE 0.76 05/03/2021  ? BUN 20 05/03/2021  ? NA 131 (L) 05/03/2021  ? K 4.5 05/03/2021  ? CL 94 (L) 05/03/2021  ? CO2 29 05/03/2021  ? ? ?Lab Results  ?Component Value Date  ? ALT 24 05/03/2021  ? AST 24 05/03/2021  ? ALKPHOS 94 05/03/2021  ? BILITOT 0.7 05/03/2021  ? ? ? ?Physical Exam: ?BP 102/70   Pulse (!) 104   Ht '5\' 6"'$  (1.676 m)   Wt 145 lb 5 oz (65.9 kg)   BMI 23.45 kg/m?  ?Constitutional: Pleasant,well-developed, female in no acute distress. ?Neurological: Alert and oriented to person place and time. ?Psychiatric: Normal mood and affect. Behavior is normal. ? ? ?ASSESSMENT AND PLAN: ?81 y/o female here for reassessment of the following: ? ?Altered bowel habits ?Bloating ? ?See above for prior workup and course. Since last visit taking immodium daily and for the most part stool frequency is improved and form is better but still "soft" and messy to clean. Discussed options. Trying to avoid any food triggers and she will continue  to do so. She has failed multiple regimens as above or not noted as much benefit. Stool form seems to be main concern right now - will try adding Citrucel daily to see if that will bulk stools but also avoid causing a

## 2021-11-20 DIAGNOSIS — Z23 Encounter for immunization: Secondary | ICD-10-CM | POA: Diagnosis not present

## 2021-11-22 DIAGNOSIS — I890 Lymphedema, not elsewhere classified: Secondary | ICD-10-CM | POA: Diagnosis not present

## 2021-11-22 DIAGNOSIS — H35363 Drusen (degenerative) of macula, bilateral: Secondary | ICD-10-CM | POA: Diagnosis not present

## 2021-11-22 DIAGNOSIS — S42031D Displaced fracture of lateral end of right clavicle, subsequent encounter for fracture with routine healing: Secondary | ICD-10-CM | POA: Diagnosis not present

## 2021-11-22 DIAGNOSIS — H25043 Posterior subcapsular polar age-related cataract, bilateral: Secondary | ICD-10-CM | POA: Diagnosis not present

## 2021-11-22 DIAGNOSIS — H2513 Age-related nuclear cataract, bilateral: Secondary | ICD-10-CM | POA: Diagnosis not present

## 2021-11-22 DIAGNOSIS — H524 Presbyopia: Secondary | ICD-10-CM | POA: Diagnosis not present

## 2021-11-22 DIAGNOSIS — H25013 Cortical age-related cataract, bilateral: Secondary | ICD-10-CM | POA: Diagnosis not present

## 2021-11-22 DIAGNOSIS — H35371 Puckering of macula, right eye: Secondary | ICD-10-CM | POA: Diagnosis not present

## 2021-11-22 DIAGNOSIS — M25511 Pain in right shoulder: Secondary | ICD-10-CM | POA: Diagnosis not present

## 2021-11-22 DIAGNOSIS — H35033 Hypertensive retinopathy, bilateral: Secondary | ICD-10-CM | POA: Diagnosis not present

## 2021-11-22 DIAGNOSIS — H35013 Changes in retinal vascular appearance, bilateral: Secondary | ICD-10-CM | POA: Diagnosis not present

## 2021-11-22 DIAGNOSIS — Z4889 Encounter for other specified surgical aftercare: Secondary | ICD-10-CM | POA: Diagnosis not present

## 2021-11-26 ENCOUNTER — Other Ambulatory Visit: Payer: Self-pay | Admitting: Registered Nurse

## 2021-11-26 DIAGNOSIS — G4700 Insomnia, unspecified: Secondary | ICD-10-CM

## 2021-11-26 DIAGNOSIS — S42021G Displaced fracture of shaft of right clavicle, subsequent encounter for fracture with delayed healing: Secondary | ICD-10-CM | POA: Diagnosis not present

## 2021-11-27 ENCOUNTER — Ambulatory Visit
Admission: RE | Admit: 2021-11-27 | Discharge: 2021-11-27 | Disposition: A | Discharge status: Home / Self Care | Payer: BC Managed Care – PPO | Source: Ambulatory Visit | Attending: Registered Nurse | Admitting: Registered Nurse

## 2021-11-27 DIAGNOSIS — D2371 Other benign neoplasm of skin of right lower limb, including hip: Secondary | ICD-10-CM | POA: Diagnosis not present

## 2021-11-27 DIAGNOSIS — W19XXXD Unspecified fall, subsequent encounter: Secondary | ICD-10-CM

## 2021-11-27 DIAGNOSIS — D485 Neoplasm of uncertain behavior of skin: Secondary | ICD-10-CM | POA: Diagnosis not present

## 2021-11-27 DIAGNOSIS — Z0389 Encounter for observation for other suspected diseases and conditions ruled out: Secondary | ICD-10-CM | POA: Diagnosis not present

## 2021-12-05 DIAGNOSIS — Z01419 Encounter for gynecological examination (general) (routine) without abnormal findings: Secondary | ICD-10-CM | POA: Diagnosis not present

## 2021-12-05 DIAGNOSIS — Z6823 Body mass index (BMI) 23.0-23.9, adult: Secondary | ICD-10-CM | POA: Diagnosis not present

## 2021-12-10 DIAGNOSIS — G4700 Insomnia, unspecified: Secondary | ICD-10-CM | POA: Insufficient documentation

## 2021-12-10 DIAGNOSIS — I10 Essential (primary) hypertension: Secondary | ICD-10-CM | POA: Insufficient documentation

## 2021-12-10 DIAGNOSIS — W19XXXA Unspecified fall, initial encounter: Secondary | ICD-10-CM | POA: Insufficient documentation

## 2021-12-10 DIAGNOSIS — S42021G Displaced fracture of shaft of right clavicle, subsequent encounter for fracture with delayed healing: Secondary | ICD-10-CM | POA: Diagnosis not present

## 2021-12-10 DIAGNOSIS — R040 Epistaxis: Secondary | ICD-10-CM | POA: Insufficient documentation

## 2021-12-10 NOTE — Assessment & Plan Note (Signed)
Stable on zolpidem. Reviewed risks, benefits, and side effects, pt voices understanding.

## 2021-12-10 NOTE — Assessment & Plan Note (Signed)
Stable.  Continue current medications.

## 2021-12-10 NOTE — Assessment & Plan Note (Signed)
Continue to follow with ortho

## 2021-12-10 NOTE — Assessment & Plan Note (Signed)
Afrin given.

## 2021-12-18 DIAGNOSIS — S42021G Displaced fracture of shaft of right clavicle, subsequent encounter for fracture with delayed healing: Secondary | ICD-10-CM | POA: Diagnosis not present

## 2021-12-20 DIAGNOSIS — S42021G Displaced fracture of shaft of right clavicle, subsequent encounter for fracture with delayed healing: Secondary | ICD-10-CM | POA: Diagnosis not present

## 2021-12-24 ENCOUNTER — Other Ambulatory Visit: Payer: Self-pay | Admitting: Registered Nurse

## 2021-12-24 DIAGNOSIS — G4700 Insomnia, unspecified: Secondary | ICD-10-CM

## 2021-12-25 DIAGNOSIS — S42021G Displaced fracture of shaft of right clavicle, subsequent encounter for fracture with delayed healing: Secondary | ICD-10-CM | POA: Diagnosis not present

## 2021-12-28 DIAGNOSIS — S42021G Displaced fracture of shaft of right clavicle, subsequent encounter for fracture with delayed healing: Secondary | ICD-10-CM | POA: Diagnosis not present

## 2021-12-31 DIAGNOSIS — S42021G Displaced fracture of shaft of right clavicle, subsequent encounter for fracture with delayed healing: Secondary | ICD-10-CM | POA: Diagnosis not present

## 2022-01-02 DIAGNOSIS — Z4789 Encounter for other orthopedic aftercare: Secondary | ICD-10-CM | POA: Diagnosis not present

## 2022-01-03 DIAGNOSIS — S42021G Displaced fracture of shaft of right clavicle, subsequent encounter for fracture with delayed healing: Secondary | ICD-10-CM | POA: Diagnosis not present

## 2022-01-15 DIAGNOSIS — L900 Lichen sclerosus et atrophicus: Secondary | ICD-10-CM | POA: Diagnosis not present

## 2022-01-16 DIAGNOSIS — S42021G Displaced fracture of shaft of right clavicle, subsequent encounter for fracture with delayed healing: Secondary | ICD-10-CM | POA: Diagnosis not present

## 2022-01-18 DIAGNOSIS — S42021G Displaced fracture of shaft of right clavicle, subsequent encounter for fracture with delayed healing: Secondary | ICD-10-CM | POA: Diagnosis not present

## 2022-01-21 DIAGNOSIS — M25511 Pain in right shoulder: Secondary | ICD-10-CM | POA: Diagnosis not present

## 2022-01-25 DIAGNOSIS — M25511 Pain in right shoulder: Secondary | ICD-10-CM | POA: Diagnosis not present

## 2022-01-29 ENCOUNTER — Ambulatory Visit (INDEPENDENT_AMBULATORY_CARE_PROVIDER_SITE_OTHER): Payer: BC Managed Care – PPO | Admitting: Registered Nurse

## 2022-01-29 ENCOUNTER — Encounter: Payer: Self-pay | Admitting: Registered Nurse

## 2022-01-29 ENCOUNTER — Other Ambulatory Visit: Payer: Self-pay

## 2022-01-29 VITALS — BP 120/78 | HR 98 | Temp 98.0°F | Resp 18 | Ht 66.0 in | Wt 145.0 lb

## 2022-01-29 DIAGNOSIS — G4700 Insomnia, unspecified: Secondary | ICD-10-CM

## 2022-01-29 DIAGNOSIS — L299 Pruritus, unspecified: Secondary | ICD-10-CM | POA: Diagnosis not present

## 2022-01-29 DIAGNOSIS — R21 Rash and other nonspecific skin eruption: Secondary | ICD-10-CM | POA: Diagnosis not present

## 2022-01-29 MED ORDER — ZOLPIDEM TARTRATE 10 MG PO TABS: ORAL_TABLET | ORAL | 5 refills | Status: DC

## 2022-01-29 MED ORDER — TRIAMCINOLONE ACETONIDE 0.1 % EX CREA: 1.0000 | TOPICAL_CREAM | Freq: Two times a day (BID) | CUTANEOUS | 0 refills | Status: DC

## 2022-01-29 MED ORDER — HYDROXYZINE HCL 10 MG PO TABS: 10.0000 mg | ORAL_TABLET | Freq: Three times a day (TID) | ORAL | 0 refills | Status: DC | PRN

## 2022-01-29 MED ORDER — PERMETHRIN 5 % EX CREA: 1.0000 | TOPICAL_CREAM | Freq: Once | CUTANEOUS | 0 refills | Status: AC

## 2022-01-29 NOTE — Progress Notes (Signed)
Acute Office Visit  Subjective:    Patient ID: Emma Swanson, female    DOB: Jul 19, 1941, 81 y.o.   MRN: [id number]  Chief Complaint  Patient presents with   Insect Bite    Patient states she is here for a possible bug bites all over her body she thinks she has mites.    HPI Patient is in today for bug bites  Across entire body. A lot of itching. Had a pest control expert to her home, no sign of pests Has upcoming steam cleaning.   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  Mostly itching in axillae and groin Red raised bumps No drainage  Insomnia Needs refill on zolpidem Per pdmp, last fill on 12/25/21 for #30 no refills Tolerating well, no AE.  Outpatient Medications Prior to Visit  Medication Sig Dispense Refill   lisinopril-hydrochlorothiazide (PRINZIDE,ZESTORETIC) 10-12.5 MG tablet Take 1 tablet by mouth daily.     loperamide (IMODIUM A-D) 2 MG tablet Take 1 tablet (2 mg total) by mouth in the morning. Increase to twice a day if needed 30 tablet 0   Multiple Vitamin (MULTIVITAMIN WITH MINERALS) TABS tablet Take 1 tablet by mouth daily.     Oxymetazoline HCl (NASAL SPRAY NA) Place into the nose. Daily for allergy     Peppermint Oil (IBGARD) 90 MG CPCR prn     Tetrahydrozoline HCl (VISINE OP) Place 1 drop into both eyes daily.     zolpidem (AMBIEN) 10 MG tablet TAKE 1 TABLET BY MOUTH EVERYDAY AT BEDTIME 30 tablet 0   oxymetazoline (AFRIN) 0.05 % nasal spray Place 1 spray into both nostrils 2 (two) times daily. 30 mL 0   No facility-administered medications prior to visit.    Review of Systems  Constitutional: Negative.   HENT: Negative.    Eyes: Negative.   Respiratory: Negative.    Cardiovascular: Negative.   Gastrointestinal: Negative.   Genitourinary: Negative.   Musculoskeletal: Negative.   Skin:  Positive for rash. Negative for color change, pallor and wound.  Neurological: Negative.    Psychiatric/Behavioral: Negative.    All other systems reviewed and are negative.      Objective:    BP 120/78   Pulse 98   Temp 98 F (36.7 C) (Temporal)   Resp 18   Ht '5\' 6"'$  (1.676 m)   Wt 145 lb (65.8 kg)   SpO2 100%   BMI 23.40 kg/m  Physical Exam Vitals and nursing note reviewed.  Constitutional:      General: She is not in acute distress.    Appearance: Normal appearance. She is normal weight. She is not ill-appearing, toxic-appearing or diaphoretic.  Cardiovascular:     Rate and Rhythm: Normal rate and regular rhythm.     Heart sounds: Normal heart sounds. No murmur heard.    No friction rub. No gallop.  Pulmonary:     Effort: Pulmonary effort is normal. No respiratory distress.     Breath sounds: Normal breath sounds. No stridor. No wheezing, rhonchi or rales.  Chest:     Chest wall: No tenderness.  Skin:    General: Skin is warm and dry.     Findings: Rash (red scattered bumps) present.  Neurological:     General: No focal deficit present.     Mental Status: She is alert and oriented to person, place, and time. Mental status is at baseline.  Psychiatric:  Mood and Affect: Mood normal.        Behavior: Behavior normal.        Thought Content: Thought content normal.        Judgment: Judgment normal.     No results found for any visits on 01/29/22.      Assessment & Plan:  There are no diagnoses linked to this encounter.   No orders of the defined types were placed in this encounter.   No follow-ups on file.  PLAN Rash questionable for mites. Will send permetherin and itch relief with hydroxyzine and triamcinolone. Refill zolpidem Advised to follow up with new PCP in 6 mo Patient encouraged to call clinic with any questions, comments, or concerns.   Maximiano Coss, NP

## 2022-01-29 NOTE — Patient Instructions (Addendum)
Ms. Emma Swanson to see you!  Refills on ambien queued up.  I have sent permetherin cream. Use this neck to toes overnight.   I have sent hydroxyzine and triamcinolone to help with itching  If worsening, let me know  I recommend: Inda Coke, PA Dimas Chyle, MD Myrna Blazer Early, NP Jeralyn Ruths, DNP  Thanks,  Rich     If you have lab work done today you will be contacted with your lab results within the next 2 weeks.  If you have not heard from [name] then please contact us. The fastest way to get your results is to register for My Chart.   IF you received an x-ray today, you will receive an invoice from [facility]. Please contact [facility] at [phone] with questions or concerns regarding your invoice.   IF you received labwork today, you will receive an invoice from [place]. Please contact [facility] at [phone] with questions or concerns regarding your invoice.   Our billing staff will not be able to assist you with questions regarding bills from these companies.  You will be contacted with the lab results as soon as they are available. The fastest way to get your results is to activate your My Chart account. Instructions are located on the last page of this paperwork. If you have not heard from [name] regarding the results in 2 weeks, please contact this office.

## 2022-01-31 DIAGNOSIS — M25511 Pain in right shoulder: Secondary | ICD-10-CM | POA: Diagnosis not present

## 2022-01-31 DIAGNOSIS — S42021G Displaced fracture of shaft of right clavicle, subsequent encounter for fracture with delayed healing: Secondary | ICD-10-CM | POA: Diagnosis not present

## 2022-02-04 DIAGNOSIS — M25511 Pain in right shoulder: Secondary | ICD-10-CM | POA: Diagnosis not present

## 2022-02-07 DIAGNOSIS — S42021G Displaced fracture of shaft of right clavicle, subsequent encounter for fracture with delayed healing: Secondary | ICD-10-CM | POA: Diagnosis not present

## 2022-02-11 ENCOUNTER — Encounter (INDEPENDENT_AMBULATORY_CARE_PROVIDER_SITE_OTHER): Payer: BC Managed Care – PPO | Admitting: Ophthalmology

## 2022-02-11 DIAGNOSIS — I10 Essential (primary) hypertension: Secondary | ICD-10-CM

## 2022-02-11 DIAGNOSIS — H43813 Vitreous degeneration, bilateral: Secondary | ICD-10-CM | POA: Diagnosis not present

## 2022-02-11 DIAGNOSIS — H35033 Hypertensive retinopathy, bilateral: Secondary | ICD-10-CM

## 2022-02-11 DIAGNOSIS — H35373 Puckering of macula, bilateral: Secondary | ICD-10-CM

## 2022-02-13 DIAGNOSIS — S42021G Displaced fracture of shaft of right clavicle, subsequent encounter for fracture with delayed healing: Secondary | ICD-10-CM | POA: Diagnosis not present

## 2022-02-14 DIAGNOSIS — M25511 Pain in right shoulder: Secondary | ICD-10-CM | POA: Diagnosis not present

## 2022-02-15 ENCOUNTER — Ambulatory Visit (INDEPENDENT_AMBULATORY_CARE_PROVIDER_SITE_OTHER): Payer: BC Managed Care – PPO | Admitting: Physician Assistant

## 2022-02-15 ENCOUNTER — Encounter: Payer: Self-pay | Admitting: Family

## 2022-02-15 VITALS — BP 110/76 | HR 83 | Temp 97.6°F | Ht 66.0 in | Wt 142.0 lb

## 2022-02-15 DIAGNOSIS — K219 Gastro-esophageal reflux disease without esophagitis: Secondary | ICD-10-CM | POA: Diagnosis not present

## 2022-02-15 DIAGNOSIS — R1110 Vomiting, unspecified: Secondary | ICD-10-CM

## 2022-02-15 MED ORDER — FAMOTIDINE 20 MG PO TABS: 20.0000 mg | ORAL_TABLET | Freq: Two times a day (BID) | ORAL | 0 refills | Status: DC

## 2022-02-15 NOTE — Progress Notes (Signed)
Subjective:    Patient ID: Emma Swanson, female    DOB: 1941/07/01, 81 y.o.   MRN: [id number]  Chief Complaint  Patient presents with   Emesis    Pt c/o vomiting for a week, Pt states it happened this morning twice. Pt states she started taking turmeric and not sure if that is what's causing it. Has not tried anything for it. States she does not feel sick, she just randomly vomits.     Emesis    Patient is in today for intermittent vomiting in the mornings x 4 days. Vomited after taking her vitamins this morning, then again after cereal.  Not much odor to vomiting. No blood in the vomit.  No trouble swallowing. Some loss of appetite.   Has a hx of IBS-mixed. Taking Imodium and IB guard. Sees Dr. Havery Moros. No hx abdominal surgeries.   Just started OTC Tumeric vitamin in the last few weeks for some ongoing joint pain.   Some hx of GERD in the past. No hx of endoscopy. UTD on colonoscopies.   Last meal of the day between 5-7 pm. Doesn't smoke. Not much caffeine, no coffee.  Denies any nausea. No abdominal pain. No dizziness. No headaches. No chest pain or SOB. No unintentional weight loss. No decrease in activity level. No recent sickness. No postnasal drip.   Past Medical History:  Diagnosis Date   Anxiety    Arthritis    Asthma    Cancer ([place])    hx of skin cancers    Colon polyps    Family history of adverse reaction to anesthesia    see malignant hyperthermia tab    GERD (gastroesophageal reflux disease)    Hypertension    Hypothyroidism    Malignant hyperthermia    cousin's son- - malignant hyperthermia al most died  ,  patient's brother and his 2 sons tested positive for malignant hyperthermia   Palpitations    PONV (postoperative nausea and vomiting)    Vitamin D deficiency     Past Surgical History:  Procedure Laterality Date   AUGMENTATION MAMMAPLASTY Bilateral    bilateral arthroscopic knee surgery      bone spur removal      BREAST BIOPSY      CLAVICLE SURGERY Right    COLONOSCOPY     left ankle surgery - had pins      RESECTION DISTAL CLAVICAL Right 11/08/2021   thyroid surgery - age 39      TOTAL KNEE ARTHROPLASTY Right 11/25/2017   Procedure: RIGHT TOTAL KNEE ARTHROPLASTY;  Surgeon: Paralee Cancel, MD;  Location: [facility];  Service: Orthopedics;  Laterality: Right;  70 mins   TOTAL KNEE ARTHROPLASTY Left 04/16/2018   Procedure: LEFT TOTAL KNEE ARTHROPLASTY;  Surgeon: Paralee Cancel, MD;  Location: [facility];  Service: Orthopedics;  Laterality: Left;  90 mins   TOTAL SHOULDER ARTHROPLASTY Left 02/10/2020   Procedure: TOTAL SHOULDER ARTHROPLASTY;  Surgeon: Justice Britain, MD;  Location: [facility];  Service: Orthopedics;  Laterality: Left;  139mn    Family History  Problem Relation Age of Onset   Cancer Mother    Hypertension Brother    Diabetes Brother    Heart disease Brother    Malignant hyperthermia Brother    Pancreatic cancer Paternal Aunt    Cancer Other    Pancreatic cancer Other    Malignant hyperthermia Other    Colon cancer Neg Hx    Esophageal cancer Neg Hx    Stomach cancer Neg Hx  Social History   Tobacco Use   Smoking status: Former    Types: Cigarettes   Smokeless tobacco: Never   Tobacco comments:    age of 78 until age 49   Vaping Use   Vaping Use: Never used  Substance Use Topics   Alcohol use: Yes    Comment: 3 glasses of wine a night   Drug use: Never     Allergies  Allergen Reactions   Dexamethasone    Other     general anesthesia - pt's family has history of malignant hyperthermia   Oxycodone     Hallucinations    Penicillins     Childhood reaction Has patient had a PCN reaction causing immediate rash, facial/tongue/throat swelling, SOB or lightheadedness with hypotension: Unknown Has patient had a PCN reaction causing severe rash involving mucus membranes or skin necrosis: Unknown Has patient had a PCN reaction that required hospitalization: Unknown Has patient had a PCN reaction  occurring within the last 10 years: No  Tolerated ancef 02/10/2020     Review of Systems  Gastrointestinal:  Positive for vomiting.   NEGATIVE UNLESS OTHERWISE INDICATED IN HPI      Objective:     BP 110/76 (BP Location: Left Arm, Patient Position: Sitting, Cuff Size: Large)   Pulse 83   Temp 97.6 F (36.4 C) (Temporal)   Ht '5\' 6"'$  (1.676 m)   Wt 142 lb (64.4 kg)   SpO2 97%   BMI 22.92 kg/m   Wt Readings from Last 3 Encounters:  02/15/22 142 lb (64.4 kg)  01/29/22 145 lb (65.8 kg)  11/16/21 145 lb 5 oz (65.9 kg)    BP Readings from Last 3 Encounters:  02/15/22 110/76  01/29/22 120/78  11/16/21 102/70     Physical Exam Vitals and nursing note reviewed.  Constitutional:      Appearance: Normal appearance.  HENT:     Nose: Nose normal. No congestion or rhinorrhea.     Mouth/Throat:     Mouth: Mucous membranes are moist.     Pharynx: Oropharynx is clear. No oropharyngeal exudate or posterior oropharyngeal erythema.  Eyes:     Extraocular Movements: Extraocular movements intact.     Conjunctiva/sclera: Conjunctivae normal.     Pupils: Pupils are equal, round, and reactive to light.  Cardiovascular:     Rate and Rhythm: Normal rate and regular rhythm.     Pulses: Normal pulses.     Heart sounds: Normal heart sounds. No murmur heard. Pulmonary:     Effort: Pulmonary effort is normal.     Breath sounds: Normal breath sounds. No wheezing.  Abdominal:     General: Abdomen is flat. Bowel sounds are normal. There is no distension.     Palpations: Abdomen is soft. There is no mass.     Tenderness: There is no abdominal tenderness. There is no right CVA tenderness, left CVA tenderness, guarding or rebound.     Hernia: No hernia is present.  Skin:    General: Skin is warm.     Findings: No rash.  Neurological:     General: No focal deficit present.     Mental Status: She is alert and oriented to person, place, and time.  Psychiatric:        Mood and Affect: Mood  normal.        Behavior: Behavior normal.        Assessment & Plan:   Problem List Items Addressed This Visit   None Visit Diagnoses  Intermittent vomiting    -  Primary   Gastroesophageal reflux disease without esophagitis       Relevant Medications   famotidine (PEPCID) 20 MG tablet        Meds ordered this encounter  Medications   famotidine (PEPCID) 20 MG tablet    Sig: Take 1 tablet (20 mg total) by mouth 2 (two) times daily for 14 days.    Dispense:  28 tablet    Refill:  0    Order Specific Question:   Supervising Provider    Answer:   Yong Channel, STEPHEN O [[id number]]   1. Intermittent vomiting 2. Gastroesophageal reflux disease without esophagitis -No red flag symptoms, overall non-toxic on exam  -? Possible flare up of GERD symptoms -Try famotidine twice daily as directed. BRAT diet, limit caffeine. Don't eat late at night. Consider stopping Tumeric supplement to see if that was a contributing factor.  ED if any blood in vomit, severe vomiting, abdominal pain, or other acute concerns arise.  Pt agreeable and understanding.    Return if symptoms worsen or fail to improve.   Toluwani Ruder M Avya Flavell, PA-C

## 2022-02-15 NOTE — Patient Instructions (Addendum)
?  Acid reflux flaring up - try famotidine twice daily as directed. BRAT diet, limit caffeine. Don't eat late at night. Consider stopping Tumeric supplement to see if that was a contributing factor.  ED if any blood in vomit, severe vomiting, abdominal pain, or other acute concerns arise.

## 2022-02-19 DIAGNOSIS — M25511 Pain in right shoulder: Secondary | ICD-10-CM | POA: Diagnosis not present

## 2022-02-21 DIAGNOSIS — M25511 Pain in right shoulder: Secondary | ICD-10-CM | POA: Diagnosis not present

## 2022-02-27 DIAGNOSIS — M25511 Pain in right shoulder: Secondary | ICD-10-CM | POA: Diagnosis not present

## 2022-03-01 DIAGNOSIS — S42021G Displaced fracture of shaft of right clavicle, subsequent encounter for fracture with delayed healing: Secondary | ICD-10-CM | POA: Diagnosis not present

## 2022-03-04 ENCOUNTER — Encounter: Payer: Self-pay | Admitting: Physician Assistant

## 2022-03-04 ENCOUNTER — Ambulatory Visit (INDEPENDENT_AMBULATORY_CARE_PROVIDER_SITE_OTHER): Payer: BC Managed Care – PPO | Admitting: Physician Assistant

## 2022-03-04 VITALS — BP 106/68 | HR 76 | Temp 98.0°F | Ht 66.0 in | Wt 141.4 lb

## 2022-03-04 DIAGNOSIS — I10 Essential (primary) hypertension: Secondary | ICD-10-CM | POA: Diagnosis not present

## 2022-03-04 DIAGNOSIS — M25511 Pain in right shoulder: Secondary | ICD-10-CM | POA: Diagnosis not present

## 2022-03-04 DIAGNOSIS — G4700 Insomnia, unspecified: Secondary | ICD-10-CM | POA: Diagnosis not present

## 2022-03-04 NOTE — Patient Instructions (Addendum)
It was great to see you!  Let's follow-up after Oct 13 for your yearly physical.  Take care,  Inda Coke PA-C

## 2022-03-04 NOTE — Progress Notes (Signed)
Emma Swanson is a 81 y.o. female here for transfer of care from her PCP who is leaving his practice.  History of Present Illness:   Chief Complaint  Patient presents with   Transitions Of Care    HPI  HTN Currently taking lisinopril-hctz 10-25 mg. At home blood pressure readings are: not checked. Patient denies chest pain, SOB, blurred vision, dizziness, unusual headaches, lower leg swelling. Patient is compliant with medication. Denies excessive caffeine intake, stimulant usage, excessive alcohol intake, or increase in salt consumption.  BP Readings from Last 3 Encounters:  03/04/22 106/68  02/15/22 110/76  01/29/22 120/78    Insomnia Has been taking ambien 10 mg for several years. She feels as though she is dependent on it. She has tried a few times to not take it but then she cannot sleep at all.  Past Medical History:  Diagnosis Date   Anxiety    Arthritis    Asthma    Cancer ([place])    hx of skin cancers    Colon polyps    Family history of adverse reaction to anesthesia    see malignant hyperthermia tab    GERD (gastroesophageal reflux disease)    Hypertension    Hypothyroidism    Malignant hyperthermia    cousin's son- - malignant hyperthermia al most died  ,  patient's brother and his 2 sons tested positive for malignant hyperthermia   Palpitations    PONV (postoperative nausea and vomiting)    Vitamin D deficiency      Social History   Tobacco Use   Smoking status: Former    Types: Cigarettes   Smokeless tobacco: Never   Tobacco comments:    age of 6 until age 3   Vaping Use   Vaping Use: Never used  Substance Use Topics   Alcohol use: Yes    Comment: 3 glasses of wine a night   Drug use: Never    Past Surgical History:  Procedure Laterality Date   AUGMENTATION MAMMAPLASTY Bilateral    bilateral arthroscopic knee surgery      bone spur removal      BREAST BIOPSY     CLAVICLE SURGERY Right    COLONOSCOPY     left ankle surgery - had pins       RESECTION DISTAL CLAVICAL Right 11/08/2021   thyroid surgery - age 48      TOTAL KNEE ARTHROPLASTY Right 11/25/2017   Procedure: RIGHT TOTAL KNEE ARTHROPLASTY;  Surgeon: Paralee Cancel, MD;  Location: [facility];  Service: Orthopedics;  Laterality: Right;  70 mins   TOTAL KNEE ARTHROPLASTY Left 04/16/2018   Procedure: LEFT TOTAL KNEE ARTHROPLASTY;  Surgeon: Paralee Cancel, MD;  Location: [facility];  Service: Orthopedics;  Laterality: Left;  90 mins   TOTAL SHOULDER ARTHROPLASTY Left 02/10/2020   Procedure: TOTAL SHOULDER ARTHROPLASTY;  Surgeon: Justice Britain, MD;  Location: [facility];  Service: Orthopedics;  Laterality: Left;  164mn    Family History  Problem Relation Age of Onset   Cancer Mother    Hypertension Brother    Diabetes Brother    Heart disease Brother    Malignant hyperthermia Brother    Pancreatic cancer Paternal Aunt    Cancer Other    Pancreatic cancer Other    Malignant hyperthermia Other    Colon cancer Neg Hx    Esophageal cancer Neg Hx    Stomach cancer Neg Hx     Allergies  Allergen Reactions   Dexamethasone  Other     general anesthesia - pt's family has history of malignant hyperthermia   Oxycodone     Hallucinations    Penicillins     Childhood reaction Has patient had a PCN reaction causing immediate rash, facial/tongue/throat swelling, SOB or lightheadedness with hypotension: Unknown Has patient had a PCN reaction causing severe rash involving mucus membranes or skin necrosis: Unknown Has patient had a PCN reaction that required hospitalization: Unknown Has patient had a PCN reaction occurring within the last 10 years: No  Tolerated ancef 02/10/2020     Current Medications:   Current Outpatient Medications:    clobetasol ointment (TEMOVATE) 0.05 %, Apply topically 2 (two) times daily., Disp: , Rfl:    lisinopril-hydrochlorothiazide (PRINZIDE,ZESTORETIC) 10-12.5 MG tablet, Take 1 tablet by mouth daily., Disp: , Rfl:    loperamide (IMODIUM A-D) 2 MG  tablet, Take 1 tablet (2 mg total) by mouth in the morning. Increase to twice a day if needed, Disp: 30 tablet, Rfl: 0   Multiple Vitamin (MULTIVITAMIN WITH MINERALS) TABS tablet, Take 1 tablet by mouth daily., Disp: , Rfl:    Oxymetazoline HCl (NASAL SPRAY NA), Place into the nose. Daily for allergy, Disp: , Rfl:    Peppermint Oil (IBGARD) 90 MG CPCR, prn, Disp: , Rfl:    Tetrahydrozoline HCl (VISINE OP), Place 1 drop into both eyes daily., Disp: , Rfl:    triamcinolone cream (KENALOG) 0.1 %, Apply 1 Application topically 2 (two) times daily., Disp: 30 g, Rfl: 0   zolpidem (AMBIEN) 10 MG tablet, TAKE 1 TABLET BY MOUTH EVERYDAY AT BEDTIME, Disp: 30 tablet, Rfl: 5   Review of Systems:   ROS Negative unless otherwise specified per HPI.   Vitals:   Vitals:   03/04/22 1102  BP: 106/68  Pulse: 76  Temp: 98 F (36.7 C)  SpO2: 96%  Weight: 141 lb 6.1 oz (64.1 kg)  Height: '5\' 6"'$  (1.676 m)     Body mass index is 22.82 kg/m.  Physical Exam:   Physical Exam Vitals and nursing note reviewed.  Constitutional:      General: She is not in acute distress.    Appearance: She is well-developed. She is not ill-appearing or toxic-appearing.  Cardiovascular:     Rate and Rhythm: Normal rate and regular rhythm.     Pulses: Normal pulses.     Heart sounds: Normal heart sounds, S1 normal and S2 normal.  Pulmonary:     Effort: Pulmonary effort is normal.     Breath sounds: Normal breath sounds.  Skin:    General: Skin is warm and dry.  Neurological:     Mental Status: She is alert.     GCS: GCS eye subscore is 4. GCS verbal subscore is 5. GCS motor subscore is 6.  Psychiatric:        Speech: Speech normal.        Behavior: Behavior normal. Behavior is cooperative.     Assessment and Plan:   Primary hypertension Normotensive Continue lisinopril-hctz 10-25 mg daily Follow-up in 3 months for CPE -- sooner if concerns  Insomnia, unspecified type Well controlled Continue ambien 10 mg  daily Follow-up in 3 months for CPE -- sooner if concerns    Inda Coke, PA-C

## 2022-03-06 DIAGNOSIS — M25511 Pain in right shoulder: Secondary | ICD-10-CM | POA: Diagnosis not present

## 2022-03-07 DIAGNOSIS — L821 Other seborrheic keratosis: Secondary | ICD-10-CM | POA: Diagnosis not present

## 2022-03-07 DIAGNOSIS — L089 Local infection of the skin and subcutaneous tissue, unspecified: Secondary | ICD-10-CM | POA: Diagnosis not present

## 2022-03-07 DIAGNOSIS — L738 Other specified follicular disorders: Secondary | ICD-10-CM | POA: Diagnosis not present

## 2022-03-07 DIAGNOSIS — D485 Neoplasm of uncertain behavior of skin: Secondary | ICD-10-CM | POA: Diagnosis not present

## 2022-03-07 DIAGNOSIS — L720 Epidermal cyst: Secondary | ICD-10-CM | POA: Diagnosis not present

## 2022-03-07 DIAGNOSIS — L570 Actinic keratosis: Secondary | ICD-10-CM | POA: Diagnosis not present

## 2022-03-07 DIAGNOSIS — D3612 Benign neoplasm of peripheral nerves and autonomic nervous system, upper limb, including shoulder: Secondary | ICD-10-CM | POA: Diagnosis not present

## 2022-03-07 DIAGNOSIS — L820 Inflamed seborrheic keratosis: Secondary | ICD-10-CM | POA: Diagnosis not present

## 2022-03-12 DIAGNOSIS — M25511 Pain in right shoulder: Secondary | ICD-10-CM | POA: Diagnosis not present

## 2022-03-14 DIAGNOSIS — M25511 Pain in right shoulder: Secondary | ICD-10-CM | POA: Diagnosis not present

## 2022-03-18 DIAGNOSIS — L900 Lichen sclerosus et atrophicus: Secondary | ICD-10-CM | POA: Diagnosis not present

## 2022-03-18 DIAGNOSIS — Z6822 Body mass index (BMI) 22.0-22.9, adult: Secondary | ICD-10-CM | POA: Diagnosis not present

## 2022-03-19 DIAGNOSIS — S42021G Displaced fracture of shaft of right clavicle, subsequent encounter for fracture with delayed healing: Secondary | ICD-10-CM | POA: Diagnosis not present

## 2022-03-21 DIAGNOSIS — M25511 Pain in right shoulder: Secondary | ICD-10-CM | POA: Diagnosis not present

## 2022-03-27 DIAGNOSIS — S42021G Displaced fracture of shaft of right clavicle, subsequent encounter for fracture with delayed healing: Secondary | ICD-10-CM | POA: Diagnosis not present

## 2022-04-15 ENCOUNTER — Encounter: Payer: Self-pay | Admitting: *Deleted

## 2022-04-30 ENCOUNTER — Encounter: Payer: Self-pay | Admitting: Family Medicine

## 2022-04-30 DIAGNOSIS — M9901 Segmental and somatic dysfunction of cervical region: Secondary | ICD-10-CM | POA: Diagnosis not present

## 2022-04-30 DIAGNOSIS — M9902 Segmental and somatic dysfunction of thoracic region: Secondary | ICD-10-CM | POA: Diagnosis not present

## 2022-04-30 DIAGNOSIS — M62830 Muscle spasm of back: Secondary | ICD-10-CM | POA: Diagnosis not present

## 2022-04-30 DIAGNOSIS — M4312 Spondylolisthesis, cervical region: Secondary | ICD-10-CM | POA: Diagnosis not present

## 2022-05-07 ENCOUNTER — Encounter: Payer: Self-pay | Admitting: Physician Assistant

## 2022-05-07 ENCOUNTER — Ambulatory Visit (INDEPENDENT_AMBULATORY_CARE_PROVIDER_SITE_OTHER): Payer: BC Managed Care – PPO | Admitting: Physician Assistant

## 2022-05-07 VITALS — BP 126/80 | HR 87 | Temp 97.8°F | Ht 66.0 in | Wt 139.0 lb

## 2022-05-07 DIAGNOSIS — M9902 Segmental and somatic dysfunction of thoracic region: Secondary | ICD-10-CM | POA: Diagnosis not present

## 2022-05-07 DIAGNOSIS — E88819 Insulin resistance, unspecified: Secondary | ICD-10-CM

## 2022-05-07 DIAGNOSIS — Z0000 Encounter for general adult medical examination without abnormal findings: Secondary | ICD-10-CM | POA: Diagnosis not present

## 2022-05-07 DIAGNOSIS — G4700 Insomnia, unspecified: Secondary | ICD-10-CM | POA: Diagnosis not present

## 2022-05-07 DIAGNOSIS — M62830 Muscle spasm of back: Secondary | ICD-10-CM | POA: Diagnosis not present

## 2022-05-07 DIAGNOSIS — M4312 Spondylolisthesis, cervical region: Secondary | ICD-10-CM | POA: Diagnosis not present

## 2022-05-07 DIAGNOSIS — E785 Hyperlipidemia, unspecified: Secondary | ICD-10-CM

## 2022-05-07 DIAGNOSIS — M9901 Segmental and somatic dysfunction of cervical region: Secondary | ICD-10-CM | POA: Diagnosis not present

## 2022-05-07 DIAGNOSIS — I10 Essential (primary) hypertension: Secondary | ICD-10-CM

## 2022-05-07 DIAGNOSIS — E2839 Other primary ovarian failure: Secondary | ICD-10-CM

## 2022-05-07 LAB — COMPREHENSIVE METABOLIC PANEL
ALT: 28 U/L (ref 0–35)
AST: 25 U/L (ref 0–37)
Albumin: 4.5 g/dL (ref 3.5–5.2)
Alkaline Phosphatase: 71 U/L (ref 39–117)
BUN: 21 mg/dL (ref 6–23)
CO2: 29 mEq/L (ref 19–32)
Calcium: 9.9 mg/dL (ref 8.4–10.5)
Chloride: 97 mEq/L (ref 96–112)
Creatinine, Ser: 0.87 mg/dL (ref 0.40–1.20)
GFR: 62.98 mL/min (ref >60.00)
Glucose, Bld: 93 mg/dL (ref 70–99)
Potassium: 3.5 mEq/L (ref 3.5–5.1)
Sodium: 136 mEq/L (ref 135–145)
Total Bilirubin: 0.8 mg/dL (ref 0.2–1.2)
Total Protein: 7.3 g/dL (ref 6.0–8.3)

## 2022-05-07 LAB — CBC WITH DIFFERENTIAL/PLATELET
Basophils Absolute: 0.1 10*3/uL (ref 0.0–0.1)
Basophils Relative: 1 % (ref 0.0–3.0)
Eosinophils Absolute: 0.1 10*3/uL (ref 0.0–0.7)
Eosinophils Relative: 2.7 % (ref 0.0–5.0)
HCT: 43.4 % (ref 36.0–46.0)
Hemoglobin: 14.9 g/dL (ref 12.0–15.0)
Lymphocytes Relative: 37.3 % (ref 12.0–46.0)
Lymphs Abs: 1.9 10*3/uL (ref 0.7–4.0)
MCHC: 34.3 g/dL (ref 30.0–36.0)
MCV: 96.1 fl (ref 78.0–100.0)
Monocytes Absolute: 0.6 10*3/uL (ref 0.1–1.0)
Monocytes Relative: 11.1 % (ref 3.0–12.0)
Neutro Abs: 2.4 10*3/uL (ref 1.4–7.7)
Neutrophils Relative %: 47.9 % (ref 43.0–77.0)
Platelets: 307 10*3/uL (ref 150.0–400.0)
RBC: 4.51 Mil/uL (ref 3.87–5.11)
RDW: 12.9 % (ref 11.5–15.5)
WBC: 5.1 10*3/uL (ref 4.0–10.5)

## 2022-05-07 LAB — LIPID PANEL
Cholesterol: 192 mg/dL (ref 0–200)
HDL: 84.1 mg/dL (ref >39.00)
LDL Cholesterol: 80 mg/dL (ref 0–99)
NonHDL: 107.87
Total CHOL/HDL Ratio: 2
Triglycerides: 138 mg/dL (ref 0.0–149.0)
VLDL: 27.6 mg/dL (ref 0.0–40.0)

## 2022-05-07 LAB — TSH: TSH: 0.24 u[IU]/mL — ABNORMAL LOW (ref 0.35–5.50)

## 2022-05-07 LAB — HEMOGLOBIN A1C: Hgb A1c MFr Bld: 5.6 % (ref 4.6–6.5)

## 2022-05-07 NOTE — Progress Notes (Signed)
Subjective:    Emma Swanson is a 81 y.o. female and is here for a comprehensive physical exam.  HPI   Health Maintenance Due  Topic Date Due   DEXA SCAN  Never done    Acute Concerns: None  Chronic Issues: IBS She reports she is still having IBS symptoms.   HTN Currently taking lisinopril-hctz 10-12.5 mg. At home blood pressure readings are: not checked. Patient denies chest pain, SOB, blurred vision, dizziness, unusual headaches, lower leg swelling. Patient is compliant with medication. Denies excessive caffeine intake, stimulant usage, excessive alcohol intake, or increase in salt consumption.  BP Readings from Last 3 Encounters:  05/07/22 126/80  03/04/22 106/68  02/15/22 110/76   HLD Currently not on medication.   Insulin resistance Hx of slightly elevated A1c in the past.  Health Maintenance: Social history-- She is going back work. She denies any changes in family history. She occassionally drinks 1 to 3 glasses of wine.  Immunizations -- She is interested in receiving the influenza vaccine during today's visit.   Mammogram -- Last completed on 11/14/2021. Bone Density -- She reports she has had bone density in 2014 and states that it was normal. She is interested in repeating this. Diet -- She is maintaining fairly healthy diet.   Exercise -- She is not regularly exercising, but is trying to exercise more.   Sleep habits -- no major concerns, takes ambien 10 mg regularly Mood -- She notes her mood is stable and she is happy to start working again.   UTD with dentist? - She is UTD with dentist.  UTD with eye doctor? - She is UTD with eye doctor.  Weight history: Wt Readings from Last 10 Encounters:  05/07/22 139 lb (63 kg)  03/04/22 141 lb 6.1 oz (64.1 kg)  02/15/22 142 lb (64.4 kg)  01/29/22 145 lb (65.8 kg)  11/16/21 145 lb 5 oz (65.9 kg)  11/01/21 145 lb 6.4 oz (66 kg)  07/25/21 136 lb (61.7 kg)  06/28/21 142 lb 6 oz (64.6 kg)  05/03/21 138  lb (62.6 kg)  01/29/21 137 lb 4 oz (62.3 kg)   Body mass index is 22.44 kg/m. No LMP recorded. Patient is postmenopausal.  Alcohol use:  reports current alcohol use.  Tobacco use:  Tobacco Use: Medium Risk (05/07/2022)   Patient History    Smoking Tobacco Use: Former    Smokeless Tobacco Use: Never    Passive Exposure: Not on file   Eligible for lung cancer screening? No     05/07/2022   11:01 AM  Depression screen PHQ 2/9  Decreased Interest 0  Down, Depressed, Hopeless 0  PHQ - 2 Score 0     Other providers/specialists: Patient Care Team: Inda Coke, [place] as PCP - General (Physician Assistant)    PMHx, SurgHx, SocialHx, Medications, and Allergies were reviewed in the Visit Navigator and updated as appropriate.   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   Colon polyps    Family history of adverse reaction to anesthesia    see malignant hyperthermia tab    GERD (gastroesophageal reflux disease)    Hypertension    Hypothyroidism    Malignant hyperthermia    cousin's son- - malignant hyperthermia al most died  ,  patient's brother and his 2 sons tested positive for malignant hyperthermia   Palpitations    PONV (postoperative nausea and vomiting)  Vitamin D deficiency      Past Surgical History:  Procedure Laterality Date   AUGMENTATION MAMMAPLASTY Bilateral    bilateral arthroscopic knee surgery      bone spur removal      BREAST BIOPSY     CLAVICLE SURGERY Right    COLONOSCOPY     left ankle surgery - had pins      RESECTION DISTAL CLAVICAL Right 11/08/2021   thyroid surgery - age 20      TOTAL KNEE ARTHROPLASTY Right 11/25/2017   Procedure: RIGHT TOTAL KNEE ARTHROPLASTY;  Surgeon: Paralee Cancel, MD;  [facility];  Service: Orthopedics;  Laterality: Right;  70 mins   TOTAL KNEE ARTHROPLASTY Left 04/16/2018   Procedure: LEFT TOTAL KNEE ARTHROPLASTY;  Surgeon: Paralee Cancel, MD;  [facility];  Service: Orthopedics;  Laterality: Left;  90 mins   TOTAL SHOULDER ARTHROPLASTY Left 02/10/2020   Procedure: TOTAL SHOULDER ARTHROPLASTY;  Surgeon: Justice Britain, MD;  [facility];  Service: Orthopedics;  Laterality: Left;  192mn     Family History  Problem Relation Age of Onset   Cancer Mother    Stroke Father    Hypertension Brother    Diabetes Brother    Heart disease Brother    Malignant hyperthermia Brother    Pancreatic cancer Paternal Aunt    Cancer Other    Pancreatic cancer Other    Malignant hyperthermia Other    Colon cancer Neg Hx    Esophageal cancer Neg Hx    Stomach cancer Neg Hx     Social History   Tobacco Use   Smoking status: Former    Types: Cigarettes   Smokeless tobacco: Never   Tobacco comments:    age of 17until age 81  Vaping Use   Vaping Use: Never used  Substance Use Topics   Alcohol use: Yes    Comment: 1-3 glasses of wine per night   Drug use: Never    Review of Systems:   Review of Systems  Constitutional:  Negative for chills, fever, malaise/fatigue and weight loss.  HENT:  Negative for hearing loss, sinus pain and sore throat.   Respiratory:  Negative for cough and hemoptysis.   Cardiovascular:  Negative for chest pain, palpitations, orthopnea, leg swelling and PND.  Gastrointestinal:  Negative for abdominal pain, constipation, diarrhea, heartburn, nausea and vomiting.  Genitourinary:  Negative for dysuria, frequency and urgency.  Musculoskeletal:  Negative for back pain, myalgias and neck pain.  Skin:  Negative for itching and rash.  Endo/Heme/Allergies:  Negative for polydipsia.  Psychiatric/Behavioral:  Negative for depression. The patient is not nervous/anxious.     Objective:   BP 126/80 (BP Location: Left Arm, Patient Position: Sitting, Cuff Size: Normal)   Pulse 87   Temp 97.8 F (36.6 C) (Temporal)   Ht '5\' 6"'$  (1.676 m)   Wt 139 lb (63 kg)   SpO2 95%   BMI 22.44 kg/m  Body mass index is 22.44  kg/m.   General Appearance:    Alert, cooperative, no distress, appears stated age  Head:    Normocephalic, without obvious abnormality, atraumatic  Eyes:    PERRL, conjunctiva/corneas clear, EOM's intact, fundi    benign, both eyes  Ears:    Normal TM's and external ear canals, both ears  Nose:   Nares normal, septum midline, mucosa normal, no drainage    or sinus tenderness  Throat:   Lips, mucosa, and tongue normal; teeth and gums normal  Neck:   Supple, symmetrical, trachea midline, no adenopathy;    thyroid:  no enlargement/tenderness/nodules; no carotid   bruit or JVD  Back:     Symmetric, no curvature, ROM normal, no CVA tenderness  Lungs:     Clear to auscultation bilaterally, respirations unlabored  Chest Wall:    No tenderness or deformity   Heart:    Regular rate and rhythm, S1 and S2 normal, no murmur, rub or gallop  Breast Exam:    Deferred  Abdomen:     Soft, non-tender, bowel sounds active all four quadrants,    no masses, no organomegaly  Genitalia:    Deferred  Extremities:   Extremities normal, atraumatic, no cyanosis or edema  Pulses:   2+ and symmetric all extremities  Skin:   Skin color, texture, turgor normal, no rashes or lesions  Lymph nodes:   Cervical, supraclavicular, and axillary nodes normal  Neurologic:   CNII-XII intact, normal strength, sensation and reflexes    throughout    Assessment/Plan:   Routine physical examination Today patient counseled on age appropriate routine health concerns for screening and prevention, each reviewed and up to date or declined. Immunizations reviewed and up to date or declined. Labs ordered and reviewed. Risk factors for depression reviewed and negative. Hearing function and visual acuity are intact. ADLs screened and addressed as needed. Functional ability and level of safety reviewed and appropriate. Education, counseling and referrals performed based on assessed risks today. Patient provided with a copy of  personalized plan for preventive services.  Primary hypertension Normotensive Continue lisinopril-hctz 10-12.5 mg Follow-up in 6 months, sooner if concerns  Insomnia, unspecified type Controlled PDMP reviewed No red flags on discussion/exam Continue Ambien 10 mg daily She is aware of side effects, risks of medication Denies falls Follow-up in 6 months, sooner if concerns  Hyperlipidemia, unspecified hyperlipidemia type Update lipid panel today and make recommendations   Insulin resistance Update A1c and make recommendations  Estrogen deficiency Update DEXA  I,Param Shah,acting as a scribe for [employer], PA.,have documented all relevant documentation on the behalf of Inda Coke, PA,as directed by  Inda Coke, PA while in the presence of Inda Coke, [place].  I, Inda Coke, [place], have reviewed all documentation for this visit. The documentation on 05/07/22 for the exam, diagnosis, procedures, and orders are all accurate and complete.  Inda Coke, PA-C [place]

## 2022-05-07 NOTE — Patient Instructions (Signed)
It was great to see you!  Please schedule a bone density test with our front desk for [facility] location.  Please go to the lab for blood work.   Our office will call you with your results unless you have chosen to receive results via MyChart.  If your blood work is normal we will follow-up each year for physicals and as scheduled for chronic medical problems.  If anything is abnormal we will treat accordingly and get you in for a follow-up.  Take care,  Aldona Bar

## 2022-05-08 ENCOUNTER — Other Ambulatory Visit: Payer: Self-pay | Admitting: Physician Assistant

## 2022-05-08 DIAGNOSIS — R7989 Other specified abnormal findings of blood chemistry: Secondary | ICD-10-CM

## 2022-05-08 DIAGNOSIS — R197 Diarrhea, unspecified: Secondary | ICD-10-CM

## 2022-05-13 DIAGNOSIS — H52213 Irregular astigmatism, bilateral: Secondary | ICD-10-CM | POA: Diagnosis not present

## 2022-05-13 DIAGNOSIS — H25043 Posterior subcapsular polar age-related cataract, bilateral: Secondary | ICD-10-CM | POA: Diagnosis not present

## 2022-05-13 DIAGNOSIS — H25013 Cortical age-related cataract, bilateral: Secondary | ICD-10-CM | POA: Diagnosis not present

## 2022-05-13 DIAGNOSIS — H2511 Age-related nuclear cataract, right eye: Secondary | ICD-10-CM | POA: Diagnosis not present

## 2022-05-13 DIAGNOSIS — H2513 Age-related nuclear cataract, bilateral: Secondary | ICD-10-CM | POA: Diagnosis not present

## 2022-05-13 DIAGNOSIS — H35363 Drusen (degenerative) of macula, bilateral: Secondary | ICD-10-CM | POA: Diagnosis not present

## 2022-05-13 DIAGNOSIS — H35371 Puckering of macula, right eye: Secondary | ICD-10-CM | POA: Diagnosis not present

## 2022-05-15 DIAGNOSIS — M9901 Segmental and somatic dysfunction of cervical region: Secondary | ICD-10-CM | POA: Diagnosis not present

## 2022-05-15 DIAGNOSIS — M9902 Segmental and somatic dysfunction of thoracic region: Secondary | ICD-10-CM | POA: Diagnosis not present

## 2022-05-15 DIAGNOSIS — M62830 Muscle spasm of back: Secondary | ICD-10-CM | POA: Diagnosis not present

## 2022-05-15 DIAGNOSIS — M4312 Spondylolisthesis, cervical region: Secondary | ICD-10-CM | POA: Diagnosis not present

## 2022-05-16 DIAGNOSIS — M180 Bilateral primary osteoarthritis of first carpometacarpal joints: Secondary | ICD-10-CM | POA: Diagnosis not present

## 2022-05-20 ENCOUNTER — Ambulatory Visit (INDEPENDENT_AMBULATORY_CARE_PROVIDER_SITE_OTHER)
Admission: RE | Admit: 2022-05-20 | Discharge: 2022-05-20 | Disposition: A | Discharge status: Home / Self Care | Payer: BC Managed Care – PPO | Source: Ambulatory Visit | Attending: Physician Assistant | Admitting: Physician Assistant

## 2022-05-20 DIAGNOSIS — E2839 Other primary ovarian failure: Secondary | ICD-10-CM

## 2022-05-21 DIAGNOSIS — H2511 Age-related nuclear cataract, right eye: Secondary | ICD-10-CM | POA: Diagnosis not present

## 2022-05-22 DIAGNOSIS — E2839 Other primary ovarian failure: Secondary | ICD-10-CM | POA: Diagnosis not present

## 2022-05-28 DIAGNOSIS — D487 Neoplasm of uncertain behavior of other specified sites: Secondary | ICD-10-CM | POA: Diagnosis not present

## 2022-05-28 DIAGNOSIS — D485 Neoplasm of uncertain behavior of skin: Secondary | ICD-10-CM | POA: Diagnosis not present

## 2022-05-29 DIAGNOSIS — M9901 Segmental and somatic dysfunction of cervical region: Secondary | ICD-10-CM | POA: Diagnosis not present

## 2022-05-29 DIAGNOSIS — M4312 Spondylolisthesis, cervical region: Secondary | ICD-10-CM | POA: Diagnosis not present

## 2022-05-29 DIAGNOSIS — M9902 Segmental and somatic dysfunction of thoracic region: Secondary | ICD-10-CM | POA: Diagnosis not present

## 2022-05-29 DIAGNOSIS — M62830 Muscle spasm of back: Secondary | ICD-10-CM | POA: Diagnosis not present

## 2022-06-06 ENCOUNTER — Telehealth: Payer: Self-pay | Admitting: Physician Assistant

## 2022-06-06 NOTE — Telephone Encounter (Signed)
Copied from [place] [id number]. Topic: Medicare AWV >> Jun 06, 2022 11:13 AM Gillis Santa wrote: Reason for CRM: LVM FOR PATIENT HAD TO CANCEL AWVI APPT SCHEDULED FOR 11/21 DUE TO PATIENT ONLY HAS MEDICARE PART A

## 2022-06-10 DIAGNOSIS — M4312 Spondylolisthesis, cervical region: Secondary | ICD-10-CM | POA: Diagnosis not present

## 2022-06-10 DIAGNOSIS — M62830 Muscle spasm of back: Secondary | ICD-10-CM | POA: Diagnosis not present

## 2022-06-10 DIAGNOSIS — M9902 Segmental and somatic dysfunction of thoracic region: Secondary | ICD-10-CM | POA: Diagnosis not present

## 2022-06-10 DIAGNOSIS — M9901 Segmental and somatic dysfunction of cervical region: Secondary | ICD-10-CM | POA: Diagnosis not present

## 2022-06-11 ENCOUNTER — Ambulatory Visit: Payer: BC Managed Care – PPO

## 2022-06-24 ENCOUNTER — Telehealth: Payer: Self-pay | Admitting: Physician Assistant

## 2022-06-24 NOTE — Telephone Encounter (Signed)
   LAST APPOINTMENT DATE:  05/07/22 CPE with PCP  NEXT APPOINTMENT DATE: 07/04/22 OV  with PCP  MEDICATION: ipratropium (ATROVENT) 0.06 % nasal spray [[id number]]  DISCONTINUED   Is the patient out of medication?  unknown  [id number] [address]Phone: [phone] Fax: [phone] #: [id number]  Let patient know to contact pharmacy at the end of the day to make sure medication is ready.  Please notify patient to allow 48-72 hours to process

## 2022-06-25 DIAGNOSIS — M9902 Segmental and somatic dysfunction of thoracic region: Secondary | ICD-10-CM | POA: Diagnosis not present

## 2022-06-25 DIAGNOSIS — M4312 Spondylolisthesis, cervical region: Secondary | ICD-10-CM | POA: Diagnosis not present

## 2022-06-25 DIAGNOSIS — M9901 Segmental and somatic dysfunction of cervical region: Secondary | ICD-10-CM | POA: Diagnosis not present

## 2022-06-25 DIAGNOSIS — M62830 Muscle spasm of back: Secondary | ICD-10-CM | POA: Diagnosis not present

## 2022-06-25 MED ORDER — IPRATROPIUM BROMIDE 0.06 % NA SOLN: 2.0000 | Freq: Two times a day (BID) | NASAL | 2 refills | Status: DC

## 2022-06-25 NOTE — Telephone Encounter (Signed)
[facility]

## 2022-06-25 NOTE — Telephone Encounter (Signed)
Pt requesting refill for Atrovent nasal spray, okay to send in? Pt scheduled to see you 12/14 for CPE.

## 2022-06-26 DIAGNOSIS — H25042 Posterior subcapsular polar age-related cataract, left eye: Secondary | ICD-10-CM | POA: Diagnosis not present

## 2022-06-26 DIAGNOSIS — H25012 Cortical age-related cataract, left eye: Secondary | ICD-10-CM | POA: Diagnosis not present

## 2022-06-26 DIAGNOSIS — H2512 Age-related nuclear cataract, left eye: Secondary | ICD-10-CM | POA: Diagnosis not present

## 2022-07-02 DIAGNOSIS — H25012 Cortical age-related cataract, left eye: Secondary | ICD-10-CM | POA: Diagnosis not present

## 2022-07-02 DIAGNOSIS — H25812 Combined forms of age-related cataract, left eye: Secondary | ICD-10-CM | POA: Diagnosis not present

## 2022-07-02 DIAGNOSIS — H25042 Posterior subcapsular polar age-related cataract, left eye: Secondary | ICD-10-CM | POA: Diagnosis not present

## 2022-07-02 DIAGNOSIS — H2512 Age-related nuclear cataract, left eye: Secondary | ICD-10-CM | POA: Diagnosis not present

## 2022-07-04 ENCOUNTER — Ambulatory Visit (INDEPENDENT_AMBULATORY_CARE_PROVIDER_SITE_OTHER): Payer: BC Managed Care – PPO | Admitting: Physician Assistant

## 2022-07-04 VITALS — BP 132/80 | HR 71 | Ht 67.0 in | Wt 138.8 lb

## 2022-07-04 DIAGNOSIS — M85852 Other specified disorders of bone density and structure, left thigh: Secondary | ICD-10-CM

## 2022-07-04 DIAGNOSIS — R946 Abnormal results of thyroid function studies: Secondary | ICD-10-CM | POA: Diagnosis not present

## 2022-07-04 DIAGNOSIS — R7989 Other specified abnormal findings of blood chemistry: Secondary | ICD-10-CM | POA: Diagnosis not present

## 2022-07-04 DIAGNOSIS — R197 Diarrhea, unspecified: Secondary | ICD-10-CM

## 2022-07-04 LAB — TSH: TSH: 0.26 u[IU]/mL — ABNORMAL LOW (ref 0.35–5.50)

## 2022-07-04 LAB — COMPREHENSIVE METABOLIC PANEL
ALT: 22 U/L (ref 0–35)
AST: 21 U/L (ref 0–37)
Albumin: 4.4 g/dL (ref 3.5–5.2)
Alkaline Phosphatase: 63 U/L (ref 39–117)
BUN: 23 mg/dL (ref 6–23)
CO2: 31 mEq/L (ref 19–32)
Calcium: 9.5 mg/dL (ref 8.4–10.5)
Chloride: 101 mEq/L (ref 96–112)
Creatinine, Ser: 0.86 mg/dL (ref 0.40–1.20)
GFR: 63.79 mL/min (ref >60.00)
Glucose, Bld: 71 mg/dL (ref 70–99)
Potassium: 4.2 mEq/L (ref 3.5–5.1)
Sodium: 139 mEq/L (ref 135–145)
Total Bilirubin: 0.8 mg/dL (ref 0.2–1.2)
Total Protein: 6.8 g/dL (ref 6.0–8.3)

## 2022-07-04 LAB — T3, FREE: T3, Free: 3.3 pg/mL (ref 2.3–4.2)

## 2022-07-04 LAB — VITAMIN D 25 HYDROXY (VIT D DEFICIENCY, FRACTURES): VITD: 56.71 ng/mL (ref 30.00–100.00)

## 2022-07-04 LAB — T4, FREE: Free T4: 0.78 ng/dL (ref 0.60–1.60)

## 2022-07-04 MED ORDER — ALENDRONATE SODIUM 70 MG PO TABS: 70.0000 mg | ORAL_TABLET | ORAL | 11 refills | Status: DC

## 2022-07-04 NOTE — Progress Notes (Signed)
Emma Swanson is a 81 y.o. female here for a new problem.  History of Present Illness:   Chief Complaint  Patient presents with   Follow-up    HPI  Osteopenia Patient is here to discuss bone density results. 10 year major osteoporotic risk: 22.1%. 10 year hip fracture risk: 6.5%.   He is currently taking calcium supplement and vitamin D from [employer]. He is working as a Catering manager and working full-time and gets a significant amount of walking on a daily basis.  She denies any unusual fractures.  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   Colon polyps    Family history of adverse reaction to anesthesia    see malignant hyperthermia tab    GERD (gastroesophageal reflux disease)    Hypertension    Hypothyroidism    Malignant hyperthermia    cousin's son- - malignant hyperthermia al most died  ,  patient's brother and his 2 sons tested positive for malignant hyperthermia   Palpitations    PONV (postoperative nausea and vomiting)    Vitamin D deficiency      Social History   Tobacco Use   Smoking status: Former    Types: Cigarettes   Smokeless tobacco: Never   Tobacco comments:    age of 72 until age 67   Vaping Use   Vaping Use: Never used  Substance Use Topics   Alcohol use: Yes    Comment: 1-3 glasses of wine per night   Drug use: Never    Past Surgical History:  Procedure Laterality Date   AUGMENTATION MAMMAPLASTY Bilateral    bilateral arthroscopic knee surgery      bone spur removal      BREAST BIOPSY     CLAVICLE SURGERY Right    COLONOSCOPY     left ankle surgery - had pins      RESECTION DISTAL CLAVICAL Right 11/08/2021   thyroid surgery - age 65      TOTAL KNEE ARTHROPLASTY Right 11/25/2017   Procedure: RIGHT TOTAL KNEE ARTHROPLASTY;  Surgeon: Paralee Cancel, MD;  [facility];  Service: Orthopedics;  Laterality: Right;  70 mins   TOTAL KNEE ARTHROPLASTY Left 04/16/2018    Procedure: LEFT TOTAL KNEE ARTHROPLASTY;  Surgeon: Paralee Cancel, MD;  [facility];  Service: Orthopedics;  Laterality: Left;  90 mins   TOTAL SHOULDER ARTHROPLASTY Left 02/10/2020   Procedure: TOTAL SHOULDER ARTHROPLASTY;  Surgeon: Justice Britain, MD;  [facility];  Service: Orthopedics;  Laterality: Left;  157mn    Family History  Problem Relation Age of Onset   Cancer Mother    Stroke Father    Hypertension Brother    Diabetes Brother    Heart disease Brother    Malignant hyperthermia Brother    Pancreatic cancer Paternal Aunt    Cancer Other    Pancreatic cancer Other    Malignant hyperthermia Other    Colon cancer Neg Hx    Esophageal cancer Neg Hx    Stomach cancer Neg Hx     Allergies  Allergen Reactions   Dexamethasone    Other     general anesthesia - pt's family has history of malignant hyperthermia   Oxycodone     Hallucinations    Penicillins     Childhood reaction Has patient had a PCN reaction causing immediate rash, facial/tongue/throat swelling, SOB or lightheadedness with hypotension: Unknown Has  patient had a PCN reaction causing severe rash involving mucus membranes or skin necrosis: Unknown Has patient had a PCN reaction that required hospitalization: Unknown Has patient had a PCN reaction occurring within the last 10 years: No  Tolerated ancef 02/10/2020     Current Medications:   Current Outpatient Medications:    alendronate (Emma) 70 MG tablet, Take 1 tablet (70 mg total) by mouth every 7 (seven) days. Take with a full glass of water on an empty stomach., Disp: 4 tablet, Rfl: 11   cholecalciferol (VITAMIN D3) 25 MCG (1000 UNIT) tablet, Take 5,000 Units by mouth daily., Disp: , Rfl:    clobetasol ointment (TEMOVATE) 0.05 %, Apply topically 2 (two) times daily., Disp: , Rfl:    ipratropium (ATROVENT) 0.06 % nasal spray, Place 2 sprays into both nostrils 2 (two) times daily., Disp: 15 mL, Rfl: 2   lisinopril-hydrochlorothiazide  (PRINZIDE,ZESTORETIC) 10-12.5 MG tablet, Take 1 tablet by mouth daily., Disp: , Rfl:    loperamide (IMODIUM A-D) 2 MG tablet, Take 1 tablet (2 mg total) by mouth in the morning. Increase to twice a day if needed, Disp: 30 tablet, Rfl: 0   Multiple Vitamin (MULTIVITAMIN WITH MINERALS) TABS tablet, Take 1 tablet by mouth daily., Disp: , Rfl:    Oxymetazoline HCl (NASAL SPRAY NA), Place into the nose. Daily for allergy, Disp: , Rfl:    Peppermint Oil (IBGARD) 90 MG CPCR, prn, Disp: , Rfl:    triamcinolone cream (KENALOG) 0.1 %, Apply 1 Application topically 2 (two) times daily., Disp: 30 g, Rfl: 0   zolpidem (AMBIEN) 10 MG tablet, TAKE 1 TABLET BY MOUTH EVERYDAY AT BEDTIME, Disp: 30 tablet, Rfl: 5   Review of Systems:   ROS Negative unless otherwise specified per HPI.  Vitals:   Vitals:   07/04/22 1003 07/04/22 1041  BP: (!) 148/77 132/80  Pulse: 71   SpO2: 98%   Weight: 138 lb 12.8 oz (63 kg)   Height: '5\' 7"'$  (1.702 m)      Body mass index is 21.74 kg/m.  Physical Exam:   Physical Exam Vitals and nursing note reviewed.  Constitutional:      General: She is not in acute distress.    Appearance: She is well-developed. She is not ill-appearing or toxic-appearing.  Cardiovascular:     Rate and Rhythm: Normal rate and regular rhythm.     Pulses: Normal pulses.     Heart sounds: Normal heart sounds, S1 normal and S2 normal.  Pulmonary:     Effort: Pulmonary effort is normal.     Breath sounds: Normal breath sounds.  Skin:    General: Skin is warm and dry.  Neurological:     Mental Status: She is alert.     GCS: GCS eye subscore is 4. GCS verbal subscore is 5. GCS motor subscore is 6.  Psychiatric:        Speech: Speech normal.        Behavior: Behavior normal. Behavior is cooperative.     Assessment and Plan:   Osteopenia of neck of left femur We reviewed bone density results We will update blood work to assess vitamin D today As long as vitamin D is greater than 20 we  will go ahead and initiate Emma 70 mg weekly Recommend continue vitamin D and calcium supplementation Work on weightbearing activities Follow-up in 1 year for CPE and will repeat bone density in 2 years   Inda Coke, [place]

## 2022-07-04 NOTE — Patient Instructions (Addendum)
It was great to see you!  You have osteoporosis At a minimum, recommend 800 IU of vitamin D and '1200mg'$  of Calcium per day. You can get this with a calcium-vitamin D supplement.   If you are up-to-date on your dental work and not currently having reflux symptoms- Administer first thing in the morning and >30 minutes before the first food, beverage (except plain water), or other medication of the day. Do not take with mineral water or with other beverages. Stay upright (not to lie down) for at least 30 minutes after taking medicine and until after first food of the day (to reduce irritation). Must be taken with 6 to 8 oz of plain water. The tablet should be swallowed whole; do not chew or suck.  I also recommend regular weight bearing exercise as able. Weight-bearing aerobic activities involve doing aerobic exercise on your feet, with your bones supporting your weight. Examples include walking, dancing, low-impact aerobics, elliptical training machines, stair climbing and gardening.  We will update your blood work today  Start fosamax 70 mg weekly after I give you the go ahead  We will repeat your bone density in two years    Take care,  Inda Coke PA-C

## 2022-07-05 DIAGNOSIS — L900 Lichen sclerosus et atrophicus: Secondary | ICD-10-CM | POA: Diagnosis not present

## 2022-07-18 DIAGNOSIS — M9902 Segmental and somatic dysfunction of thoracic region: Secondary | ICD-10-CM | POA: Diagnosis not present

## 2022-07-18 DIAGNOSIS — M4312 Spondylolisthesis, cervical region: Secondary | ICD-10-CM | POA: Diagnosis not present

## 2022-07-18 DIAGNOSIS — M9901 Segmental and somatic dysfunction of cervical region: Secondary | ICD-10-CM | POA: Diagnosis not present

## 2022-07-18 DIAGNOSIS — M50322 Other cervical disc degeneration at C5-C6 level: Secondary | ICD-10-CM | POA: Diagnosis not present

## 2022-07-24 DIAGNOSIS — M50322 Other cervical disc degeneration at C5-C6 level: Secondary | ICD-10-CM | POA: Diagnosis not present

## 2022-07-24 DIAGNOSIS — M9902 Segmental and somatic dysfunction of thoracic region: Secondary | ICD-10-CM | POA: Diagnosis not present

## 2022-07-24 DIAGNOSIS — M4312 Spondylolisthesis, cervical region: Secondary | ICD-10-CM | POA: Diagnosis not present

## 2022-07-24 DIAGNOSIS — M9901 Segmental and somatic dysfunction of cervical region: Secondary | ICD-10-CM | POA: Diagnosis not present

## 2022-07-29 ENCOUNTER — Other Ambulatory Visit: Payer: Self-pay | Admitting: Physician Assistant

## 2022-07-31 DIAGNOSIS — M9902 Segmental and somatic dysfunction of thoracic region: Secondary | ICD-10-CM | POA: Diagnosis not present

## 2022-07-31 DIAGNOSIS — M50322 Other cervical disc degeneration at C5-C6 level: Secondary | ICD-10-CM | POA: Diagnosis not present

## 2022-07-31 DIAGNOSIS — M9901 Segmental and somatic dysfunction of cervical region: Secondary | ICD-10-CM | POA: Diagnosis not present

## 2022-07-31 DIAGNOSIS — M4312 Spondylolisthesis, cervical region: Secondary | ICD-10-CM | POA: Diagnosis not present

## 2022-08-05 ENCOUNTER — Other Ambulatory Visit: Payer: Self-pay | Admitting: Physician Assistant

## 2022-08-05 DIAGNOSIS — G4700 Insomnia, unspecified: Secondary | ICD-10-CM

## 2022-08-05 MED ORDER — ZOLPIDEM TARTRATE 10 MG PO TABS: ORAL_TABLET | ORAL | 5 refills | Status: DC

## 2022-08-05 NOTE — Telephone Encounter (Signed)
Pt needs refill for Zolpidem 10 mg. Last OV 07/04/2022.

## 2022-08-05 NOTE — Telephone Encounter (Signed)
The following RX was previously prescribed by Dr. Orland Mustard:   LAST APPOINTMENT DATE:  07/04/22  NEXT APPOINTMENT DATE: 11/06/22  MEDICATION:  zolpidem (AMBIEN) 10 MG tablet   Is the patient out of medication? Yes  [id number] [place] [phone] Fax: [phone]   Let patient know to contact pharmacy at the end of the day to make sure medication is ready.  Please notify patient to allow 48-72 hours to process

## 2022-08-06 DIAGNOSIS — M50322 Other cervical disc degeneration at C5-C6 level: Secondary | ICD-10-CM | POA: Diagnosis not present

## 2022-08-06 DIAGNOSIS — M9901 Segmental and somatic dysfunction of cervical region: Secondary | ICD-10-CM | POA: Diagnosis not present

## 2022-08-06 DIAGNOSIS — M4312 Spondylolisthesis, cervical region: Secondary | ICD-10-CM | POA: Diagnosis not present

## 2022-08-06 DIAGNOSIS — M9902 Segmental and somatic dysfunction of thoracic region: Secondary | ICD-10-CM | POA: Diagnosis not present

## 2022-08-12 ENCOUNTER — Encounter (INDEPENDENT_AMBULATORY_CARE_PROVIDER_SITE_OTHER): Payer: Medicare Other | Admitting: Ophthalmology

## 2022-08-14 DIAGNOSIS — M9901 Segmental and somatic dysfunction of cervical region: Secondary | ICD-10-CM | POA: Diagnosis not present

## 2022-08-14 DIAGNOSIS — M50322 Other cervical disc degeneration at C5-C6 level: Secondary | ICD-10-CM | POA: Diagnosis not present

## 2022-08-14 DIAGNOSIS — M4312 Spondylolisthesis, cervical region: Secondary | ICD-10-CM | POA: Diagnosis not present

## 2022-08-14 DIAGNOSIS — M9902 Segmental and somatic dysfunction of thoracic region: Secondary | ICD-10-CM | POA: Diagnosis not present

## 2022-08-15 ENCOUNTER — Encounter (INDEPENDENT_AMBULATORY_CARE_PROVIDER_SITE_OTHER): Payer: BC Managed Care – PPO | Admitting: Ophthalmology

## 2022-08-15 DIAGNOSIS — H35371 Puckering of macula, right eye: Secondary | ICD-10-CM | POA: Diagnosis not present

## 2022-08-15 DIAGNOSIS — I10 Essential (primary) hypertension: Secondary | ICD-10-CM | POA: Diagnosis not present

## 2022-08-15 DIAGNOSIS — H35033 Hypertensive retinopathy, bilateral: Secondary | ICD-10-CM

## 2022-08-15 DIAGNOSIS — H43813 Vitreous degeneration, bilateral: Secondary | ICD-10-CM

## 2022-08-19 DIAGNOSIS — M4312 Spondylolisthesis, cervical region: Secondary | ICD-10-CM | POA: Diagnosis not present

## 2022-08-19 DIAGNOSIS — M9901 Segmental and somatic dysfunction of cervical region: Secondary | ICD-10-CM | POA: Diagnosis not present

## 2022-08-19 DIAGNOSIS — M9902 Segmental and somatic dysfunction of thoracic region: Secondary | ICD-10-CM | POA: Diagnosis not present

## 2022-08-19 DIAGNOSIS — M50322 Other cervical disc degeneration at C5-C6 level: Secondary | ICD-10-CM | POA: Diagnosis not present

## 2022-08-22 DIAGNOSIS — M50322 Other cervical disc degeneration at C5-C6 level: Secondary | ICD-10-CM | POA: Diagnosis not present

## 2022-08-22 DIAGNOSIS — M9902 Segmental and somatic dysfunction of thoracic region: Secondary | ICD-10-CM | POA: Diagnosis not present

## 2022-08-22 DIAGNOSIS — M4312 Spondylolisthesis, cervical region: Secondary | ICD-10-CM | POA: Diagnosis not present

## 2022-08-22 DIAGNOSIS — M9901 Segmental and somatic dysfunction of cervical region: Secondary | ICD-10-CM | POA: Diagnosis not present

## 2022-08-28 DIAGNOSIS — M9902 Segmental and somatic dysfunction of thoracic region: Secondary | ICD-10-CM | POA: Diagnosis not present

## 2022-08-28 DIAGNOSIS — M4312 Spondylolisthesis, cervical region: Secondary | ICD-10-CM | POA: Diagnosis not present

## 2022-08-28 DIAGNOSIS — M50322 Other cervical disc degeneration at C5-C6 level: Secondary | ICD-10-CM | POA: Diagnosis not present

## 2022-08-28 DIAGNOSIS — M9901 Segmental and somatic dysfunction of cervical region: Secondary | ICD-10-CM | POA: Diagnosis not present

## 2022-09-02 DIAGNOSIS — M4312 Spondylolisthesis, cervical region: Secondary | ICD-10-CM | POA: Diagnosis not present

## 2022-09-02 DIAGNOSIS — M9902 Segmental and somatic dysfunction of thoracic region: Secondary | ICD-10-CM | POA: Diagnosis not present

## 2022-09-02 DIAGNOSIS — M50322 Other cervical disc degeneration at C5-C6 level: Secondary | ICD-10-CM | POA: Diagnosis not present

## 2022-09-02 DIAGNOSIS — M9901 Segmental and somatic dysfunction of cervical region: Secondary | ICD-10-CM | POA: Diagnosis not present

## 2022-09-09 DIAGNOSIS — M4312 Spondylolisthesis, cervical region: Secondary | ICD-10-CM | POA: Diagnosis not present

## 2022-09-09 DIAGNOSIS — M9902 Segmental and somatic dysfunction of thoracic region: Secondary | ICD-10-CM | POA: Diagnosis not present

## 2022-09-09 DIAGNOSIS — M50322 Other cervical disc degeneration at C5-C6 level: Secondary | ICD-10-CM | POA: Diagnosis not present

## 2022-09-09 DIAGNOSIS — M9901 Segmental and somatic dysfunction of cervical region: Secondary | ICD-10-CM | POA: Diagnosis not present

## 2022-09-19 DIAGNOSIS — M9901 Segmental and somatic dysfunction of cervical region: Secondary | ICD-10-CM | POA: Diagnosis not present

## 2022-09-19 DIAGNOSIS — M9902 Segmental and somatic dysfunction of thoracic region: Secondary | ICD-10-CM | POA: Diagnosis not present

## 2022-09-19 DIAGNOSIS — M50322 Other cervical disc degeneration at C5-C6 level: Secondary | ICD-10-CM | POA: Diagnosis not present

## 2022-09-19 DIAGNOSIS — M4312 Spondylolisthesis, cervical region: Secondary | ICD-10-CM | POA: Diagnosis not present

## 2022-09-29 ENCOUNTER — Other Ambulatory Visit: Payer: Self-pay | Admitting: Physician Assistant

## 2022-10-17 ENCOUNTER — Encounter: Payer: Self-pay | Admitting: Physician Assistant

## 2022-10-17 ENCOUNTER — Ambulatory Visit (INDEPENDENT_AMBULATORY_CARE_PROVIDER_SITE_OTHER): Payer: BC Managed Care – PPO | Admitting: Physician Assistant

## 2022-10-17 VITALS — BP 127/81 | HR 68 | Temp 97.1°F | Ht 67.0 in | Wt 138.0 lb

## 2022-10-17 DIAGNOSIS — M9901 Segmental and somatic dysfunction of cervical region: Secondary | ICD-10-CM | POA: Diagnosis not present

## 2022-10-17 DIAGNOSIS — G4700 Insomnia, unspecified: Secondary | ICD-10-CM | POA: Diagnosis not present

## 2022-10-17 DIAGNOSIS — M545 Low back pain, unspecified: Secondary | ICD-10-CM | POA: Diagnosis not present

## 2022-10-17 DIAGNOSIS — M4312 Spondylolisthesis, cervical region: Secondary | ICD-10-CM | POA: Diagnosis not present

## 2022-10-17 DIAGNOSIS — J309 Allergic rhinitis, unspecified: Secondary | ICD-10-CM

## 2022-10-17 DIAGNOSIS — M50322 Other cervical disc degeneration at C5-C6 level: Secondary | ICD-10-CM | POA: Diagnosis not present

## 2022-10-17 DIAGNOSIS — M9902 Segmental and somatic dysfunction of thoracic region: Secondary | ICD-10-CM | POA: Diagnosis not present

## 2022-10-17 MED ORDER — ZOLPIDEM TARTRATE 10 MG PO TABS: ORAL_TABLET | ORAL | 5 refills | Status: DC

## 2022-10-17 MED ORDER — IPRATROPIUM BROMIDE 0.06 % NA SOLN: 2.0000 | Freq: Two times a day (BID) | NASAL | 2 refills | Status: DC

## 2022-10-17 NOTE — Patient Instructions (Signed)
It was great to see you!  You can schedule physical therapy on your way out of the office today  An order for xray has been put in for you. To have this done, you can walk in at the [facility] location without a scheduled appointment.  The address is [address]. It is across the street from [facility]. Lab and x-xray are located in the basement.   Hours of operation are M-F 8:30am to 5:00pm.  Please note that they are closed for lunch between 12:30 and 1:00pm.    Take care,  Inda Coke PA-C

## 2022-10-17 NOTE — Progress Notes (Signed)
Emma Swanson is a 82 y.o. female here for a follow up of a pre-existing problem.  History of Present Illness:   Chief Complaint  Patient presents with   Follow-up    Pt had lower back pain for couple months, wakes up at night    Medication Refill    HPI  Medication follow up:  She is requesting refills on Ambien and Ipratropium.  She denies any falls.  Ipratropium nasal spray very helpful for her.  Lower back pain: She complains of bilateral lower back pain beginning in February.  When lying in bed pain worsens when trying to turn over.  She tends to wake at night due to pain.  She denies any sciatica or pain radiating to leg, new incontinence or weakness. She often takes Ibuprofen to alleviate pain and avoids taking more than 4 a day. While working she avoids lifting heavy objects.  Her pain has not improved.  She is receptive to doing PT.  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   Colon polyps    Family history of adverse reaction to anesthesia    see malignant hyperthermia tab    GERD (gastroesophageal reflux disease)    Hypertension    Hypothyroidism    Malignant hyperthermia    cousin's son- - malignant hyperthermia al most died  ,  patient's brother and his 2 sons tested positive for malignant hyperthermia   Palpitations    PONV (postoperative nausea and vomiting)    Vitamin D deficiency      Social History   Tobacco Use   Smoking status: Former    Types: Cigarettes   Smokeless tobacco: Never   Tobacco comments:    age of 14 until age 79   Vaping Use   Vaping Use: Never used  Substance Use Topics   Alcohol use: Yes    Comment: 1-3 glasses of wine per night   Drug use: Never    Past Surgical History:  Procedure Laterality Date   AUGMENTATION MAMMAPLASTY Bilateral    bilateral arthroscopic knee surgery      bone spur removal      BREAST BIOPSY     CLAVICLE SURGERY Right    COLONOSCOPY      left ankle surgery - had pins      RESECTION DISTAL CLAVICAL Right 11/08/2021   thyroid surgery - age 70      TOTAL KNEE ARTHROPLASTY Right 11/25/2017   Procedure: RIGHT TOTAL KNEE ARTHROPLASTY;  Surgeon: Paralee Cancel, MD;  Location: [facility];  Service: Orthopedics;  Laterality: Right;  70 mins   TOTAL KNEE ARTHROPLASTY Left 04/16/2018   Procedure: LEFT TOTAL KNEE ARTHROPLASTY;  Surgeon: Paralee Cancel, MD;  Location: [facility];  Service: Orthopedics;  Laterality: Left;  90 mins   TOTAL SHOULDER ARTHROPLASTY Left 02/10/2020   Procedure: TOTAL SHOULDER ARTHROPLASTY;  Surgeon: Justice Britain, MD;  Location: [facility];  Service: Orthopedics;  Laterality: Left;  12min    Family History  Problem Relation Age of Onset   Cancer Mother    Other Mother        tumors removed from intestines - uncertain etiology   Stroke Father    Hypertension Brother    Diabetes Brother    Heart disease Brother    Malignant hyperthermia Brother    Pancreatic cancer Paternal Aunt    Cancer Other    Pancreatic cancer Other  Malignant hyperthermia Other    Colon cancer Neg Hx    Esophageal cancer Neg Hx    Stomach cancer Neg Hx     Allergies  Allergen Reactions   Dexamethasone    Other     general anesthesia - pt's family has history of malignant hyperthermia   Oxycodone     Hallucinations    Penicillins     Childhood reaction Has patient had a PCN reaction causing immediate rash, facial/tongue/throat swelling, SOB or lightheadedness with hypotension: Unknown Has patient had a PCN reaction causing severe rash involving mucus membranes or skin necrosis: Unknown Has patient had a PCN reaction that required hospitalization: Unknown Has patient had a PCN reaction occurring within the last 10 years: No  Tolerated ancef 02/10/2020     Current Medications:   Current Outpatient Medications:    alendronate (FOSAMAX) 70 MG tablet, Take 1 tablet (70 mg total) by mouth every 7 (seven) days. Take with a full glass  of water on an empty stomach., Disp: 4 tablet, Rfl: 11   cholecalciferol (VITAMIN D3) 25 MCG (1000 UNIT) tablet, Take 5,000 Units by mouth daily., Disp: , Rfl:    clobetasol ointment (TEMOVATE) 0.05 %, Apply topically 2 (two) times daily., Disp: , Rfl:    ipratropium (ATROVENT) 0.06 % nasal spray, PLACE 2 SPRAYS INTO BOTH NOSTRILS 2 (TWO) TIMES DAILY, Disp: 45 mL, Rfl: 2   lisinopril-hydrochlorothiazide (PRINZIDE,ZESTORETIC) 10-12.5 MG tablet, Take 1 tablet by mouth daily., Disp: , Rfl:    loperamide (IMODIUM A-D) 2 MG tablet, Take 1 tablet (2 mg total) by mouth in the morning. Increase to twice a day if needed, Disp: 30 tablet, Rfl: 0   Multiple Vitamin (MULTIVITAMIN WITH MINERALS) TABS tablet, Take 1 tablet by mouth daily., Disp: , Rfl:    Oxymetazoline HCl (NASAL SPRAY NA), Place into the nose. Daily for allergy, Disp: , Rfl:    Peppermint Oil (IBGARD) 90 MG CPCR, prn, Disp: , Rfl:    triamcinolone cream (KENALOG) 0.1 %, Apply 1 Application topically 2 (two) times daily., Disp: 30 g, Rfl: 0   zolpidem (AMBIEN) 10 MG tablet, TAKE 1 TABLET BY MOUTH EVERYDAY AT BEDTIME, Disp: 30 tablet, Rfl: 5   Review of Systems:   Review of Systems  Musculoskeletal:  Positive for back pain (bilateral lower back).    Vitals:   Vitals:   10/17/22 0931  BP: 127/81  Pulse: 68  Temp: (!) 97.1 F (36.2 C)  TempSrc: Temporal  SpO2: 98%  Weight: 138 lb (62.6 kg)  Height: 5\' 7"  (1.702 m)     Body mass index is 21.61 kg/m.  Physical Exam:   Physical Exam Vitals and nursing note reviewed.  Constitutional:      General: She is not in acute distress.    Appearance: She is well-developed. She is not ill-appearing or toxic-appearing.  Cardiovascular:     Rate and Rhythm: Normal rate and regular rhythm.     Pulses: Normal pulses.     Heart sounds: Normal heart sounds, S1 normal and S2 normal.  Pulmonary:     Effort: Pulmonary effort is normal.     Breath sounds: Normal breath sounds.   Musculoskeletal:     Comments: No decreased ROM 2/2 pain with flexion/extension, lateral side bends, or rotation. No reproducible tenderness with deep palpation to bilateral paraspinal lumbar muscles. No bony tenderness. No evidence of erythema, rash or ecchymosis.    Skin:    General: Skin is warm and dry.  Neurological:  Mental Status: She is alert.     GCS: GCS eye subscore is 4. GCS verbal subscore is 5. GCS motor subscore is 6.  Psychiatric:        Speech: Speech normal.        Behavior: Behavior normal. Behavior is cooperative.     Assessment and Plan:   Insomnia, unspecified type Well controlled Continue ambien 10 mg daily Denies concerning side effects or falls Will continue this medication Needs 6 month follow-up  Lumbar pain Suspect MSK- strain Obtain xray due to hx of osteoporosis and age PT referral also placed Follow-up if new/worsening sx No red flags  Allergic rhinitis, unspecified seasonality, unspecified trigger Well controlled with ipratropium Continue to monitor sx and follow-up if becomes uncontrolled   I,Rachel Rivera,acting as a scribe for [employer], PA.,have documented all relevant documentation on the behalf of Inda Coke, PA,as directed by  Inda Coke, PA while in the presence of Inda Coke, [place].   I, Inda Coke, [place], have reviewed all documentation for this visit. The documentation on 10/17/22 for the exam, diagnosis, procedures, and orders are all accurate and complete.   Inda Coke, PA-C

## 2022-10-21 ENCOUNTER — Ambulatory Visit (INDEPENDENT_AMBULATORY_CARE_PROVIDER_SITE_OTHER)
Admission: RE | Admit: 2022-10-21 | Discharge: 2022-10-21 | Disposition: A | Discharge status: Home / Self Care | Payer: BC Managed Care – PPO | Source: Ambulatory Visit | Attending: Physician Assistant | Admitting: Physician Assistant

## 2022-10-21 DIAGNOSIS — M47816 Spondylosis without myelopathy or radiculopathy, lumbar region: Secondary | ICD-10-CM | POA: Diagnosis not present

## 2022-10-21 DIAGNOSIS — M545 Low back pain, unspecified: Secondary | ICD-10-CM | POA: Diagnosis not present

## 2022-10-22 DIAGNOSIS — M9901 Segmental and somatic dysfunction of cervical region: Secondary | ICD-10-CM | POA: Diagnosis not present

## 2022-10-22 DIAGNOSIS — M4312 Spondylolisthesis, cervical region: Secondary | ICD-10-CM | POA: Diagnosis not present

## 2022-10-22 DIAGNOSIS — M9902 Segmental and somatic dysfunction of thoracic region: Secondary | ICD-10-CM | POA: Diagnosis not present

## 2022-10-22 DIAGNOSIS — M50322 Other cervical disc degeneration at C5-C6 level: Secondary | ICD-10-CM | POA: Diagnosis not present

## 2022-10-29 DIAGNOSIS — M50322 Other cervical disc degeneration at C5-C6 level: Secondary | ICD-10-CM | POA: Diagnosis not present

## 2022-10-29 DIAGNOSIS — M9902 Segmental and somatic dysfunction of thoracic region: Secondary | ICD-10-CM | POA: Diagnosis not present

## 2022-10-29 DIAGNOSIS — M4312 Spondylolisthesis, cervical region: Secondary | ICD-10-CM | POA: Diagnosis not present

## 2022-10-29 DIAGNOSIS — M9901 Segmental and somatic dysfunction of cervical region: Secondary | ICD-10-CM | POA: Diagnosis not present

## 2022-10-31 ENCOUNTER — Other Ambulatory Visit: Payer: Self-pay | Admitting: Physician Assistant

## 2022-10-31 DIAGNOSIS — Z0000 Encounter for general adult medical examination without abnormal findings: Secondary | ICD-10-CM

## 2022-11-05 DIAGNOSIS — M9901 Segmental and somatic dysfunction of cervical region: Secondary | ICD-10-CM | POA: Diagnosis not present

## 2022-11-05 DIAGNOSIS — M50322 Other cervical disc degeneration at C5-C6 level: Secondary | ICD-10-CM | POA: Diagnosis not present

## 2022-11-05 DIAGNOSIS — M9902 Segmental and somatic dysfunction of thoracic region: Secondary | ICD-10-CM | POA: Diagnosis not present

## 2022-11-05 DIAGNOSIS — M4312 Spondylolisthesis, cervical region: Secondary | ICD-10-CM | POA: Diagnosis not present

## 2022-11-06 ENCOUNTER — Ambulatory Visit: Payer: BC Managed Care – PPO | Admitting: Physician Assistant

## 2022-11-07 DIAGNOSIS — D692 Other nonthrombocytopenic purpura: Secondary | ICD-10-CM | POA: Diagnosis not present

## 2022-11-07 DIAGNOSIS — L309 Dermatitis, unspecified: Secondary | ICD-10-CM | POA: Diagnosis not present

## 2022-11-11 ENCOUNTER — Ambulatory Visit: Payer: BC Managed Care – PPO | Admitting: Physical Therapy

## 2022-11-20 DIAGNOSIS — M4312 Spondylolisthesis, cervical region: Secondary | ICD-10-CM | POA: Diagnosis not present

## 2022-11-20 DIAGNOSIS — M50322 Other cervical disc degeneration at C5-C6 level: Secondary | ICD-10-CM | POA: Diagnosis not present

## 2022-11-20 DIAGNOSIS — M9902 Segmental and somatic dysfunction of thoracic region: Secondary | ICD-10-CM | POA: Diagnosis not present

## 2022-11-20 DIAGNOSIS — M9901 Segmental and somatic dysfunction of cervical region: Secondary | ICD-10-CM | POA: Diagnosis not present

## 2022-11-21 ENCOUNTER — Ambulatory Visit (INDEPENDENT_AMBULATORY_CARE_PROVIDER_SITE_OTHER): Payer: BC Managed Care – PPO | Admitting: Physical Therapy

## 2022-11-21 ENCOUNTER — Encounter: Payer: Self-pay | Admitting: Physical Therapy

## 2022-11-21 DIAGNOSIS — M5459 Other low back pain: Secondary | ICD-10-CM

## 2022-11-21 DIAGNOSIS — M6281 Muscle weakness (generalized): Secondary | ICD-10-CM | POA: Diagnosis not present

## 2022-11-21 NOTE — Progress Notes (Signed)
OUTPATIENT PHYSICAL THERAPY THORACOLUMBAR EVALUATION   Patient Name: Kaniya Trueheart MRN: [id number] DOB:August 17, 1940, 82 y.o., female Today's Date: 11/21/2022  END OF SESSION:  PT End of Session - 11/21/22 1502     Visit Number 1    Number of Visits 12    Date for PT Re-Evaluation 02/13/23    Authorization Type BCBS VL 25, medicare    Authorization - Visit Number 1    Authorization - Number of Visits 25    Progress Note Due on Visit 10    PT Start Time 1516    PT Stop Time 1559    PT Time Calculation (min) 43 min    Activity Tolerance Patient tolerated treatment well             Past Medical History:  Diagnosis Date   Arthritis    Asthma    Cancer ([facility])    basal cells   Colon polyps    Family history of adverse reaction to anesthesia    see malignant hyperthermia tab    GERD (gastroesophageal reflux disease)    Hypertension    Hypothyroidism    Malignant hyperthermia    cousin's son- - malignant hyperthermia al most died  ,  patient's brother and his 2 sons tested positive for malignant hyperthermia   Palpitations    PONV (postoperative nausea and vomiting)    Vitamin D deficiency    Past Surgical History:  Procedure Laterality Date   AUGMENTATION MAMMAPLASTY Bilateral    bilateral arthroscopic knee surgery      bone spur removal      BREAST BIOPSY     CLAVICLE SURGERY Right    COLONOSCOPY     left ankle surgery - had pins      RESECTION DISTAL CLAVICAL Right 11/08/2021   thyroid surgery - age 82      TOTAL KNEE ARTHROPLASTY Right 11/25/2017   Procedure: RIGHT TOTAL KNEE ARTHROPLASTY;  Surgeon: Durene Romans, MD;  Location: [facility];  Service: Orthopedics;  Laterality: Right;  70 mins   TOTAL KNEE ARTHROPLASTY Left 04/16/2018   Procedure: LEFT TOTAL KNEE ARTHROPLASTY;  Surgeon: Durene Romans, MD;  Location: [facility];  Service: Orthopedics;  Laterality: Left;  90 mins   TOTAL SHOULDER ARTHROPLASTY Left 02/10/2020   Procedure: TOTAL SHOULDER ARTHROPLASTY;  Surgeon:  Francena Hanly, MD;  Location: [facility];  Service: Orthopedics;  Laterality: Left;    Patient Active Problem List   Diagnosis Date Noted   Epistaxis 12/10/2021   Primary hypertension 12/10/2021   Insomnia 12/10/2021   Hepatic steatosis 11/01/2021   Closed fracture of right clavicle 04/16/2021   Impairment of balance 10/27/2020   Closed fracture of tibia and fibula 10/11/2020   Pain in left foot 10/11/2020   S/P reverse total shoulder arthroplasty, 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 Comes to PT for her low back. Currently seeing Chiropractor  for back and she gets  manipulation and DN.  States she goes [facility] 1-2x/week. History of clavicle break from fall from dehydration (occurred over a year ago). States her new pain in her back started in February as she has been sitting more since her clavicle break.  No symptoms in her legs noted. 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 States she can't squat well as she has had both knees replaced.   PERTINENT HISTORY:  B TKA, left shoulder replacement, left ankle ORIF, HTN, poor balance on left foot.  PAIN:  Are you having pain? Yes: NPRS scale: just there no number  given/10 Pain location: low back, mid back  Pain description: dull ache  Aggravating factors: working on a cart on an airplane Relieving factors: sitting  PRECAUTIONS: None  WEIGHT BEARING RESTRICTIONS: No  FALLS:  Has patient fallen in last 6 months? Yes. Number of falls 1 a couple fays ago with bending over to free a cricket and hearing a plane overhead she looked up and slipped on her right side    OCCUPATION: flight attendant  - full time   PLOF: Independent, garden, scuba diving   PATIENT GOALS: to have less pain    OBJECTIVE:   DIAGNOSTIC FINDINGS:  Xray 10/21/22 LUMBAR SPINE - COMPLETE 4+ VIEW   COMPARISON:  None Available.   FINDINGS: No fracture.  No bone lesion.   Grade 1 anterolisthesis of L4 on L5. No other spondylolisthesis. Mild loss of disc height L2-L3, moderate loss of disc at L3-L4 and marked loss of disc height at L4-L5. Moderate loss of height at L5-S1.   Facet degenerative changes bilaterally from L3-L4 through L5-S1. Mild levoscoliosis, apex at L3.   Scattered aortic atherosclerotic calcifications soft tissues otherwise unremarkable.   IMPRESSION: 1. No fracture or acute finding. 2. Degenerative changes detailed including an anterolisthesis L4 on.  PATIENT SURVEYS:  FOTO 65%  SCREENING FOR RED FLAGS: Bowel or bladder incontinence: No Spinal tumors: No Cauda equina syndrome: No Compression fracture: No Abdominal aneurysm: No  COGNITION: Overall cognitive status: Within functional limits for tasks assessed      POSTURE: rounded shoulders and forward head  PALPATION: Slight tenderness to palpation along right ribs- no tender spots along paraspinals  LUMBAR ROM: WNL in all directions except in extension which was 50%     LE Measurements Lower Extremity Right 11/21/2022 Left 11/21/2022   A/PROM MMT A/PROM MMT  Hip Flexion WFL 4 WFL 4  Hip Extension WFL 4- WFL 4-  Hip Abduction      Hip Adduction      Hip Internal rotation [facility]   Hip External rotation [facility]  WFL   Knee Flexion  4-  4-  Knee Extension  4  4  Ankle Dorsiflexion  4+  4+  Ankle Plantarflexion      Ankle Inversion      Ankle Eversion       (Blank rows = not tested) * pain   LUMBAR SPECIAL TESTS:  Straight leg raise test: Negative and Slump test: Negative  FUNCTIONAL TESTS:  Bed mobility - turning over - slow movements (due to rib pain)   TODAY'S TREATMENT:  DATE:    11/21/2022  Therapeutic Exercise:  Aerobic: Supine: Prone: lying over pillow 5 minutes, hamstring curls x15 B  Seated:  Standing: Neuromuscular Re-education: Manual Therapy: Therapeutic Activity: Self Care: Trigger Point Dry Needling:  Modalities:    PATIENT EDUCATION:  Education details: on current presentation, on HEP, on clinical outcomes score and POC Person educated: Patient Education method: Programmer, multimedia, Demonstration, and Handouts Education comprehension: verbalized understanding   HOME EXERCISE PROGRAM: KCRAEWBY  ASSESSMENT:  CLINICAL IMPRESSION: Patient presents to physical therapy with complaints of chronic back pain but with recent increase in pain earlier this year that was different from her chronic pain.  Patient is very active in regards to traveling and social life but does not perform regular exercise routine.  Patient does go to [facility] 1-2 times a week when her schedule allows and also sees a chiropractor regularly for maintenance of low back pain.  Patient demonstrates weakness in range of motion limitations that are likely contributing to current presentation and would greatly benefit from skilled physical therapy to improve physical impairments and improve ability to perform job tasks.  OBJECTIVE IMPAIRMENTS: decreased activity tolerance, decreased ROM, decreased strength, postural dysfunction, and pain.    ACTIVITY LIMITATIONS: lifting, bending, squatting, and transfers  PARTICIPATION LIMITATIONS: cleaning and occupation  PERSONAL FACTORS: Age, Fitness, and 1 comorbidity: B TKA  are also affecting patient's functional outcome.   REHAB POTENTIAL: Good  CLINICAL DECISION MAKING: Stable/uncomplicated  EVALUATION COMPLEXITY: Low   GOALS: Goals reviewed with patient? yes  SHORT TERM GOALS: Target date: 01/02/2023  Patient will be independent in self management strategies to improve quality of life and functional outcomes. Baseline: New Program Goal status: INITIAL  2.  Patient will report at least 50% improvement in overall symptoms and/or function to demonstrate improved functional mobility Baseline: 0% better Goal status: INITIAL    LONG TERM GOALS: Target date: 02/13/2023   Patient will report at least 75% improvement in overall symptoms and/or function to demonstrate improved functional mobility Baseline: 0% better Goal status: INITIAL  2.  Patient will improve score on FOTO outcomes measure to projected score to demonstrate overall improved function and QOL Baseline: see above Goal status: INITIAL  3.  Patient will report performing regular exercises to help with strength and mobility Baseline:  not currently  Goal status: INITIAL  4.  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 Baseline: painful/unable Goal status: INITIAL   PLAN:  PT FREQUENCY: 1-2x/week for total of 12 visits over 12 weeks certification period  PT DURATION: 12 weeks  PLANNED INTERVENTIONS: Therapeutic exercises, Therapeutic activity, Neuromuscular re-education, Balance training, Gait training, Patient/Family education, Self Care, Joint mobilization, Joint manipulation, Stair training, Orthotic/Fit training, DME instructions, Aquatic Therapy, Dry Needling, Electrical stimulation, Spinal  manipulation, Spinal mobilization, Cryotherapy, Moist heat, Traction, Ultrasound, Ionotophoresis 4mg /ml Dexamethasone, Manual therapy, and Re-evaluation.  PLAN FOR NEXT SESSION: Focus on developing home exercise program patient can perform anywhere as she travels for work (flight attendant), lower extremity and lumbar strength and mobility   4:14 PM, 11/21/22 Tereasa Coop, DPT Physical Therapy with [facility]

## 2022-11-22 ENCOUNTER — Telehealth: Payer: Self-pay | Admitting: Physician Assistant

## 2022-11-22 NOTE — Telephone Encounter (Signed)
Patient states: -Leaving country on 5/8 but will run out of [place] while gone  - Earliest fill date is 5/10 - Pharmacy recommended asking PCP to allow early refill of ambien no later than 5/7.   Please Advise.

## 2022-11-22 NOTE — Telephone Encounter (Signed)
Called [facility] pharmacy and spoke to [place] told him okay to refill Ambien early due to going out of country per [place]. Sanjay verbalized understanding.

## 2022-11-22 NOTE — Telephone Encounter (Signed)
Spoke to pt told her spoke to pharmacy okay for them to refill Ambien early. Pt verbalized understanding.

## 2022-11-26 DIAGNOSIS — M9901 Segmental and somatic dysfunction of cervical region: Secondary | ICD-10-CM | POA: Diagnosis not present

## 2022-11-26 DIAGNOSIS — M4312 Spondylolisthesis, cervical region: Secondary | ICD-10-CM | POA: Diagnosis not present

## 2022-11-26 DIAGNOSIS — M9902 Segmental and somatic dysfunction of thoracic region: Secondary | ICD-10-CM | POA: Diagnosis not present

## 2022-11-26 DIAGNOSIS — M50322 Other cervical disc degeneration at C5-C6 level: Secondary | ICD-10-CM | POA: Diagnosis not present

## 2022-12-04 ENCOUNTER — Ambulatory Visit (INDEPENDENT_AMBULATORY_CARE_PROVIDER_SITE_OTHER): Payer: BC Managed Care – PPO | Admitting: Physical Therapy

## 2022-12-04 ENCOUNTER — Encounter: Payer: Self-pay | Admitting: Physical Therapy

## 2022-12-04 DIAGNOSIS — M6281 Muscle weakness (generalized): Secondary | ICD-10-CM | POA: Diagnosis not present

## 2022-12-04 DIAGNOSIS — M5459 Other low back pain: Secondary | ICD-10-CM

## 2022-12-04 NOTE — Therapy (Signed)
OUTPATIENT PHYSICAL THERAPY THORACOLUMBAR TREATMENT     Patient Name: Emma Swanson MRN: [id number] DOB:Jan 22, 1941, 82 y.o., female Today's Date: 11/21/2022    PT End of Session - 12/04/22 1128     Visit Number 2    Number of Visits 12    Date for PT Re-Evaluation 02/13/23    Authorization Type BCBS VL 25, medicare    Authorization - Visit Number 2    Authorization - Number of Visits 25    Progress Note Due on Visit 10    PT Start Time 1104    PT Stop Time 1143    PT Time Calculation (min) 39 min    Activity Tolerance Patient tolerated treatment well    Behavior During Therapy WFL for tasks assessed/performed                   Past Medical History:  Diagnosis Date   Arthritis     Asthma     Cancer (HCC)      basal cells   Colon polyps     Family history of adverse reaction to anesthesia      see malignant hyperthermia tab    GERD (gastroesophageal reflux disease)     Hypertension     Hypothyroidism     Malignant hyperthermia      cousin's son- - malignant hyperthermia al most died  ,  patient's brother and his 2 sons tested positive for malignant hyperthermia   Palpitations     PONV (postoperative nausea and vomiting)     Vitamin D deficiency           Past Surgical History:  Procedure Laterality Date   AUGMENTATION MAMMAPLASTY Bilateral     bilateral arthroscopic knee surgery        bone spur removal        BREAST BIOPSY       CLAVICLE SURGERY Right     COLONOSCOPY       left ankle surgery - had pins        RESECTION DISTAL CLAVICAL Right 11/08/2021   thyroid surgery - age 35        TOTAL KNEE ARTHROPLASTY Right 11/25/2017    Procedure: RIGHT TOTAL KNEE ARTHROPLASTY;  Surgeon: Durene Romans, MD;  Location: [facility];  Service: Orthopedics;  Laterality: Right;  70 mins   TOTAL KNEE ARTHROPLASTY Left 04/16/2018    Procedure: LEFT TOTAL KNEE ARTHROPLASTY;  Surgeon: Durene Romans, MD;  Location: [facility];  Service: Orthopedics;  Laterality: Left;  90 mins    TOTAL SHOULDER ARTHROPLASTY Left 02/10/2020    Procedure: TOTAL SHOULDER ARTHROPLASTY;  Surgeon: Francena Hanly, MD;  Location: [facility];  Service: Orthopedics;  Laterality: Left;         Patient Active Problem List    Diagnosis Date Noted   Epistaxis 12/10/2021   Primary hypertension 12/10/2021   Insomnia 12/10/2021   Hepatic steatosis 11/01/2021   Closed fracture of right clavicle 04/16/2021   Impairment of balance 10/27/2020   Closed fracture of tibia and fibula 10/11/2020   Pain in left foot 10/11/2020   S/P reverse total shoulder arthroplasty, 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   Arthritis of hand 05/26/2018   S/P left TKA 04/16/2018   S/P right TKA 11/25/2017   Osteoarthritis of left glenohumeral joint 07/25/2017      PCP: Jarold Motto PA    REFERRING PROVIDER: Jarold Motto PA    REFERRING DIAG: M54.50 (ICD-10-CM) - Lumbar pain   Rationale for Evaluation and Treatment:  Rehabilitation   THERAPY DIAG:  Other low back pain   Muscle weakness (generalized)   ONSET DATE: 08/2022   SUBJECTIVE:                                                                                                                                                                                            SUBJECTIVE STATEMENT:  I'm doing OK, haven't done a great job keeping up with HEP due to work. Back is on and off, after a flight it really hurts and other times it doesn't bother me at all.   EVAL:States she fells 2 days ago - freak accident with current right wrist hematoma and slight soreness in right shoulder. Comes to PT for her low back. Currently seeing Chiropractor  for back and she gets  manipulation and DN.  States she [facility] 1-2x/week. History of clavicle break from fall from dehydration (occurred over a year ago). States her new pain in her back started in February as she has been sitting more since her clavicle break.  No symptoms in her legs noted. 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 States she can't squat well as she has had both knees replaced.    PERTINENT HISTORY:  B TKA, left shoulder replacement, left ankle ORIF, HTN, poor balance on left foot.   PAIN:  Are you having pain? No: NPRS scale: 0/10 Pain location:  Pain description:  Aggravating factors:  Relieving factors:    PRECAUTIONS: None   WEIGHT BEARING RESTRICTIONS: No   FALLS:  Has patient fallen in last 6 months? Yes. Number of falls 1 a couple fays ago with bending over to free a cricket and hearing a plane overhead she looked up and slipped on her right side      OCCUPATION: flight attendant  - full time    PLOF: Independent, garden, scuba diving    PATIENT GOALS: to have less pain      OBJECTIVE:    DIAGNOSTIC FINDINGS:  Xray 10/21/22 LUMBAR SPINE - COMPLETE 4+ VIEW   COMPARISON:  None Available.   FINDINGS: No fracture.  No bone lesion.   Grade 1 anterolisthesis of L4 on L5. No other spondylolisthesis. Mild loss of disc height L2-L3, moderate loss of disc at L3-L4 and marked loss of disc height at L4-L5. Moderate loss of height at L5-S1.   Facet degenerative changes bilaterally from L3-L4 through L5-S1. Mild levoscoliosis, apex at L3.   Scattered aortic atherosclerotic calcifications soft tissues otherwise unremarkable.   IMPRESSION: 1. No fracture or acute finding. 2.  Degenerative changes detailed including an anterolisthesis L4 on.   PATIENT SURVEYS:  FOTO 65%   SCREENING FOR RED FLAGS: Bowel or bladder incontinence: No Spinal tumors: No Cauda equina syndrome: No Compression fracture: No Abdominal aneurysm: No   COGNITION: Overall cognitive status: Within functional limits for tasks assessed                   POSTURE: rounded shoulders and forward head   PALPATION: Slight tenderness to palpation along right ribs- no tender spots along paraspinals   LUMBAR ROM: WNL in all directions except in extension  which was 50%        LE Measurements       Lower Extremity Right 11/21/2022 Left 11/21/2022    A/PROM MMT A/PROM MMT  Hip Flexion WFL 4 WFL 4  Hip Extension WFL 4- WFL 4-  Hip Abduction          Hip Adduction          Hip Internal rotation [facility]   [facility]    Hip External rotation [facility]   WFL    Knee Flexion   4-   4-  Knee Extension   4   4  Ankle Dorsiflexion   4+   4+  Ankle Plantarflexion          Ankle Inversion          Ankle Eversion           (Blank rows = not tested) * pain     LUMBAR SPECIAL TESTS:  Straight leg raise test: Negative and Slump test: Negative   FUNCTIONAL TESTS:  Bed mobility - turning over - slow movements (due to rib pain)     TODAY'S TREATMENT:                                                                                                                              DATE:    12/04/22  TherEx  Bridges x10 with 3 second holds  Sidelying clams x10 red TB  Bridges + ABD x10 red TB Lumbar rotation stretch 5x5 seconds B  SKTC stretch 5x5 seconds B Seated QL stretch 3x30 seconds  Seated marches + TA set x10 B Lateral elbow to mat table + TA set x10 B Hip hikes x10 B  Hip hikes + ABD x10 B Forward step ups x10 B 4 inch step Lateral step ups x10 B 4 inch step       11/21/2022   Therapeutic Exercise:    Aerobic: Supine: Prone: lying over pillow 5 minutes, hamstring curls x15 B    Seated:    Standing: Neuromuscular Re-education: Manual Therapy: Therapeutic Activity: Self Care: Trigger Point Dry Needling:  Modalities:      PATIENT EDUCATION:  Education details: on current presentation, on HEP, on clinical outcomes score and POC Person educated: Patient Education method: Explanation, Demonstration, and Handouts Education comprehension: verbalized understanding     HOME EXERCISE  PROGRAM: KCRAEWBY   ASSESSMENT:   CLINICAL IMPRESSION:  Pam arrives today doing OK, has had a hard time keeping up with HEP due to travel schedule with  work but her back is feeling fairly well today. Worked on lumbar mobility and general strengthening as per POC, tolerated all tasks well. Did not update HEP due to mixed compliance. Will continue to progress as able.    OBJECTIVE IMPAIRMENTS: decreased activity tolerance, decreased ROM, decreased strength, postural dysfunction, and pain.    ACTIVITY LIMITATIONS: lifting, bending, squatting, and transfers   PARTICIPATION LIMITATIONS: cleaning and occupation   PERSONAL FACTORS: Age, Fitness, and 1 comorbidity: B TKA  are also affecting patient's functional outcome.    REHAB POTENTIAL: Good   CLINICAL DECISION MAKING: Stable/uncomplicated   EVALUATION COMPLEXITY: Low     GOALS: Goals reviewed with patient? yes   SHORT TERM GOALS: Target date: 01/02/2023  Patient will be independent in self management strategies to improve quality of life and functional outcomes. Baseline: New Program Goal status: INITIAL   2.  Patient will report at least 50% improvement in overall symptoms and/or function to demonstrate improved functional mobility Baseline: 0% better Goal status: INITIAL       LONG TERM GOALS: Target date: 02/13/2023    Patient will report at least 75% improvement in overall symptoms and/or function to demonstrate improved functional mobility Baseline: 0% better Goal status: INITIAL   2.  Patient will improve score on FOTO outcomes measure to projected score to demonstrate overall improved function and QOL Baseline: see above Goal status: INITIAL   3.  Patient will report performing regular exercises to help with strength and mobility Baseline:  not currently  Goal status: INITIAL   4.  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 Baseline: painful/unable Goal status: INITIAL     PLAN:   PT FREQUENCY: 1-2x/week for total of 12 visits over 12 weeks certification  period   PT DURATION: 12 weeks   PLANNED INTERVENTIONS: Therapeutic exercises, Therapeutic activity, Neuromuscular re-education, Balance training, Gait training, Patient/Family education, Self Care, Joint mobilization, Joint manipulation, Stair training, Orthotic/Fit training, DME instructions, Aquatic Therapy, Dry Needling, Electrical stimulation, Spinal manipulation, Spinal mobilization, Cryotherapy, Moist heat, Traction, Ultrasound, Ionotophoresis 4mg /ml Dexamethasone, Manual therapy, and Re-evaluation.   PLAN FOR NEXT SESSION: Focus on developing home exercise program patient can perform consistently anywhere as she travels for work (flight attendant), lower extremity and lumbar strength and mobility. Problem solve mechanics for dealing with reaching into cart on planes      Nedra Hai PT DPT PN2

## 2022-12-05 ENCOUNTER — Ambulatory Visit (INDEPENDENT_AMBULATORY_CARE_PROVIDER_SITE_OTHER): Payer: BC Managed Care – PPO | Admitting: Physical Therapy

## 2022-12-05 ENCOUNTER — Encounter: Payer: Self-pay | Admitting: Physical Therapy

## 2022-12-05 DIAGNOSIS — M5459 Other low back pain: Secondary | ICD-10-CM

## 2022-12-05 DIAGNOSIS — M6281 Muscle weakness (generalized): Secondary | ICD-10-CM | POA: Diagnosis not present

## 2022-12-05 NOTE — Therapy (Signed)
OUTPATIENT PHYSICAL THERAPY THORACOLUMBAR TREATMENT     Patient Name: Emma Swanson MRN: [id number] DOB:[birth year], 82 y.o., female Today's Date: 11/21/2022    PT End of Session - 12/05/22 1026     Visit Number 3    Number of Visits 12    Date for PT Re-Evaluation 02/13/23    Authorization Type BCBS VL 25, medicare    Progress Note Due on Visit 10    PT Start Time 1018    PT Stop Time 1056    PT Time Calculation (min) 38 min    Activity Tolerance Patient tolerated treatment well    Behavior During Therapy WFL for tasks assessed/performed                    Past Medical History:  Diagnosis Date   Arthritis     Asthma     Cancer (HCC)      basal cells   Colon polyps     Family history of adverse reaction to anesthesia      see malignant hyperthermia tab    GERD (gastroesophageal reflux disease)     Hypertension     Hypothyroidism     Malignant hyperthermia      cousin's son- - malignant hyperthermia al most died  ,  patient's brother and his 2 sons tested positive for malignant hyperthermia   Palpitations     PONV (postoperative nausea and vomiting)     Vitamin D deficiency           Past Surgical History:  Procedure Laterality Date   AUGMENTATION MAMMAPLASTY Bilateral     bilateral arthroscopic knee surgery        bone spur removal        BREAST BIOPSY       CLAVICLE SURGERY Right     COLONOSCOPY       left ankle surgery - had pins        RESECTION DISTAL CLAVICAL Right 11/08/2021   thyroid surgery - age 23        TOTAL KNEE ARTHROPLASTY Right 11/25/2017    Procedure: RIGHT TOTAL KNEE ARTHROPLASTY;  Surgeon: Durene Romans, MD;  Location: [facility];  Service: Orthopedics;  Laterality: Right;  70 mins   TOTAL KNEE ARTHROPLASTY Left 04/16/2018    Procedure: LEFT TOTAL KNEE ARTHROPLASTY;  Surgeon: Durene Romans, MD;  Location: [facility];  Service: Orthopedics;  Laterality: Left;  90 mins   TOTAL SHOULDER ARTHROPLASTY Left 02/10/2020    Procedure: TOTAL SHOULDER  ARTHROPLASTY;  Surgeon: Francena Hanly, MD;  Location: [facility];  Service: Orthopedics;  Laterality: Left;         Patient Active Problem List    Diagnosis Date Noted   Epistaxis 12/10/2021   Primary hypertension 12/10/2021   Insomnia 12/10/2021   Hepatic steatosis 11/01/2021   Closed fracture of right clavicle 04/16/2021   Impairment of balance 10/27/2020   Closed fracture of tibia and fibula 10/11/2020   Pain in left foot 10/11/2020   S/P reverse total shoulder arthroplasty, 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   Arthritis of hand 05/26/2018   S/P left TKA 04/16/2018   S/P right TKA 11/25/2017   Osteoarthritis of left glenohumeral joint 07/25/2017      PCP: Jarold Motto PA    REFERRING PROVIDER: Jarold Motto PA    REFERRING DIAG: M54.50 (ICD-10-CM) - Lumbar pain   Rationale for Evaluation and Treatment: Rehabilitation   THERAPY DIAG:  Other low back pain   Muscle weakness (generalized)  ONSET DATE: 08/2022   SUBJECTIVE:                                                                                                                                                                                            SUBJECTIVE STATEMENT:  Had that gym class last night on top of PT, feel good except for my wrists. Felt fine from what we did in PT last time too. Gym modified things for me too. Nothing really going on otherwise.   EVAL:States she fells 2 days ago - freak accident with current right wrist hematoma and slight soreness in right shoulder. Comes to PT for her low back. Currently seeing Chiropractor  for back and she gets  manipulation and DN.  States she goes to stretch zone 1-2x/week. History of clavicle break from fall from dehydration (occurred over a year ago). States her new pain in her back started in February as she has been sitting more since her clavicle break.  No symptoms in her legs noted. 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 States she can't squat well as she has had both knees replaced.    PERTINENT HISTORY:  B TKA, left shoulder replacement, left ankle ORIF, HTN, poor balance on left foot.   PAIN:  Are you having pain? No: NPRS scale: 0/10 Pain location:  Pain description:  Aggravating factors:  Relieving factors:    PRECAUTIONS: None   WEIGHT BEARING RESTRICTIONS: No   FALLS:  Has patient fallen in last 6 months? Yes. Number of falls 1 a couple fays ago with bending over to free a cricket and hearing a plane overhead she looked up and slipped on her right side      OCCUPATION: flight attendant  - full time    PLOF: Independent, garden, scuba diving    PATIENT GOALS: to have less pain      OBJECTIVE:    DIAGNOSTIC FINDINGS:  Xray 10/21/22 LUMBAR SPINE - COMPLETE 4+ VIEW   COMPARISON:  None Available.   FINDINGS: No fracture.  No bone lesion.   Grade 1 anterolisthesis of L4 on L5. No other spondylolisthesis. Mild loss of disc height L2-L3, moderate loss of disc at L3-L4 and marked loss of disc height at L4-L5. Moderate loss of height at L5-S1.   Facet degenerative changes bilaterally from L3-L4 through L5-S1. Mild levoscoliosis, apex at L3.   Scattered aortic atherosclerotic calcifications soft tissues otherwise unremarkable.   IMPRESSION: 1. No fracture or acute finding. 2. Degenerative changes detailed including an anterolisthesis L4 on.   PATIENT SURVEYS:  FOTO  65%   SCREENING FOR RED FLAGS: Bowel or bladder incontinence: No Spinal tumors: No Cauda equina syndrome: No Compression fracture: No Abdominal aneurysm: No   COGNITION: Overall cognitive status: Within functional limits for tasks assessed                   POSTURE: rounded shoulders and forward head   PALPATION: Slight tenderness to palpation along right ribs- no tender spots along paraspinals   LUMBAR ROM: WNL in all directions except in extension which was 50%        LE Measurements        Lower Extremity Right 11/21/2022 Left 11/21/2022    A/PROM MMT A/PROM MMT  Hip Flexion WFL 4 WFL 4  Hip Extension WFL 4- WFL 4-  Hip Abduction          Hip Adduction          Hip Internal rotation [facility]   [facility]    Hip External rotation [facility]   WFL    Knee Flexion   4-   4-  Knee Extension   4   4  Ankle Dorsiflexion   4+   4+  Ankle Plantarflexion          Ankle Inversion          Ankle Eversion           (Blank rows = not tested) * pain     LUMBAR SPECIAL TESTS:  Straight leg raise test: Negative and Slump test: Negative   FUNCTIONAL TESTS:  Bed mobility - turning over - slow movements (due to rib pain)     TODAY'S TREATMENT:                                                                                                                              DATE:     12/05/22  TherEx  Lumbar rotation stretch 5x5 seconds B  SKTC 5x5 seconds B DKTC 5x3 second holds  Supine figure 4 piriformis stretch 2x30 seconds B PPT x15 with 5 second holds  PPT + march x10  Curl-ups limited range (shoulder blades off mat table only, neutral/supported spine) x10 Lateral elbow to mat table + TA set x12 B Hip hikes x15 B Squats holding onto stair rails x10 cues for wt on heels Forward step downs x10 4 inch step BUE support  Forward step ups x12 B 4 inch step  Lateral step ups x12 B 4 inch step      PATIENT EDUCATION:  Education details: on current presentation, on HEP, on clinical outcomes score and POC Person educated: Patient Education method: Explanation, Demonstration, and Handouts Education comprehension: verbalized understanding     HOME EXERCISE PROGRAM: KCRAEWBY   ASSESSMENT:   CLINICAL IMPRESSION:  Pam arrives today feeling fairly well, not as sore as expected from PT and gym classes yesterday. Progressed exercises today as appropriate and tolerated with increased focus on core and LE strengthening.  Did not update HEP, would really like her to be consistent with existing  program before adding more to it. Will continue efforts.  OBJECTIVE IMPAIRMENTS: decreased activity tolerance, decreased ROM, decreased strength, postural dysfunction, and pain.    ACTIVITY LIMITATIONS: lifting, bending, squatting, and transfers   PARTICIPATION LIMITATIONS: cleaning and occupation   PERSONAL FACTORS: Age, Fitness, and 1 comorbidity: B TKA  are also affecting patient's functional outcome.    REHAB POTENTIAL: Good   CLINICAL DECISION MAKING: Stable/uncomplicated   EVALUATION COMPLEXITY: Low     GOALS: Goals reviewed with patient? yes   SHORT TERM GOALS: Target date: 01/02/2023  Patient will be independent in self management strategies to improve quality of life and functional outcomes. Baseline: New Program Goal status: INITIAL   2.  Patient will report at least 50% improvement in overall symptoms and/or function to demonstrate improved functional mobility Baseline: 0% better Goal status: INITIAL       LONG TERM GOALS: Target date: 02/13/2023    Patient will report at least 75% improvement in overall symptoms and/or function to demonstrate improved functional mobility Baseline: 0% better Goal status: INITIAL   2.  Patient will improve score on FOTO outcomes measure to projected score to demonstrate overall improved function and QOL Baseline: see above Goal status: INITIAL   3.  Patient will report performing regular exercises to help with strength and mobility Baseline:  not currently  Goal status: INITIAL   4.  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 Baseline: painful/unable Goal status: INITIAL     PLAN:   PT FREQUENCY: 1-2x/week for total of 12 visits over 12 weeks certification period   PT DURATION: 12 weeks   PLANNED INTERVENTIONS: Therapeutic exercises, Therapeutic activity, Neuromuscular re-education, Balance training, Gait training,  Patient/Family education, Self Care, Joint mobilization, Joint manipulation, Stair training, Orthotic/Fit training, DME instructions, Aquatic Therapy, Dry Needling, Electrical stimulation, Spinal manipulation, Spinal mobilization, Cryotherapy, Moist heat, Traction, Ultrasound, Ionotophoresis 4mg /ml Dexamethasone, Manual therapy, and Re-evaluation.   PLAN FOR NEXT SESSION: Focus on developing home exercise program patient can perform consistently anywhere as she travels for work (flight attendant), lower extremity and lumbar strength and mobility. Problem solve mechanics for functional tasks at work/on planes      Nedra Hai PT DPT PN2

## 2022-12-10 ENCOUNTER — Encounter: Payer: BC Managed Care – PPO | Admitting: Physical Therapy

## 2022-12-17 ENCOUNTER — Ambulatory Visit: Payer: BC Managed Care – PPO

## 2022-12-20 DIAGNOSIS — M810 Age-related osteoporosis without current pathological fracture: Secondary | ICD-10-CM | POA: Insufficient documentation

## 2022-12-20 DIAGNOSIS — Z6821 Body mass index (BMI) 21.0-21.9, adult: Secondary | ICD-10-CM | POA: Diagnosis not present

## 2022-12-20 DIAGNOSIS — Z01419 Encounter for gynecological examination (general) (routine) without abnormal findings: Secondary | ICD-10-CM | POA: Diagnosis not present

## 2022-12-23 ENCOUNTER — Encounter: Payer: Self-pay | Admitting: Physical Therapy

## 2022-12-23 ENCOUNTER — Ambulatory Visit (INDEPENDENT_AMBULATORY_CARE_PROVIDER_SITE_OTHER): Payer: BC Managed Care – PPO | Admitting: Physical Therapy

## 2022-12-23 ENCOUNTER — Ambulatory Visit
Admission: RE | Admit: 2022-12-23 | Discharge: 2022-12-23 | Disposition: A | Discharge status: Home / Self Care | Payer: BC Managed Care – PPO | Source: Ambulatory Visit | Attending: Physician Assistant | Admitting: Physician Assistant

## 2022-12-23 DIAGNOSIS — M5459 Other low back pain: Secondary | ICD-10-CM

## 2022-12-23 DIAGNOSIS — M6281 Muscle weakness (generalized): Secondary | ICD-10-CM | POA: Diagnosis not present

## 2022-12-23 DIAGNOSIS — Z1231 Encounter for screening mammogram for malignant neoplasm of breast: Secondary | ICD-10-CM | POA: Diagnosis not present

## 2022-12-23 DIAGNOSIS — Z0000 Encounter for general adult medical examination without abnormal findings: Secondary | ICD-10-CM

## 2022-12-23 NOTE — Therapy (Signed)
OUTPATIENT PHYSICAL THERAPY THORACOLUMBAR TREATMENT     Patient Name: Emma Swanson MRN: [id number] DOB:1941-05-28, 82 y.o., female Today's Date: 11/21/2022    PT End of Session - 12/23/22 1555     Visit Number 4    Number of Visits 12    Date for PT Re-Evaluation 02/13/23    Authorization Type BCBS VL 25, medicare    Authorization - Visit Number 4    Authorization - Number of Visits 25    Progress Note Due on Visit 10    PT Start Time 1556    PT Stop Time 1635    PT Time Calculation (min) 39 min    Activity Tolerance Patient tolerated treatment well    Behavior During Therapy WFL for tasks assessed/performed                    Past Medical History:  Diagnosis Date   Arthritis     Asthma     Cancer (HCC)      basal cells   Colon polyps     Family history of adverse reaction to anesthesia      see malignant hyperthermia tab    GERD (gastroesophageal reflux disease)     Hypertension     Hypothyroidism     Malignant hyperthermia      cousin's son- - malignant hyperthermia al most died  ,  patient's brother and his 2 sons tested positive for malignant hyperthermia   Palpitations     PONV (postoperative nausea and vomiting)     Vitamin D deficiency           Past Surgical History:  Procedure Laterality Date   AUGMENTATION MAMMAPLASTY Bilateral     bilateral arthroscopic knee surgery        bone spur removal        BREAST BIOPSY       CLAVICLE SURGERY Right     COLONOSCOPY       left ankle surgery - had pins        RESECTION DISTAL CLAVICAL Right 11/08/2021   thyroid surgery - age 89        TOTAL KNEE ARTHROPLASTY Right 11/25/2017    Procedure: RIGHT TOTAL KNEE ARTHROPLASTY;  Surgeon: Durene Romans, MD;  Location: [facility];  Service: Orthopedics;  Laterality: Right;  70 mins   TOTAL KNEE ARTHROPLASTY Left 04/16/2018    Procedure: LEFT TOTAL KNEE ARTHROPLASTY;  Surgeon: Durene Romans, MD;  Location: [facility];  Service: Orthopedics;  Laterality: Left;  90 mins    TOTAL SHOULDER ARTHROPLASTY Left 02/10/2020    Procedure: TOTAL SHOULDER ARTHROPLASTY;  Surgeon: Francena Hanly, MD;  Location: [facility];  Service: Orthopedics;  Laterality: Left;         Patient Active Problem List    Diagnosis Date Noted   Epistaxis 12/10/2021   Primary hypertension 12/10/2021   Insomnia 12/10/2021   Hepatic steatosis 11/01/2021   Closed fracture of right clavicle 04/16/2021   Impairment of balance 10/27/2020   Closed fracture of tibia and fibula 10/11/2020   Pain in left foot 10/11/2020   S/P reverse total shoulder arthroplasty, 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   Arthritis of hand 05/26/2018   S/P left TKA 04/16/2018   S/P right TKA 11/25/2017   Osteoarthritis of left glenohumeral joint 07/25/2017      PCP: Jarold Motto PA    REFERRING PROVIDER: Jarold Motto PA    REFERRING DIAG: M54.50 (ICD-10-CM) - Lumbar pain   Rationale for Evaluation and  Treatment: Rehabilitation   THERAPY DIAG:  Other low back pain   Muscle weakness (generalized)   ONSET DATE: 08/2022   SUBJECTIVE:                                                                                                                                                                                            SUBJECTIVE STATEMENT: 12/23/2022 States that her back is feeling about the same. States she gets some of her exercises done but not much some stretches.   Had that gym class last night on top of PT, feel good except for my wrists. Felt fine from what we did in PT last time too. Gym modified things for me too. Nothing really going on otherwise.   EVAL:States she fells 2 days ago - freak accident with current right wrist hematoma and slight soreness in right shoulder. Comes to PT for her low back. Currently seeing Chiropractor  for back and she gets  manipulation and DN.  States she goes to stretch zone 1-2x/week. History of clavicle break from fall from dehydration (occurred over a year ago). States her new pain  in her back started in February as she has been sitting more since her clavicle break.  No symptoms in her legs noted. 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 States she can't squat well as she has had both knees replaced.    PERTINENT HISTORY:  B TKA, left shoulder replacement, left ankle ORIF, HTN, poor balance on left foot.   PAIN:  Are you having pain? No: NPRS scale: 0/10 Pain location:  Pain description:  Aggravating factors:  Relieving factors:    PRECAUTIONS: None   WEIGHT BEARING RESTRICTIONS: No   FALLS:  Has patient fallen in last 6 months? Yes. Number of falls 1 a couple fays ago with bending over to free a cricket and hearing a plane overhead she looked up and slipped on her right side      OCCUPATION: flight attendant  - full time    PLOF: Independent, garden, scuba diving    PATIENT GOALS: to have less pain      OBJECTIVE:    DIAGNOSTIC FINDINGS:  Xray 10/21/22 LUMBAR SPINE - COMPLETE 4+ VIEW   COMPARISON:  None Available.   FINDINGS: No fracture.  No bone lesion.   Grade 1 anterolisthesis of L4 on L5. No other spondylolisthesis. Mild loss of disc height L2-L3, moderate loss of disc at L3-L4 and marked loss of disc height at L4-L5. Moderate loss of height at L5-S1.   Facet degenerative changes bilaterally from  L3-L4 through L5-S1. Mild levoscoliosis, apex at L3.   Scattered aortic atherosclerotic calcifications soft tissues otherwise unremarkable.   IMPRESSION: 1. No fracture or acute finding. 2. Degenerative changes detailed including an anterolisthesis L4 on.   PATIENT SURVEYS:  FOTO 65%   SCREENING FOR RED FLAGS: Bowel or bladder incontinence: No Spinal tumors: No Cauda equina syndrome: No Compression fracture: No Abdominal aneurysm: No   COGNITION: Overall cognitive status: Within functional limits for tasks assessed                   POSTURE: rounded shoulders and forward head    PALPATION: Slight tenderness to palpation along right ribs- no tender spots along paraspinals   LUMBAR ROM: WNL in all directions except in extension which was 50%        LE Measurements       Lower Extremity Right 11/21/2022 Left 11/21/2022    A/PROM MMT A/PROM MMT  Hip Flexion WFL 4 WFL 4  Hip Extension WFL 4- WFL 4-  Hip Abduction          Hip Adduction          Hip Internal rotation [facility]   [facility]    Hip External rotation [facility]   WFL    Knee Flexion   4-   4-  Knee Extension   4   4  Ankle Dorsiflexion   4+   4+  Ankle Plantarflexion          Ankle Inversion          Ankle Eversion           (Blank rows = not tested) * pain     LUMBAR SPECIAL TESTS:  Straight leg raise test: Negative and Slump test: Negative   FUNCTIONAL TESTS:  Bed mobility - turning over - slow movements (due to rib pain)     TODAY'S TREATMENT:                                                                                                                              DATE:    12/23/2022   TherEx  Thoracic stretch with strap 8 minutes total Shoulder ER at wall 5" holds 2. Minutes SHOULDER flexion up wall 3 minutes Shoulder protraction/retraction in field goal position at wall 3 minutes Foam roller down back at wall 3 minutes - not as effective Bridges 2x10 5" holds focus on pushing down into left leg  Thomas stretch x3 30" holds B- slight discomfort Standing hamstring curl x10 B 5" holds       PATIENT EDUCATION:  Education details: on HEP  Person educated: Patient Education method: Programmer, multimedia, Facilities manager, and Handouts Education comprehension: verbalized understanding     HOME EXERCISE PROGRAM: KCRAEWBY   ASSESSMENT:   CLINICAL IMPRESSION: Updated exercises for exercises that were tolerated best as well as most effective. Tolerated standing wall exercises very well as well as posture stretch with sheet/yoga strap around  upper back. Reduced tension noted end of session. Printed off  all new exercises to HEP, discontinued prone exercises as patient does not like to lay on stomach. Will continue with current POC as tolerated.   OBJECTIVE IMPAIRMENTS: decreased activity tolerance, decreased ROM, decreased strength, postural dysfunction, and pain.    ACTIVITY LIMITATIONS: lifting, bending, squatting, and transfers   PARTICIPATION LIMITATIONS: cleaning and occupation   PERSONAL FACTORS: Age, Fitness, and 1 comorbidity: B TKA  are also affecting patient's functional outcome.    REHAB POTENTIAL: Good   CLINICAL DECISION MAKING: Stable/uncomplicated   EVALUATION COMPLEXITY: Low     GOALS: Goals reviewed with patient? yes   SHORT TERM GOALS: Target date: 01/02/2023  Patient will be independent in self management strategies to improve quality of life and functional outcomes. Baseline: New Program Goal status: INITIAL   2.  Patient will report at least 50% improvement in overall symptoms and/or function to demonstrate improved functional mobility Baseline: 0% better Goal status: INITIAL       LONG TERM GOALS: Target date: 02/13/2023    Patient will report at least 75% improvement in overall symptoms and/or function to demonstrate improved functional mobility Baseline: 0% better Goal status: INITIAL   2.  Patient will improve score on FOTO outcomes measure to projected score to demonstrate overall improved function and QOL Baseline: see above Goal status: INITIAL   3.  Patient will report performing regular exercises to help with strength and mobility Baseline:  not currently  Goal status: INITIAL   4.  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 Baseline: painful/unable Goal status: INITIAL     PLAN:   PT FREQUENCY: 1-2x/week for total of 12 visits over 12 weeks certification period   PT DURATION: 12 weeks   PLANNED INTERVENTIONS: Therapeutic  exercises, Therapeutic activity, Neuromuscular re-education, Balance training, Gait training, Patient/Family education, Self Care, Joint mobilization, Joint manipulation, Stair training, Orthotic/Fit training, DME instructions, Aquatic Therapy, Dry Needling, Electrical stimulation, Spinal manipulation, Spinal mobilization, Cryotherapy, Moist heat, Traction, Ultrasound, Ionotophoresis 4mg /ml Dexamethasone, Manual therapy, and Re-evaluation.   PLAN FOR NEXT SESSION: Focus on developing home exercise program patient can perform consistently anywhere as she travels for work (flight attendant), lower extremity and lumbar strength and mobility. Problem solve mechanics for functional tasks at work/on planes    4:40 PM, 12/23/22 Tereasa Coop, DPT Physical Therapy with [facility]

## 2022-12-24 DIAGNOSIS — M13841 Other specified arthritis, right hand: Secondary | ICD-10-CM | POA: Diagnosis not present

## 2022-12-24 DIAGNOSIS — M13842 Other specified arthritis, left hand: Secondary | ICD-10-CM | POA: Diagnosis not present

## 2022-12-31 DIAGNOSIS — M50322 Other cervical disc degeneration at C5-C6 level: Secondary | ICD-10-CM | POA: Diagnosis not present

## 2022-12-31 DIAGNOSIS — M9902 Segmental and somatic dysfunction of thoracic region: Secondary | ICD-10-CM | POA: Diagnosis not present

## 2022-12-31 DIAGNOSIS — M4312 Spondylolisthesis, cervical region: Secondary | ICD-10-CM | POA: Diagnosis not present

## 2022-12-31 DIAGNOSIS — M9901 Segmental and somatic dysfunction of cervical region: Secondary | ICD-10-CM | POA: Diagnosis not present

## 2023-01-02 ENCOUNTER — Ambulatory Visit (INDEPENDENT_AMBULATORY_CARE_PROVIDER_SITE_OTHER): Payer: BC Managed Care – PPO | Admitting: Physical Therapy

## 2023-01-02 ENCOUNTER — Encounter: Payer: Self-pay | Admitting: Physical Therapy

## 2023-01-02 DIAGNOSIS — M6281 Muscle weakness (generalized): Secondary | ICD-10-CM

## 2023-01-02 DIAGNOSIS — M5459 Other low back pain: Secondary | ICD-10-CM | POA: Diagnosis not present

## 2023-01-02 NOTE — Therapy (Signed)
OUTPATIENT PHYSICAL THERAPY THORACOLUMBAR TREATMENT     Patient Name: Emma Swanson MRN: [id number] DOB:1941/03/20, 82 y.o., female Today's Date: 11/21/2022    PT End of Session - 01/02/23 1106     Visit Number 5    Number of Visits 12    Date for PT Re-Evaluation 02/13/23    Authorization Type BCBS VL 25, medicare    Authorization - Visit Number 5    Authorization - Number of Visits 25    Progress Note Due on Visit 10    PT Start Time 1106    PT Stop Time 1144    PT Time Calculation (min) 38 min    Activity Tolerance Patient tolerated treatment well    Behavior During Therapy WFL for tasks assessed/performed                    Past Medical History:  Diagnosis Date   Arthritis     Asthma     Cancer (HCC)      basal cells   Colon polyps     Family history of adverse reaction to anesthesia      see malignant hyperthermia tab    GERD (gastroesophageal reflux disease)     Hypertension     Hypothyroidism     Malignant hyperthermia      cousin's son- - malignant hyperthermia al most died  ,  patient's brother and his 2 sons tested positive for malignant hyperthermia   Palpitations     PONV (postoperative nausea and vomiting)     Vitamin D deficiency           Past Surgical History:  Procedure Laterality Date   AUGMENTATION MAMMAPLASTY Bilateral     bilateral arthroscopic knee surgery        bone spur removal        BREAST BIOPSY       CLAVICLE SURGERY Right     COLONOSCOPY       left ankle surgery - had pins        RESECTION DISTAL CLAVICAL Right 11/08/2021   thyroid surgery - age 72        TOTAL KNEE ARTHROPLASTY Right 11/25/2017    Procedure: RIGHT TOTAL KNEE ARTHROPLASTY;  Surgeon: Durene Romans, MD;  Location: [facility];  Service: Orthopedics;  Laterality: Right;  70 mins   TOTAL KNEE ARTHROPLASTY Left 04/16/2018    Procedure: LEFT TOTAL KNEE ARTHROPLASTY;  Surgeon: Durene Romans, MD;  Location: [facility];  Service: Orthopedics;  Laterality: Left;  90 mins    TOTAL SHOULDER ARTHROPLASTY Left 02/10/2020    Procedure: TOTAL SHOULDER ARTHROPLASTY;  Surgeon: Francena Hanly, MD;  Location: [facility];  Service: Orthopedics;  Laterality: Left;         Patient Active Problem List    Diagnosis Date Noted   Epistaxis 12/10/2021   Primary hypertension 12/10/2021   Insomnia 12/10/2021   Hepatic steatosis 11/01/2021   Closed fracture of right clavicle 04/16/2021   Impairment of balance 10/27/2020   Closed fracture of tibia and fibula 10/11/2020   Pain in left foot 10/11/2020   S/P reverse total shoulder arthroplasty, 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   Arthritis of hand 05/26/2018   S/P left TKA 04/16/2018   S/P right TKA 11/25/2017   Osteoarthritis of left glenohumeral joint 07/25/2017      PCP: Jarold Motto PA    REFERRING PROVIDER: Jarold Motto PA    REFERRING DIAG: M54.50 (ICD-10-CM) - Lumbar pain   Rationale for Evaluation and  Treatment: Rehabilitation   THERAPY DIAG:  Other low back pain   Muscle weakness (generalized)   ONSET DATE: 08/2022   SUBJECTIVE:                                                                                                                                                                                            SUBJECTIVE STATEMENT: 01/02/2023 States  that found long yoga strap and that really helps stretch it out. Actually easier to perform some in hotel room as she has a wall. States she doesn't have a lot of pain right now. Still has lower back pain but feels it is better.    EVAL:States she fells 2 days ago - freak accident with current right wrist hematoma and slight soreness in right shoulder. Comes to PT for her low back. Currently seeing Chiropractor  for back and she gets  manipulation and DN.  States she goes to stretch zone 1-2x/week. History of clavicle break from fall from dehydration (occurred over a year ago). States her new pain in her back started in February as she has been sitting more since her  clavicle break.  No symptoms in her legs noted. 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 States she can't squat well as she has had both knees replaced.    PERTINENT HISTORY:  B TKA, left shoulder replacement, left ankle ORIF, HTN, poor balance on left foot.   PAIN:  Are you having pain? No: NPRS scale: 0/10 Pain location:  Pain description:  Aggravating factors:  Relieving factors:    PRECAUTIONS: None   WEIGHT BEARING RESTRICTIONS: No   FALLS:  Has patient fallen in last 6 months? Yes. Number of falls 1 a couple fays ago with bending over to free a cricket and hearing a plane overhead she looked up and slipped on her right side      OCCUPATION: flight attendant  - full time    PLOF: Independent, garden, scuba diving    PATIENT GOALS: to have less pain      OBJECTIVE:    DIAGNOSTIC FINDINGS:  Xray 10/21/22 LUMBAR SPINE - COMPLETE 4+ VIEW   COMPARISON:  None Available.   FINDINGS: No fracture.  No bone lesion.   Grade 1 anterolisthesis of L4 on L5. No other spondylolisthesis. Mild loss of disc height L2-L3, moderate loss of disc at L3-L4 and marked loss of disc height at L4-L5. Moderate loss of height at L5-S1.   Facet degenerative changes bilaterally from L3-L4 through L5-S1. Mild levoscoliosis, apex at L3.   Scattered aortic atherosclerotic calcifications  soft tissues otherwise unremarkable.   IMPRESSION: 1. No fracture or acute finding. 2. Degenerative changes detailed including an anterolisthesis L4 on.   PATIENT SURVEYS:  FOTO 65%   SCREENING FOR RED FLAGS: Bowel or bladder incontinence: No Spinal tumors: No Cauda equina syndrome: No Compression fracture: No Abdominal aneurysm: No   COGNITION: Overall cognitive status: Within functional limits for tasks assessed                   POSTURE: rounded shoulders and forward head   PALPATION: Slight tenderness to palpation along right ribs- no tender  spots along paraspinals   LUMBAR ROM: WNL in all directions except in extension which was 50%        LE Measurements       Lower Extremity Right 11/21/2022 Left 11/21/2022    A/PROM MMT A/PROM MMT  Hip Flexion WFL 4 WFL 4  Hip Extension WFL 4- WFL 4-  Hip Abduction          Hip Adduction          Hip Internal rotation [facility]   [facility]    Hip External rotation [facility]   WFL    Knee Flexion   4-   4-  Knee Extension   4   4  Ankle Dorsiflexion   4+   4+  Ankle Plantarflexion          Ankle Inversion          Ankle Eversion           (Blank rows = not tested) * pain     LUMBAR SPECIAL TESTS:  Straight leg raise test: Negative and Slump test: Negative   FUNCTIONAL TESTS:  Bed mobility - turning over - slow movements (due to rib pain)     TODAY'S TREATMENT:                                                                                                                              DATE:    01/02/2023 TherEx Hip flexor stretch B x10 10" holds  Standing hamstring curl 2x10 B 5" holds Calf raises DL Z61 5" holds DF W96 5" holds one at a time - back at the wall. Finger abd stretch x15 5-10" holds B Prayer and reverse prayer stretch x10 15" holds B  Towel roll down back at wall shoulder ER/abd 2x15 B        PATIENT EDUCATION:  Education details: on HEP, on cramping causes and strategies to minimize cramping in muscles -  Person educated: Patient Education method: Explanation, Demonstration, and Handouts Education comprehension: verbalized understanding     HOME EXERCISE PROGRAM: KCRAEWBY   ASSESSMENT:   CLINICAL IMPRESSION: Continued progressing exercises as tolerated. Focused on wrist/hand exercises as well as LE exercises secondary to complaints of increased cramping in these areas. Initially cramping noted with exercises but this reduced with practice/repetition. Added new exercises to HEP. No pain noted end  of session. Will continue with current POC as tolerated.    OBJECTIVE IMPAIRMENTS: decreased activity tolerance, decreased ROM, decreased strength, postural dysfunction, and pain.    ACTIVITY LIMITATIONS: lifting, bending, squatting, and transfers   PARTICIPATION LIMITATIONS: cleaning and occupation   PERSONAL FACTORS: Age, Fitness, and 1 comorbidity: B TKA  are also affecting patient's functional outcome.    REHAB POTENTIAL: Good   CLINICAL DECISION MAKING: Stable/uncomplicated   EVALUATION COMPLEXITY: Low     GOALS: Goals reviewed with patient? yes   SHORT TERM GOALS: Target date: 01/02/2023  Patient will be independent in self management strategies to improve quality of life and functional outcomes. Baseline: New Program Goal status: INITIAL   2.  Patient will report at least 50% improvement in overall symptoms and/or function to demonstrate improved functional mobility Baseline: 0% better Goal status: INITIAL       LONG TERM GOALS: Target date: 02/13/2023    Patient will report at least 75% improvement in overall symptoms and/or function to demonstrate improved functional mobility Baseline: 0% better Goal status: INITIAL   2.  Patient will improve score on FOTO outcomes measure to projected score to demonstrate overall improved function and QOL Baseline: see above Goal status: INITIAL   3.  Patient will report performing regular exercises to help with strength and mobility Baseline:  not currently  Goal status: INITIAL   4.  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 Baseline: painful/unable Goal status: INITIAL     PLAN:   PT FREQUENCY: 1-2x/week for total of 12 visits over 12 weeks certification period   PT DURATION: 12 weeks   PLANNED INTERVENTIONS: Therapeutic exercises, Therapeutic activity, Neuromuscular re-education, Balance training, Gait training, Patient/Family education, Self Care, Joint mobilization,  Joint manipulation, Stair training, Orthotic/Fit training, DME instructions, Aquatic Therapy, Dry Needling, Electrical stimulation, Spinal manipulation, Spinal mobilization, Cryotherapy, Moist heat, Traction, Ultrasound, Ionotophoresis 4mg /ml Dexamethasone, Manual therapy, and Re-evaluation.   PLAN FOR NEXT SESSION: PN Focus on developing home exercise program patient can perform consistently anywhere as she travels for work (flight attendant), lower extremity and lumbar strength and mobility. Problem solve mechanics for functional tasks at work/on planes    12:24 PM, 01/02/23 Tereasa Coop, DPT Physical Therapy with [facility]

## 2023-01-09 ENCOUNTER — Encounter: Payer: Self-pay | Admitting: Physical Therapy

## 2023-01-09 ENCOUNTER — Ambulatory Visit (INDEPENDENT_AMBULATORY_CARE_PROVIDER_SITE_OTHER): Payer: BC Managed Care – PPO | Admitting: Physical Therapy

## 2023-01-09 ENCOUNTER — Ambulatory Visit (INDEPENDENT_AMBULATORY_CARE_PROVIDER_SITE_OTHER): Payer: BC Managed Care – PPO | Admitting: Family

## 2023-01-09 VITALS — BP 130/86 | HR 77 | Temp 98.0°F | Ht 67.0 in | Wt 135.2 lb

## 2023-01-09 DIAGNOSIS — M6281 Muscle weakness (generalized): Secondary | ICD-10-CM

## 2023-01-09 DIAGNOSIS — M4312 Spondylolisthesis, cervical region: Secondary | ICD-10-CM | POA: Diagnosis not present

## 2023-01-09 DIAGNOSIS — M5459 Other low back pain: Secondary | ICD-10-CM

## 2023-01-09 DIAGNOSIS — M50322 Other cervical disc degeneration at C5-C6 level: Secondary | ICD-10-CM | POA: Diagnosis not present

## 2023-01-09 DIAGNOSIS — M9901 Segmental and somatic dysfunction of cervical region: Secondary | ICD-10-CM | POA: Diagnosis not present

## 2023-01-09 DIAGNOSIS — R053 Chronic cough: Secondary | ICD-10-CM

## 2023-01-09 DIAGNOSIS — M9902 Segmental and somatic dysfunction of thoracic region: Secondary | ICD-10-CM | POA: Diagnosis not present

## 2023-01-09 MED ORDER — METHYLPREDNISOLONE ACETATE 80 MG/ML IJ SUSP
80.0000 mg | Freq: Once | INTRAMUSCULAR | Status: AC
  Administered 2023-01-09: 80 mg via INTRAMUSCULAR

## 2023-01-09 NOTE — Progress Notes (Signed)
Patient ID: Emma Swanson, female    DOB: 05-20-1941, 82 y.o.   MRN: [id number]  Chief Complaint  Patient presents with   Cough    Pt c/o cough for a month. Has tried cough drops, cough syrups and medications from [place] which did help slightly.     HPI:      Persistent cough:   reports a dry cough mostly, occasionally gets up whitish phlegm. 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 She states she coughs at anytime during the day and evening, no worse when lying down at night, feels a tickle in her throat and starts coughing. Denies ever having any sinus sx, no sore throat other than scratchy from coughing. Denies any heartburn or reflux sx, no asthma hx, no SOB or chest tightness. She has been taking OTC cough suppressant pills, lozenges, and syrups without relief.   Assessment & Plan:  1. Persistent cough for 3 weeks or longer - given steroid injection in office. advised on use & SE, pt has dexamethasone allergy listed, but does not remember having an allergy. The last time she received it was in 03/2018 after knee surgery and it was given thru her IV. She remembers having an Oxycodone allergy but not to a steroid. And states she has had cortisone injections in her hands and shoulder since that time and has not had any reactions. Advised to increase water intake, continue cough syrup and lozenges prn, but stop the antihistamines and cough suppressant pills she has been taking. Call back if sx are still not resolving after next week.  - methylPREDNISolone acetate (DEPO-MEDROL) injection 80 mg  Subjective:    Outpatient Medications Prior to Visit  Medication Sig Dispense Refill   alendronate (FOSAMAX) 70 MG tablet Take 1 tablet (70 mg total) by mouth every 7 (seven) days. Take with a full glass of water on an empty stomach. 4 tablet 11   cholecalciferol (VITAMIN D3) 25 MCG (1000 UNIT) tablet Take 5,000 Units by mouth daily.     clobetasol ointment (TEMOVATE) 0.05 % Apply  topically 2 (two) times daily.     ipratropium (ATROVENT) 0.06 % nasal spray Place 2 sprays into both nostrils 2 (two) times daily. 45 mL 2   lisinopril-hydrochlorothiazide (PRINZIDE,ZESTORETIC) 10-12.5 MG tablet Take 1 tablet by mouth daily.     loperamide (IMODIUM A-D) 2 MG tablet Take 1 tablet (2 mg total) by mouth in the morning. Increase to twice a day if needed 30 tablet 0   Multiple Vitamin (MULTIVITAMIN WITH MINERALS) TABS tablet Take 1 tablet by mouth daily.     Oxymetazoline HCl (NASAL SPRAY NA) Place into the nose. Daily for allergy     Peppermint Oil (IBGARD) 90 MG CPCR prn     triamcinolone cream (KENALOG) 0.1 % Apply 1 Application topically 2 (two) times daily. 30 g 0   zolpidem (AMBIEN) 10 MG tablet TAKE 1 TABLET BY MOUTH EVERYDAY AT BEDTIME 30 tablet 5   No facility-administered medications prior to visit.   Past Medical History:  Diagnosis Date   Arthritis    Asthma    Cancer (HCC)    basal cells   Colon polyps    Family history of adverse reaction to anesthesia    see malignant hyperthermia tab    GERD (gastroesophageal reflux disease)    Hypertension    Hypothyroidism    Malignant hyperthermia    cousin's son- - malignant hyperthermia al most died  ,  patient's  brother and his 2 sons tested positive for malignant hyperthermia   Palpitations    PONV (postoperative nausea and vomiting)    Vitamin D deficiency    Past Surgical History:  Procedure Laterality Date   AUGMENTATION MAMMAPLASTY Bilateral    bilateral arthroscopic knee surgery      bone spur removal      BREAST BIOPSY     CLAVICLE SURGERY Right    COLONOSCOPY     left ankle surgery - had pins      RESECTION DISTAL CLAVICAL Right 11/08/2021   thyroid surgery - age 41      TOTAL KNEE ARTHROPLASTY Right 11/25/2017   Procedure: RIGHT TOTAL KNEE ARTHROPLASTY;  Surgeon: Durene Romans, MD;  Location: [facility];  Service: Orthopedics;  Laterality: Right;  70 mins   TOTAL KNEE ARTHROPLASTY Left 04/16/2018    Procedure: LEFT TOTAL KNEE ARTHROPLASTY;  Surgeon: Durene Romans, MD;  Location: [facility];  Service: Orthopedics;  Laterality: Left;  90 mins   TOTAL SHOULDER ARTHROPLASTY Left 02/10/2020   Procedure: TOTAL SHOULDER ARTHROPLASTY;  Surgeon: Francena Hanly, MD;  Location: [facility];  Service: Orthopedics;  Laterality: Left;    Allergies  Allergen Reactions   Dexamethasone    Other     general anesthesia - pt's family has history of malignant hyperthermia   Oxycodone     Hallucinations    Penicillins     Childhood reaction Has patient had a PCN reaction causing immediate rash, facial/tongue/throat swelling, SOB or lightheadedness with hypotension: Unknown Has patient had a PCN reaction causing severe rash involving mucus membranes or skin necrosis: Unknown Has patient had a PCN reaction that required hospitalization: Unknown Has patient had a PCN reaction occurring within the last 10 years: No  Tolerated ancef 02/10/2020       Objective:    Physical Exam Vitals and nursing note reviewed.  Constitutional:      Appearance: Normal appearance.  Cardiovascular:     Rate and Rhythm: Normal rate and regular rhythm.  Pulmonary:     Effort: Pulmonary effort is normal.     Breath sounds: Normal breath sounds.  Musculoskeletal:        General: Normal range of motion.  Skin:    General: Skin is warm and dry.  Neurological:     Mental Status: She is alert.  Psychiatric:        Mood and Affect: Mood normal.        Behavior: Behavior normal.   BP 130/86   Pulse 77   Temp 98 F (36.7 C) (Temporal)   Ht 5\' 7"  (1.702 m)   Wt 135 lb 3.2 oz (61.3 kg)   SpO2 100%   BMI 21.18 kg/m  Wt Readings from Last 3 Encounters:  01/09/23 135 lb 3.2 oz (61.3 kg)  10/17/22 138 lb (62.6 kg)  07/04/22 138 lb 12.8 oz (63 kg)       Dulce Sellar, NP

## 2023-01-09 NOTE — Therapy (Signed)
OUTPATIENT PHYSICAL THERAPY THORACOLUMBAR TREATMENT     Patient Name: Emma Swanson MRN: [id number] DOB:Jul 18, 1941, 82 y.o., female Today's Date: 11/21/2022    PT End of Session - 01/09/23 1103     Visit Number 6    Number of Visits 12    Date for PT Re-Evaluation 02/13/23    Authorization Type BCBS VL 25, medicare    Authorization - Visit Number 6    Authorization - Number of Visits 25    Progress Note Due on Visit 10    PT Start Time 1104    PT Stop Time 1142    PT Time Calculation (min) 38 min    Activity Tolerance Patient tolerated treatment well    Behavior During Therapy WFL for tasks assessed/performed                    Past Medical History:  Diagnosis Date   Arthritis     Asthma     Cancer (HCC)      basal cells   Colon polyps     Family history of adverse reaction to anesthesia      see malignant hyperthermia tab    GERD (gastroesophageal reflux disease)     Hypertension     Hypothyroidism     Malignant hyperthermia      cousin's son- - malignant hyperthermia al most died  ,  patient's brother and his 2 sons tested positive for malignant hyperthermia   Palpitations     PONV (postoperative nausea and vomiting)     Vitamin D deficiency           Past Surgical History:  Procedure Laterality Date   AUGMENTATION MAMMAPLASTY Bilateral     bilateral arthroscopic knee surgery        bone spur removal        BREAST BIOPSY       CLAVICLE SURGERY Right     COLONOSCOPY       left ankle surgery - had pins        RESECTION DISTAL CLAVICAL Right 11/08/2021   thyroid surgery - age 54        TOTAL KNEE ARTHROPLASTY Right 11/25/2017    Procedure: RIGHT TOTAL KNEE ARTHROPLASTY;  Surgeon: Durene Romans, MD;  Location: [facility];  Service: Orthopedics;  Laterality: Right;  70 mins   TOTAL KNEE ARTHROPLASTY Left 04/16/2018    Procedure: LEFT TOTAL KNEE ARTHROPLASTY;  Surgeon: Durene Romans, MD;  Location: [facility];  Service: Orthopedics;  Laterality: Left;  90 mins    TOTAL SHOULDER ARTHROPLASTY Left 02/10/2020    Procedure: TOTAL SHOULDER ARTHROPLASTY;  Surgeon: Francena Hanly, MD;  Location: [facility];  Service: Orthopedics;  Laterality: Left;         Patient Active Problem List    Diagnosis Date Noted   Epistaxis 12/10/2021   Primary hypertension 12/10/2021   Insomnia 12/10/2021   Hepatic steatosis 11/01/2021   Closed fracture of right clavicle 04/16/2021   Impairment of balance 10/27/2020   Closed fracture of tibia and fibula 10/11/2020   Pain in left foot 10/11/2020   S/P reverse total shoulder arthroplasty, 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   Arthritis of hand 05/26/2018   S/P left TKA 04/16/2018   S/P right TKA 11/25/2017   Osteoarthritis of left glenohumeral joint 07/25/2017      PCP: Jarold Motto PA    REFERRING PROVIDER: Jarold Motto PA    REFERRING DIAG: M54.50 (ICD-10-CM) - Lumbar pain   Rationale for Evaluation and  Treatment: Rehabilitation   THERAPY DIAG:  Other low back pain   Muscle weakness (generalized)   ONSET DATE: 08/2022   SUBJECTIVE:                                                                                                                                                                                            SUBJECTIVE STATEMENT: 01/09/2023 States she has been doing pretty good with her back. States she has had a sore throat and is seeing a PCP today. States she has been doing her exercises without difficulties. States she feels 75-80% better since the start of PT it is no longer a constant pain.   EVAL:States she fells 2 days ago - freak accident with current right wrist hematoma and slight soreness in right shoulder. Comes to PT for her low back. Currently seeing Chiropractor  for back and she gets  manipulation and DN.  States she [facility]-2[facility]. History of clavicle break from fall from dehydration (occurred over a year ago). States her new pain in her back started in February as she has been  sitting more since her clavicle break.  No symptoms in her legs noted. 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 States she can't squat well as she has had both knees replaced.    PERTINENT HISTORY:  B TKA, left shoulder replacement, left ankle ORIF, HTN, poor balance on left foot.   PAIN:  Are you having pain? No: NPRS scale: 0/10 Pain location:  Pain description:  Aggravating factors:  Relieving factors:    PRECAUTIONS: None   WEIGHT BEARING RESTRICTIONS: No   FALLS:  Has patient fallen in last 6 months? Yes. Number of falls 1 a couple fays ago with bending over to free a cricket and hearing a plane overhead she looked up and slipped on her right side      OCCUPATION: flight attendant  - full time    PLOF: Independent, garden, scuba diving    PATIENT GOALS: to have less pain      OBJECTIVE:    DIAGNOSTIC FINDINGS:  Xray 10/21/22 LUMBAR SPINE - COMPLETE 4+ VIEW   COMPARISON:  None Available.   FINDINGS: No fracture.  No bone lesion.   Grade 1 anterolisthesis of L4 on L5. No other spondylolisthesis. Mild loss of disc height L2-L3, moderate loss of disc at L3-L4 and marked loss of disc height at L4-L5. Moderate loss of height at L5-S1.   Facet degenerative changes bilaterally from L3-L4 through L5-S1. Mild levoscoliosis, apex at L3.   Scattered aortic atherosclerotic  calcifications soft tissues otherwise unremarkable.   IMPRESSION: 1. No fracture or acute finding. 2. Degenerative changes detailed including an anterolisthesis L4 on.   PATIENT SURVEYS:  Eval FOTO 65%, 01/09/23 FOTO 69%   SCREENING FOR RED FLAGS: Bowel or bladder incontinence: No Spinal tumors: No Cauda equina syndrome: No Compression fracture: No Abdominal aneurysm: No   COGNITION: Overall cognitive status: Within functional limits for tasks assessed                   POSTURE: rounded shoulders and forward head   PALPATION: Slight  tenderness to palpation along right ribs- no tender spots along paraspinals   LUMBAR ROM: WNL in all directions except in extension which was 50%        LE Measurements       Lower Extremity Right 01/09/23 Left 01/09/23    A/PROM MMT A/PROM MMT  Hip Flexion WFL 4+ WFL 4+  Hip Extension WFL 4- WFL 4-  Hip Abduction          Hip Adduction          Hip Internal rotation [facility]   [facility]    Hip External rotation [facility]   WFL    Knee Flexion   4   4  Knee Extension   4+   4+  Ankle Dorsiflexion   4+   4+  Ankle Plantarflexion          Ankle Inversion          Ankle Eversion           (Blank rows = not tested) * pain      FUNCTIONAL TESTS:  Bed mobility - turning over - slow movements (due to rib pain)     TODAY'S TREATMENT:                                                                                                                              DATE:    01/09/2023 TherEx Seated CLAMSHELL X15, THEN RTB 3X5 10" HOLDS Squats at counter x15 STS - focus on form knees wide -10 minutes 24 inch height Objective measurements updated  Therapeutic activity Steps 4" height -none, 1, both railings with focus on knee not collapsing into valgus and foot not going into excessive Eversion 15 minutes       PATIENT EDUCATION:  Education details: on HEP, on progress made in regards to Foto score, objective measurements, goals Person educated: Patient Education method: Explanation, Demonstration, and Handouts Education comprehension: verbalized understanding     HOME EXERCISE PROGRAM: [email or web address]   ASSESSMENT:   CLINICAL IMPRESSION: Objective measurements updated on this date.  Discussed progress as well as plan moving forward.  Patient has met 1 of 2 short-term goals and 2 of 4 long-term goals.  With biggest challenge being adherence to home exercise program regularly as she travels frequently for work and pleasure.  Overall patient is doing significantly well and discussed potential  discharge next session pending patient presentation.  Will continue with current plan of care as tolerated.  OBJECTIVE IMPAIRMENTS: decreased activity tolerance, decreased ROM, decreased strength, postural dysfunction, and pain.    ACTIVITY LIMITATIONS: lifting, bending, squatting, and transfers   PARTICIPATION LIMITATIONS: cleaning and occupation   PERSONAL FACTORS: Age, Fitness, and 1 comorbidity: B TKA  are also affecting patient's functional outcome.    REHAB POTENTIAL: Good   CLINICAL DECISION MAKING: Stable/uncomplicated   EVALUATION COMPLEXITY: Low     GOALS: Goals reviewed with patient? yes   SHORT TERM GOALS: Target date: 01/02/2023  Patient will be independent in self management strategies to improve quality of life and functional outcomes. Baseline: New Program Goal status: PROGRESSING   2.  Patient will report at least 50% improvement in overall symptoms and/or function to demonstrate improved functional mobility Baseline: 0% better Goal status: MET       LONG TERM GOALS: Target date: 02/13/2023    Patient will report at least 75% improvement in overall symptoms and/or function to demonstrate improved functional mobility Baseline: 0% better Goal status: MET   2.  Patient will improve score on FOTO outcomes measure to projected score to demonstrate overall improved function and QOL Baseline: see above Goal status: MET   3.  Patient will report performing regular exercises to help with strength and mobility Baseline:  not currently  Goal status: PROGRESSING    4.  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 Baseline: painful/unable Goal status: PROGRESSING      PLAN:   PT FREQUENCY: 1-2x/week for total of 12 visits over 12 weeks certification period   PT DURATION: 12 weeks   PLANNED INTERVENTIONS: Therapeutic exercises, Therapeutic activity,  Neuromuscular re-education, Balance training, Gait training, Patient/Family education, Self Care, Joint mobilization, Joint manipulation, Stair training, Orthotic/Fit training, DME instructions, Aquatic Therapy, Dry Needling, Electrical stimulation, Spinal manipulation, Spinal mobilization, Cryotherapy, Moist heat, Traction, Ultrasound, Ionotophoresis 4mg /ml Dexamethasone, Manual therapy, and Re-evaluation.   PLAN FOR NEXT SESSION: Review entire home exercise program as well as stairs and squats.  Potential discharge pending patient presentation and plan moving forward   1:04 PM, 01/09/23 Tereasa Coop, DPT Physical Therapy with [facility]

## 2023-01-10 ENCOUNTER — Telehealth: Payer: Self-pay

## 2023-01-10 NOTE — Telephone Encounter (Signed)
I called and spoke with pt, no reactions after injection. No questions or concerns at this time.

## 2023-01-10 NOTE — Telephone Encounter (Signed)
-----   Message from Dulce Sellar, NP sent at 01/09/2023  8:51 PM EDT ----- Regarding: call patient Please call Emma Swanson and ask if she has had any reaction after receiving the steroid injection today? Any rash, nausea, etc.? If none, let's remove the dexamethasone allergy from her profile, as I could see no reference to it the last time she received in 2019. Thx.

## 2023-01-16 ENCOUNTER — Ambulatory Visit (INDEPENDENT_AMBULATORY_CARE_PROVIDER_SITE_OTHER): Payer: BC Managed Care – PPO | Admitting: Physical Therapy

## 2023-01-16 ENCOUNTER — Encounter: Payer: Self-pay | Admitting: Physical Therapy

## 2023-01-16 DIAGNOSIS — M5459 Other low back pain: Secondary | ICD-10-CM

## 2023-01-16 DIAGNOSIS — M50322 Other cervical disc degeneration at C5-C6 level: Secondary | ICD-10-CM | POA: Diagnosis not present

## 2023-01-16 DIAGNOSIS — M4312 Spondylolisthesis, cervical region: Secondary | ICD-10-CM | POA: Diagnosis not present

## 2023-01-16 DIAGNOSIS — M6281 Muscle weakness (generalized): Secondary | ICD-10-CM | POA: Diagnosis not present

## 2023-01-16 DIAGNOSIS — M9901 Segmental and somatic dysfunction of cervical region: Secondary | ICD-10-CM | POA: Diagnosis not present

## 2023-01-16 DIAGNOSIS — M9902 Segmental and somatic dysfunction of thoracic region: Secondary | ICD-10-CM | POA: Diagnosis not present

## 2023-01-16 NOTE — Therapy (Signed)
OUTPATIENT PHYSICAL THERAPY THORACOLUMBAR TREATMENT  PHYSICAL THERAPY DISCHARGE SUMMARY  Visits from Start of Care: 7  Current functional level related to goals / functional outcomes: All but 2 goals met   Remaining deficits: strength   Education / Equipment: See below   Patient agrees to discharge. Patient goals were partially met. Patient is being discharged due to being pleased with the current functional level.    Patient Name: Emma Swanson MRN: [id number] DOB:10-18-1940, 82 y.o., female Today's Date: 11/21/2022    PT End of Session - 01/16/23 1105     Visit Number 7    Number of Visits 12    Date for PT Re-Evaluation 02/13/23    Authorization Type BCBS VL 25, medicare    Authorization - Visit Number 7    Authorization - Number of Visits 25    Progress Note Due on Visit 10    PT Start Time 1105    PT Stop Time 1143    PT Time Calculation (min) 38 min    Activity Tolerance Patient tolerated treatment well    Behavior During Therapy WFL for tasks assessed/performed                    Past Medical History:  Diagnosis Date   Arthritis     Asthma     Cancer (HCC)      basal cells   Colon polyps     Family history of adverse reaction to anesthesia      see malignant hyperthermia tab    GERD (gastroesophageal reflux disease)     Hypertension     Hypothyroidism     Malignant hyperthermia      cousin's son- - malignant hyperthermia al most died  ,  patient's brother and his 2 sons tested positive for malignant hyperthermia   Palpitations     PONV (postoperative nausea and vomiting)     Vitamin D deficiency           Past Surgical History:  Procedure Laterality Date   AUGMENTATION MAMMAPLASTY Bilateral     bilateral arthroscopic knee surgery        bone spur removal        BREAST BIOPSY       CLAVICLE SURGERY Right     COLONOSCOPY       left ankle surgery - had pins        RESECTION DISTAL CLAVICAL Right 11/08/2021   thyroid surgery - age 82         TOTAL KNEE ARTHROPLASTY Right 11/25/2017    Procedure: RIGHT TOTAL KNEE ARTHROPLASTY;  Surgeon: Durene Romans, MD;  Location: [facility];  Service: Orthopedics;  Laterality: Right;  70 mins   TOTAL KNEE ARTHROPLASTY Left 04/16/2018    Procedure: LEFT TOTAL KNEE ARTHROPLASTY;  Surgeon: Durene Romans, MD;  Location: [facility];  Service: Orthopedics;  Laterality: Left;  90 mins   TOTAL SHOULDER ARTHROPLASTY Left 02/10/2020    Procedure: TOTAL SHOULDER ARTHROPLASTY;  Surgeon: Francena Hanly, MD;  Location: [facility];  Service: Orthopedics;  Laterality: Left;         Patient Active Problem List    Diagnosis Date Noted   Epistaxis 12/10/2021   Primary hypertension 12/10/2021   Insomnia 12/10/2021   Hepatic steatosis 11/01/2021   Closed fracture of right clavicle 04/16/2021   Impairment of balance 10/27/2020   Closed fracture of tibia and fibula 10/11/2020   Pain in left foot 10/11/2020   S/P reverse total shoulder  arthroplasty, 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   Arthritis of hand 05/26/2018   S/P left TKA 04/16/2018   S/P right TKA 11/25/2017   Osteoarthritis of left glenohumeral joint 07/25/2017      PCP: Jarold Motto PA    REFERRING PROVIDER: Jarold Motto PA    REFERRING DIAG: M54.50 (ICD-10-CM) - Lumbar pain   Rationale for Evaluation and Treatment: Rehabilitation   THERAPY DIAG:  Other low back pain   Muscle weakness (generalized)   ONSET DATE: 08/2022   SUBJECTIVE:                                                                                                                                                                                            SUBJECTIVE STATEMENT: 01/16/2023 States that she is been tired as she got in late from work. States lifting her bag overhead still bothers her.    EVAL:States she fells 2 days ago - freak accident with current right wrist hematoma and slight soreness in right shoulder. Comes to PT for her low back. Currently seeing Chiropractor  for back and  she gets  manipulation and DN.  States she goes to stretch zone 1-2x/week. History of clavicle break from fall from dehydration (occurred over a year ago). States her new pain in her back started in February as she has been sitting more since her clavicle break.  No symptoms in her legs noted. 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 States she can't squat well as she has had both knees replaced.    PERTINENT HISTORY:  B TKA, left shoulder replacement, left ankle ORIF, HTN, poor balance on left foot.   PAIN:  Are you having pain? No: NPRS scale: 0/10 Pain location:  Pain description:  Aggravating factors:  Relieving factors:    PRECAUTIONS: None   WEIGHT BEARING RESTRICTIONS: No   FALLS:  Has patient fallen in last 6 months? Yes. Number of falls 1 a couple fays ago with bending over to free a cricket and hearing a plane overhead she looked up and slipped on her right side      OCCUPATION: flight attendant  - full time    PLOF: Independent, garden, scuba diving    PATIENT GOALS: to have less pain      OBJECTIVE:    DIAGNOSTIC FINDINGS:  Xray 10/21/22 LUMBAR SPINE - COMPLETE 4+ VIEW   COMPARISON:  None Available.   FINDINGS: No fracture.  No bone lesion.   Grade 1 anterolisthesis of L4 on L5. No other spondylolisthesis. Mild loss of disc height L2-L3, moderate loss of disc at  L3-L4 and marked loss of disc height at L4-L5. Moderate loss of height at L5-S1.   Facet degenerative changes bilaterally from L3-L4 through L5-S1. Mild levoscoliosis, apex at L3.   Scattered aortic atherosclerotic calcifications soft tissues otherwise unremarkable.   IMPRESSION: 1. No fracture or acute finding. 2. Degenerative changes detailed including an anterolisthesis L4 on.   PATIENT SURVEYS:  Eval FOTO 65%, 01/09/23 FOTO 69%   SCREENING FOR RED FLAGS: Bowel or bladder incontinence: No Spinal tumors: No Cauda equina syndrome:  No Compression fracture: No Abdominal aneurysm: No   COGNITION: Overall cognitive status: Within functional limits for tasks assessed                   POSTURE: rounded shoulders and forward head   PALPATION: Slight tenderness to palpation along right ribs- no tender spots along paraspinals   LUMBAR ROM: WNL in all directions except in extension which was 50%        LE Measurements       Lower Extremity Right 01/09/23 Left 01/09/23    A/PROM MMT A/PROM MMT  Hip Flexion WFL 4+ WFL 4+  Hip Extension WFL 4- WFL 4-  Hip Abduction          Hip Adduction          Hip Internal rotation [facility]   [facility]    Hip External rotation [facility]   WFL    Knee Flexion   4   4  Knee Extension   4+   4+  Ankle Dorsiflexion   4+   4+  Ankle Plantarflexion          Ankle Inversion          Ankle Eversion           (Blank rows = not tested) * pain      FUNCTIONAL TESTS:  Bed mobility - turning over - slow movements (due to rib pain)     TODAY'S TREATMENT:                                                                                                                              DATE:    01/16/2023 TherEx STS with squat holds up to 20" x15 total Squats at counter 4x5 5" holds   Therapeutic activity Steps 4" height--> 6" stept -1 railing with focus on knee not collapsing into valgus and foot not going into excessive Eversion 15 minutes B       PATIENT EDUCATION:  Education details: on HEP, on importance of performing squats with proper form Person educated: Patient Education method: Explanation, Demonstration, and Handouts Education comprehension: verbalized understanding     HOME EXERCISE PROGRAM: [email or web address]   ASSESSMENT:   CLINICAL IMPRESSION: Session focused on review of exercises and adding squat hold over chair which patient was able to perform with good form. Fatigue in thighs noted but no pain.Reviewed entire program and answered all questions, patient to DC to  HEP secondary to  progress made.  OBJECTIVE IMPAIRMENTS: decreased activity tolerance, decreased ROM, decreased strength, postural dysfunction, and pain.    ACTIVITY LIMITATIONS: lifting, bending, squatting, and transfers   PARTICIPATION LIMITATIONS: cleaning and occupation   PERSONAL FACTORS: Age, Fitness, and 1 comorbidity: B TKA  are also affecting patient's functional outcome.    REHAB POTENTIAL: Good   CLINICAL DECISION MAKING: Stable/uncomplicated   EVALUATION COMPLEXITY: Low     GOALS: Goals reviewed with patient? yes   SHORT TERM GOALS: Target date: 01/02/2023  Patient will be independent in self management strategies to improve quality of life and functional outcomes. Baseline: New Program Goal status: PROGRESSING   2.  Patient will report at least 50% improvement in overall symptoms and/or function to demonstrate improved functional mobility Baseline: 0% better Goal status: MET       LONG TERM GOALS: Target date: 02/13/2023    Patient will report at least 75% improvement in overall symptoms and/or function to demonstrate improved functional mobility Baseline: 0% better Goal status: MET   2.  Patient will improve score on FOTO outcomes measure to projected score to demonstrate overall improved function and QOL Baseline: see above Goal status: MET   3.  Patient will report performing regular exercises to help with strength and mobility Baseline:  not currently  Goal status: PROGRESSING    4.  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 Baseline: painful/unable Goal status: MET - with STS      PLAN:   PT FREQUENCY: 1-2x/week for total of 12 visits over 12 weeks certification period   PT DURATION: 12 weeks   PLANNED INTERVENTIONS: Therapeutic exercises, Therapeutic activity, Neuromuscular re-education, Balance training, Gait training, Patient/Family education, Self Care,  Joint mobilization, Joint manipulation, Stair training, Orthotic/Fit training, DME instructions, Aquatic Therapy, Dry Needling, Electrical stimulation, Spinal manipulation, Spinal mobilization, Cryotherapy, Moist heat, Traction, Ultrasound, Ionotophoresis 4mg /ml Dexamethasone, Manual therapy, and Re-evaluation.   PLAN FOR NEXT SESSION: DC to HEP   11:05 AM, 01/16/23 Tereasa Coop, DPT Physical Therapy with [facility]

## 2023-01-28 DIAGNOSIS — M9901 Segmental and somatic dysfunction of cervical region: Secondary | ICD-10-CM | POA: Diagnosis not present

## 2023-01-28 DIAGNOSIS — M4312 Spondylolisthesis, cervical region: Secondary | ICD-10-CM | POA: Diagnosis not present

## 2023-01-28 DIAGNOSIS — M50322 Other cervical disc degeneration at C5-C6 level: Secondary | ICD-10-CM | POA: Diagnosis not present

## 2023-01-28 DIAGNOSIS — M9902 Segmental and somatic dysfunction of thoracic region: Secondary | ICD-10-CM | POA: Diagnosis not present

## 2023-02-06 DIAGNOSIS — M50322 Other cervical disc degeneration at C5-C6 level: Secondary | ICD-10-CM | POA: Diagnosis not present

## 2023-02-06 DIAGNOSIS — M9901 Segmental and somatic dysfunction of cervical region: Secondary | ICD-10-CM | POA: Diagnosis not present

## 2023-02-06 DIAGNOSIS — M4312 Spondylolisthesis, cervical region: Secondary | ICD-10-CM | POA: Diagnosis not present

## 2023-02-06 DIAGNOSIS — M9902 Segmental and somatic dysfunction of thoracic region: Secondary | ICD-10-CM | POA: Diagnosis not present

## 2023-02-13 DIAGNOSIS — H26493 Other secondary cataract, bilateral: Secondary | ICD-10-CM | POA: Diagnosis not present

## 2023-02-13 DIAGNOSIS — H524 Presbyopia: Secondary | ICD-10-CM | POA: Diagnosis not present

## 2023-02-13 DIAGNOSIS — H43392 Other vitreous opacities, left eye: Secondary | ICD-10-CM | POA: Diagnosis not present

## 2023-02-13 DIAGNOSIS — H35363 Drusen (degenerative) of macula, bilateral: Secondary | ICD-10-CM | POA: Diagnosis not present

## 2023-02-13 DIAGNOSIS — M50322 Other cervical disc degeneration at C5-C6 level: Secondary | ICD-10-CM | POA: Diagnosis not present

## 2023-02-13 DIAGNOSIS — M4312 Spondylolisthesis, cervical region: Secondary | ICD-10-CM | POA: Diagnosis not present

## 2023-02-13 DIAGNOSIS — M9902 Segmental and somatic dysfunction of thoracic region: Secondary | ICD-10-CM | POA: Diagnosis not present

## 2023-02-13 DIAGNOSIS — M9901 Segmental and somatic dysfunction of cervical region: Secondary | ICD-10-CM | POA: Diagnosis not present

## 2023-02-13 DIAGNOSIS — H35371 Puckering of macula, right eye: Secondary | ICD-10-CM | POA: Diagnosis not present

## 2023-02-25 DIAGNOSIS — M4312 Spondylolisthesis, cervical region: Secondary | ICD-10-CM | POA: Diagnosis not present

## 2023-02-25 DIAGNOSIS — M50322 Other cervical disc degeneration at C5-C6 level: Secondary | ICD-10-CM | POA: Diagnosis not present

## 2023-02-25 DIAGNOSIS — M9901 Segmental and somatic dysfunction of cervical region: Secondary | ICD-10-CM | POA: Diagnosis not present

## 2023-02-25 DIAGNOSIS — M9902 Segmental and somatic dysfunction of thoracic region: Secondary | ICD-10-CM | POA: Diagnosis not present

## 2023-03-04 DIAGNOSIS — M50322 Other cervical disc degeneration at C5-C6 level: Secondary | ICD-10-CM | POA: Diagnosis not present

## 2023-03-04 DIAGNOSIS — M9902 Segmental and somatic dysfunction of thoracic region: Secondary | ICD-10-CM | POA: Diagnosis not present

## 2023-03-04 DIAGNOSIS — M4312 Spondylolisthesis, cervical region: Secondary | ICD-10-CM | POA: Diagnosis not present

## 2023-03-04 DIAGNOSIS — M9901 Segmental and somatic dysfunction of cervical region: Secondary | ICD-10-CM | POA: Diagnosis not present

## 2023-03-11 DIAGNOSIS — L820 Inflamed seborrheic keratosis: Secondary | ICD-10-CM | POA: Diagnosis not present

## 2023-03-11 DIAGNOSIS — L570 Actinic keratosis: Secondary | ICD-10-CM | POA: Diagnosis not present

## 2023-03-11 DIAGNOSIS — L738 Other specified follicular disorders: Secondary | ICD-10-CM | POA: Diagnosis not present

## 2023-03-11 DIAGNOSIS — D3612 Benign neoplasm of peripheral nerves and autonomic nervous system, upper limb, including shoulder: Secondary | ICD-10-CM | POA: Diagnosis not present

## 2023-03-11 DIAGNOSIS — L821 Other seborrheic keratosis: Secondary | ICD-10-CM | POA: Diagnosis not present

## 2023-03-11 DIAGNOSIS — D692 Other nonthrombocytopenic purpura: Secondary | ICD-10-CM | POA: Diagnosis not present

## 2023-04-02 DIAGNOSIS — M9901 Segmental and somatic dysfunction of cervical region: Secondary | ICD-10-CM | POA: Diagnosis not present

## 2023-04-02 DIAGNOSIS — M50322 Other cervical disc degeneration at C5-C6 level: Secondary | ICD-10-CM | POA: Diagnosis not present

## 2023-04-02 DIAGNOSIS — M9902 Segmental and somatic dysfunction of thoracic region: Secondary | ICD-10-CM | POA: Diagnosis not present

## 2023-04-02 DIAGNOSIS — M4312 Spondylolisthesis, cervical region: Secondary | ICD-10-CM | POA: Diagnosis not present

## 2023-04-05 DIAGNOSIS — M25561 Pain in right knee: Secondary | ICD-10-CM | POA: Diagnosis not present

## 2023-04-14 DIAGNOSIS — M25511 Pain in right shoulder: Secondary | ICD-10-CM | POA: Diagnosis not present

## 2023-04-25 DIAGNOSIS — M13841 Other specified arthritis, right hand: Secondary | ICD-10-CM | POA: Diagnosis not present

## 2023-04-25 DIAGNOSIS — M13842 Other specified arthritis, left hand: Secondary | ICD-10-CM | POA: Diagnosis not present

## 2023-05-19 ENCOUNTER — Telehealth: Payer: Self-pay | Admitting: Physician Assistant

## 2023-05-19 NOTE — Telephone Encounter (Signed)
Please call pt and schedule an appt per [facility] in order for Zolpidem to be filled.

## 2023-05-19 NOTE — Telephone Encounter (Signed)
Prescription Request  05/19/2023  LOV: 10/17/2022  What is the name of the medication or equipment? zolpidem (AMBIEN) 10 MG tablet   Have you contacted your pharmacy to request a refill? Yes   Which pharmacy would you like this sent to?  [id number] [place], [place] - [address] [place] [address] Phone: [phone] Fax: [phone]    Patient notified that their request is being sent to the clinical staff for review and that they should receive a response within 2 business days.   Please advise at Mobile [phone] (mobile)

## 2023-05-19 NOTE — Telephone Encounter (Signed)
Pt requesting refill for Zolpidem 10 mg, Last OV 10/17/2022.

## 2023-05-19 NOTE — Telephone Encounter (Signed)
Patient is scheduled for appointment on 05/20/23

## 2023-05-20 ENCOUNTER — Encounter: Payer: Self-pay | Admitting: Physician Assistant

## 2023-05-20 ENCOUNTER — Ambulatory Visit (INDEPENDENT_AMBULATORY_CARE_PROVIDER_SITE_OTHER): Payer: BC Managed Care – PPO | Admitting: Physician Assistant

## 2023-05-20 VITALS — BP 112/70 | HR 102 | Temp 97.1°F

## 2023-05-20 DIAGNOSIS — K589 Irritable bowel syndrome without diarrhea: Secondary | ICD-10-CM | POA: Diagnosis not present

## 2023-05-20 DIAGNOSIS — I10 Essential (primary) hypertension: Secondary | ICD-10-CM

## 2023-05-20 DIAGNOSIS — G4700 Insomnia, unspecified: Secondary | ICD-10-CM | POA: Diagnosis not present

## 2023-05-20 DIAGNOSIS — Z0000 Encounter for general adult medical examination without abnormal findings: Secondary | ICD-10-CM | POA: Diagnosis not present

## 2023-05-20 DIAGNOSIS — M85852 Other specified disorders of bone density and structure, left thigh: Secondary | ICD-10-CM

## 2023-05-20 MED ORDER — ZOLPIDEM TARTRATE 10 MG PO TABS
ORAL_TABLET | ORAL | 5 refills | Status: DC
Start: 2023-05-20 — End: 2023-11-25

## 2023-05-20 MED ORDER — IPRATROPIUM BROMIDE 0.06 % NA SOLN: 2.0000 | Freq: Two times a day (BID) | NASAL | 2 refills | Status: DC

## 2023-05-20 NOTE — Progress Notes (Signed)
Subjective:    Emma Swanson is a 82 y.o. female and is here for a comprehensive physical exam.  HPI  Health Maintenance Due  Topic Date Due   Colonoscopy  Never done   Acute Concerns: None.  Chronic Issues: Osteoporosis/ Osteoarthritis:  Endorses chronic joint pain.  Has not been taking fosamax, states she has forgotten since the prescription is only once weekly; did take it yesterday.  Participating in stretch zone stretches.  IBS: Is due for colonoscopy.  States she has to schedule a follow up with GI soon.  Is established with Dr. Ileene Patrick.  Plans to schedule her colonoscopy; she is planning to call the office to get this scheduled.  HTN Currently taking lisinopril-hctz 10-12.5 mg. At home blood pressure readings are: not checked. Patient denies chest pain, SOB, blurred vision, dizziness, unusual headaches, lower leg swelling. Patient is compliant with medication. Denies excessive caffeine intake, stimulant usage, excessive alcohol intake, or increase in salt consumption.  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 Pt will schedule soon.  Mammogram -- UTD, last done 12/23/2022. Results were normal.  PAP -- Aged out. Bone Density -- UTD, last done on 05/20/2022.  Diet -- Reports "poor eating habits". Eating frozen foods from [employer] Exercise -- Not exercising regularly, no weight training. Goes to [employer].   Sleep habits -- No concerns.  Mood -- Stable.   UTD with dentist? - yes UTD with eye doctor? - yes, recently followed up for cataracts. Still using readers.   Weight history: Wt Readings from Last 10 Encounters:  01/09/23 135 lb 3.2 oz (61.3 kg)  10/17/22 138 lb (62.6 kg)  07/04/22 138 lb 12.8  oz (63 kg)  05/07/22 139 lb (63 kg)  03/04/22 141 lb 6.1 oz (64.1 kg)  02/15/22 142 lb (64.4 kg)  01/29/22 145 lb (65.8 kg)  11/16/21 145 lb 5 oz (65.9 kg)  11/01/21 145 lb 6.4 oz (66 kg)  07/25/21 136 lb (61.7 kg)   There is no height or weight on file to calculate BMI. No LMP recorded. Patient is postmenopausal.  Alcohol use:  reports current alcohol use.  Tobacco use:  Tobacco Use: Medium Risk (05/20/2023)   Patient History    Smoking Tobacco Use: Former    Smokeless Tobacco Use: Never    Passive Exposure: Not on file   Eligible for lung cancer screening? no     05/20/2023   11:02 AM  Depression screen PHQ 2/9  Decreased Interest 0  Down, Depressed, Hopeless 0  PHQ - 2 Score 0     Other providers/specialists: Patient Care Team: Jarold Motto, [place] as PCP - General (Physician Assistant)    PMHx, SurgHx, SocialHx, Medications, and Allergies were reviewed in the Visit Navigator and updated as appropriate.   Past Medical History:  Diagnosis Date   Arthritis    Asthma    Cancer (HCC)    basal cells   Colon polyps    Family history of adverse reaction to anesthesia    see malignant hyperthermia tab    GERD (gastroesophageal reflux disease)    Hypertension    Hypothyroidism    Malignant hyperthermia    cousin's son- - malignant hyperthermia al most died  ,  patient's brother and  his 2 sons tested positive for malignant hyperthermia   Palpitations    PONV (postoperative nausea and vomiting)    Vitamin D deficiency      Past Surgical History:  Procedure Laterality Date   AUGMENTATION MAMMAPLASTY Bilateral    bilateral arthroscopic knee surgery      bone spur removal      BREAST BIOPSY     CLAVICLE SURGERY Right    COLONOSCOPY     left ankle surgery - had pins      RESECTION DISTAL CLAVICAL Right 11/08/2021   thyroid surgery - age 89      TOTAL KNEE ARTHROPLASTY Right 11/25/2017   Procedure: RIGHT TOTAL KNEE ARTHROPLASTY;  Surgeon: Durene Romans, MD;   Location: [facility];  Service: Orthopedics;  Laterality: Right;  70 mins   TOTAL KNEE ARTHROPLASTY Left 04/16/2018   Procedure: LEFT TOTAL KNEE ARTHROPLASTY;  Surgeon: Durene Romans, MD;  Location: [facility];  Service: Orthopedics;  Laterality: Left;  90 mins   TOTAL SHOULDER ARTHROPLASTY Left 02/10/2020   Procedure: TOTAL SHOULDER ARTHROPLASTY;  Surgeon: Francena Hanly, MD;  Location: [facility];  Service: Orthopedics;  Laterality: Left;      Family History  Problem Relation Age of Onset   Cancer Mother    Other Mother        tumors removed from intestines - uncertain etiology   Stroke Father    Hypertension Brother    Diabetes Brother    Heart disease Brother    Malignant hyperthermia Brother    Pancreatic cancer Paternal Aunt    Cancer Other    Pancreatic cancer Other    Malignant hyperthermia Other    Colon cancer Neg Hx    Esophageal cancer Neg Hx    Stomach cancer Neg Hx     Social History   Tobacco Use   Smoking status: Former    Types: Cigarettes   Smokeless tobacco: Never   Tobacco comments:    age of 18 until age 61   Vaping Use   Vaping status: Never Used  Substance Use Topics   Alcohol use: Yes    Comment: 1-3 glasses of wine per night   Drug use: Never    Review of Systems:   Review of Systems  Constitutional:  Negative for chills, fever, malaise/fatigue and weight loss.  HENT:  Negative for hearing loss, sinus pain and sore throat.   Respiratory:  Negative for cough and hemoptysis.   Cardiovascular:  Negative for chest pain, palpitations, leg swelling and PND.  Gastrointestinal:  Negative for abdominal pain, constipation, diarrhea, heartburn, nausea and vomiting.  Genitourinary:  Negative for dysuria, frequency and urgency.  Musculoskeletal:  Negative for back pain, myalgias and neck pain.  Skin:  Negative for itching and rash.  Neurological:  Negative for dizziness, tingling, seizures and headaches.  Endo/Heme/Allergies:  Negative for polydipsia.   Psychiatric/Behavioral:  Negative for depression. The patient is not nervous/anxious.     Objective:   BP 112/70 (BP Location: Left Arm, Patient Position: Sitting, Cuff Size: Normal)   Pulse (!) 102   Temp (!) 97.1 F (36.2 C) (Temporal)   SpO2 95%  There is no height or weight on file to calculate BMI.   General Appearance:    Alert, cooperative, no distress, appears stated age  Head:    Normocephalic, without obvious abnormality, atraumatic  Eyes:    PERRL, conjunctiva/corneas clear, EOM's intact, fundi    benign, both eyes  Ears:    Normal TM's  and external ear canals, both ears  Nose:   Nares normal, septum midline, mucosa normal, no drainage    or sinus tenderness  Throat:   Lips, mucosa, and tongue normal; teeth and gums normal  Neck:   Supple, symmetrical, trachea midline, no adenopathy;    thyroid:  no enlargement/tenderness/nodules; no carotid   bruit or JVD  Back:     Symmetric, no curvature, ROM normal, no CVA tenderness  Lungs:     Clear to auscultation bilaterally, respirations unlabored  Chest Wall:    No tenderness or deformity   Heart:    Regular rate and rhythm, S1 and S2 normal, no murmur, rub or gallop  Breast Exam:    Deferred  Abdomen:     Soft, non-tender, bowel sounds active all four quadrants,    no masses, no organomegaly  Genitalia:    Deferred  Extremities:   Extremities normal, atraumatic, no cyanosis or edema  Pulses:   2+ and symmetric all extremities  Skin:   Skin color, texture, turgor normal, no rashes or lesions  Lymph nodes:   Cervical, supraclavicular, and axillary nodes normal  Neurologic:   CNII-XII intact, normal strength, sensation and reflexes    throughout    Assessment/Plan:   Routine physical examination Today patient counseled on age appropriate routine health concerns for screening and prevention, each reviewed and up to date or declined. Immunizations reviewed and up to date or declined. Labs declined today. Risk factors for  depression reviewed and negative. Hearing function and visual acuity are intact. ADLs screened and addressed as needed. Functional ability and level of safety reviewed and appropriate. Education, counseling and referrals performed based on assessed risks today. Patient provided with a copy of personalized plan for preventive services.  Primary hypertension Normotensive Continue lisinopril-hctz 10-12.5 mg Follow-up in 6 month(s), sooner if concerns  Irritable bowel syndrome, unspecified type Overall stable Discussed need for colonoscopy -- she plans to reach out to [facility] gastroenterology for this  Insomnia, unspecified type Overall stable Continue Ambien 10 mg daily as needed No red flags on prescription drug monitoring program Recommend close follow-up if new side effect(s)  Follow-up in 6 month(s), sooner if concerns  Osteopenia of neck of left femur Encouraged regular intake of fosamax and attempting regular weight bearing activity   I, Isabelle Course, acting as a Neurosurgeon for [employer], [place]., have documented all relevant documentation on the behalf of Jarold Motto, [place], as directed by  Jarold Motto, PA while in the presence of Jarold Motto, [place].  I, Isabelle Course, have reviewed all documentation for this visit. The documentation on 05/20/23 for the exam, diagnosis, procedures, and orders are all accurate and complete.  Jarold Motto, PA-C [place] [facility]

## 2023-06-27 DIAGNOSIS — M13842 Other specified arthritis, left hand: Secondary | ICD-10-CM | POA: Diagnosis not present

## 2023-06-27 DIAGNOSIS — M13841 Other specified arthritis, right hand: Secondary | ICD-10-CM | POA: Diagnosis not present

## 2023-06-27 DIAGNOSIS — M25531 Pain in right wrist: Secondary | ICD-10-CM | POA: Diagnosis not present

## 2023-07-28 DIAGNOSIS — M4312 Spondylolisthesis, cervical region: Secondary | ICD-10-CM | POA: Diagnosis not present

## 2023-07-28 DIAGNOSIS — M9901 Segmental and somatic dysfunction of cervical region: Secondary | ICD-10-CM | POA: Diagnosis not present

## 2023-07-28 DIAGNOSIS — M50322 Other cervical disc degeneration at C5-C6 level: Secondary | ICD-10-CM | POA: Diagnosis not present

## 2023-07-28 DIAGNOSIS — M9902 Segmental and somatic dysfunction of thoracic region: Secondary | ICD-10-CM | POA: Diagnosis not present

## 2023-08-04 DIAGNOSIS — M9901 Segmental and somatic dysfunction of cervical region: Secondary | ICD-10-CM | POA: Diagnosis not present

## 2023-08-04 DIAGNOSIS — M4312 Spondylolisthesis, cervical region: Secondary | ICD-10-CM | POA: Diagnosis not present

## 2023-08-04 DIAGNOSIS — M9902 Segmental and somatic dysfunction of thoracic region: Secondary | ICD-10-CM | POA: Diagnosis not present

## 2023-08-04 DIAGNOSIS — M50322 Other cervical disc degeneration at C5-C6 level: Secondary | ICD-10-CM | POA: Diagnosis not present

## 2023-08-20 DIAGNOSIS — M9902 Segmental and somatic dysfunction of thoracic region: Secondary | ICD-10-CM | POA: Diagnosis not present

## 2023-08-20 DIAGNOSIS — M50322 Other cervical disc degeneration at C5-C6 level: Secondary | ICD-10-CM | POA: Diagnosis not present

## 2023-08-20 DIAGNOSIS — M4312 Spondylolisthesis, cervical region: Secondary | ICD-10-CM | POA: Diagnosis not present

## 2023-08-20 DIAGNOSIS — M9901 Segmental and somatic dysfunction of cervical region: Secondary | ICD-10-CM | POA: Diagnosis not present

## 2023-08-24 ENCOUNTER — Other Ambulatory Visit: Payer: Self-pay | Admitting: Physician Assistant

## 2023-09-01 DIAGNOSIS — H02886 Meibomian gland dysfunction of left eye, unspecified eyelid: Secondary | ICD-10-CM | POA: Diagnosis not present

## 2023-09-01 DIAGNOSIS — H04123 Dry eye syndrome of bilateral lacrimal glands: Secondary | ICD-10-CM | POA: Diagnosis not present

## 2023-09-01 DIAGNOSIS — H02883 Meibomian gland dysfunction of right eye, unspecified eyelid: Secondary | ICD-10-CM | POA: Diagnosis not present

## 2023-09-01 DIAGNOSIS — H16223 Keratoconjunctivitis sicca, not specified as Sjogren's, bilateral: Secondary | ICD-10-CM | POA: Diagnosis not present

## 2023-09-04 DIAGNOSIS — M50322 Other cervical disc degeneration at C5-C6 level: Secondary | ICD-10-CM | POA: Diagnosis not present

## 2023-09-04 DIAGNOSIS — M9901 Segmental and somatic dysfunction of cervical region: Secondary | ICD-10-CM | POA: Diagnosis not present

## 2023-09-04 DIAGNOSIS — M9902 Segmental and somatic dysfunction of thoracic region: Secondary | ICD-10-CM | POA: Diagnosis not present

## 2023-09-04 DIAGNOSIS — M4312 Spondylolisthesis, cervical region: Secondary | ICD-10-CM | POA: Diagnosis not present

## 2023-09-09 DIAGNOSIS — M545 Low back pain, unspecified: Secondary | ICD-10-CM | POA: Diagnosis not present

## 2023-09-09 DIAGNOSIS — M13842 Other specified arthritis, left hand: Secondary | ICD-10-CM | POA: Diagnosis not present

## 2023-09-09 DIAGNOSIS — M13841 Other specified arthritis, right hand: Secondary | ICD-10-CM | POA: Diagnosis not present

## 2023-09-11 DIAGNOSIS — M9902 Segmental and somatic dysfunction of thoracic region: Secondary | ICD-10-CM | POA: Diagnosis not present

## 2023-09-11 DIAGNOSIS — M4312 Spondylolisthesis, cervical region: Secondary | ICD-10-CM | POA: Diagnosis not present

## 2023-09-11 DIAGNOSIS — M9901 Segmental and somatic dysfunction of cervical region: Secondary | ICD-10-CM | POA: Diagnosis not present

## 2023-09-11 DIAGNOSIS — M50322 Other cervical disc degeneration at C5-C6 level: Secondary | ICD-10-CM | POA: Diagnosis not present

## 2023-09-17 ENCOUNTER — Ambulatory Visit: Payer: BC Managed Care – PPO | Admitting: Gastroenterology

## 2023-09-22 ENCOUNTER — Other Ambulatory Visit: Payer: Self-pay | Admitting: Physician Assistant

## 2023-09-30 ENCOUNTER — Ambulatory Visit (INDEPENDENT_AMBULATORY_CARE_PROVIDER_SITE_OTHER): Admitting: Physician Assistant

## 2023-09-30 VITALS — BP 120/84 | HR 81 | Temp 98.0°F | Ht 67.0 in | Wt 138.0 lb

## 2023-09-30 DIAGNOSIS — I10 Essential (primary) hypertension: Secondary | ICD-10-CM

## 2023-09-30 DIAGNOSIS — G4700 Insomnia, unspecified: Secondary | ICD-10-CM | POA: Diagnosis not present

## 2023-09-30 MED ORDER — LISINOPRIL-HYDROCHLOROTHIAZIDE 10-12.5 MG PO TABS: 1.0000 | ORAL_TABLET | Freq: Every day | ORAL | 1 refills | Status: DC

## 2023-09-30 MED ORDER — IPRATROPIUM BROMIDE 0.06 % NA SOLN: 2.0000 | Freq: Two times a day (BID) | NASAL | 2 refills | Status: DC

## 2023-09-30 NOTE — Progress Notes (Signed)
 Emma Swanson is a 83 y.o. female here for a follow up of a pre-existing problem.  History of Present Illness:   Chief Complaint  Patient presents with   Medication Refill   Hypertension    HPI  HTN:  Pt is on Lisinopril-HCTZ 10-12.5 mg once daily.  Tolerating well with no side effects reported. Not monitoring her BP at home but does have a cuff at home.   Insomnia: Pt is on Ambien 10 mg as needed Tolerating well with no side effects.  No acute concerns.   Has an appt with GI on 4/2. Plans to schedule her colonoscopy at this visit.   Past Medical History:  Diagnosis Date   Arthritis    Asthma    Cancer (HCC)    basal cells   Colon polyps    Family history of adverse reaction to anesthesia    see malignant hyperthermia tab    GERD (gastroesophageal reflux disease)    Hypertension    Hypothyroidism    Malignant hyperthermia    cousin's son- - malignant hyperthermia al most died  ,  patient's brother and his 2 sons tested positive for malignant hyperthermia   Palpitations    PONV (postoperative nausea and vomiting)    Vitamin D deficiency      Social History   Tobacco Use   Smoking status: Former    Types: Cigarettes   Smokeless tobacco: Never   Tobacco comments:    age of 87 until age 42   Vaping Use   Vaping status: Never Used  Substance Use Topics   Alcohol use: Yes    Comment: 1-3 glasses of wine per night   Drug use: Never    Past Surgical History:  Procedure Laterality Date   AUGMENTATION MAMMAPLASTY Bilateral    bilateral arthroscopic knee surgery      bone spur removal      BREAST BIOPSY     CLAVICLE SURGERY Right    COLONOSCOPY     left ankle surgery - had pins      RESECTION DISTAL CLAVICAL Right 11/08/2021   thyroid surgery - age 38      TOTAL KNEE ARTHROPLASTY Right 11/25/2017   Procedure: RIGHT TOTAL KNEE ARTHROPLASTY;  Surgeon: Durene Romans, MD;  Location: [facility];  Service: Orthopedics;  Laterality: Right;  70 mins   TOTAL KNEE  ARTHROPLASTY Left 04/16/2018   Procedure: LEFT TOTAL KNEE ARTHROPLASTY;  Surgeon: Durene Romans, MD;  Location: [facility];  Service: Orthopedics;  Laterality: Left;  90 mins   TOTAL SHOULDER ARTHROPLASTY Left 02/10/2020   Procedure: TOTAL SHOULDER ARTHROPLASTY;  Surgeon: Francena Hanly, MD;  Location: [facility];  Service: Orthopedics;  Laterality: Left;     Family History  Problem Relation Age of Onset   Cancer Mother    Other Mother        tumors removed from intestines - uncertain etiology   Stroke Father    Hypertension Brother    Diabetes Brother    Heart disease Brother    Malignant hyperthermia Brother    Pancreatic cancer Paternal Aunt    Cancer Other    Pancreatic cancer Other    Malignant hyperthermia Other    Colon cancer Neg Hx    Esophageal cancer Neg Hx    Stomach cancer Neg Hx     Allergies  Allergen Reactions   Other     general anesthesia - pt's family has history of malignant hyperthermia   Oxycodone  Hallucinations    Penicillins     Childhood reaction Has patient had a PCN reaction causing immediate rash, facial/tongue/throat swelling, SOB or lightheadedness with hypotension: Unknown Has patient had a PCN reaction causing severe rash involving mucus membranes or skin necrosis: Unknown Has patient had a PCN reaction that required hospitalization: Unknown Has patient had a PCN reaction occurring within the last 10 years: No  Tolerated ancef 02/10/2020     Current Medications:   Current Outpatient Medications:    alendronate (FOSAMAX) 70 MG tablet, TAKE 1 TABLET (70 MG TOTAL) BY MOUTH EVERY 7 DAYS WITH FULL GLASS WATER ON EMPTY STOMACH, Disp: 4 tablet, Rfl: 11   cholecalciferol (VITAMIN D3) 25 MCG (1000 UNIT) tablet, Take 5,000 Units by mouth daily., Disp: , Rfl:    clobetasol ointment (TEMOVATE) 0.05 %, Apply topically 2 (two) times daily., Disp: , Rfl:    estradiol (ESTRACE) 0.1 MG/GM vaginal cream, Place 1 Applicatorful vaginally 2 (two) times a  week., Disp: , Rfl:    loperamide (IMODIUM A-D) 2 MG tablet, Take 1 tablet (2 mg total) by mouth in the morning. Increase to twice a day if needed, Disp: 30 tablet, Rfl: 0   Multiple Vitamin (MULTIVITAMIN WITH MINERALS) TABS tablet, Take 1 tablet by mouth daily., Disp: , Rfl:    RESTASIS 0.05 % ophthalmic emulsion, Place 1 drop into both eyes 2 (two) times daily., Disp: , Rfl:    triamcinolone cream (KENALOG) 0.1 %, Apply 1 Application topically 2 (two) times daily., Disp: 30 g, Rfl: 0   zolpidem (AMBIEN) 10 MG tablet, TAKE 1 TABLET BY MOUTH EVERYDAY AT BEDTIME, Disp: 30 tablet, Rfl: 5   ipratropium (ATROVENT) 0.06 % nasal spray, Place 2 sprays into both nostrils 2 (two) times daily., Disp: 15 mL, Rfl: 2   lisinopril-hydrochlorothiazide (ZESTORETIC) 10-12.5 MG tablet, Take 1 tablet by mouth daily., Disp: 90 tablet, Rfl: 1   Peppermint Oil (IBGARD) 90 MG CPCR, prn (Patient not taking: Reported on 09/30/2023), Disp: , Rfl:    Review of Systems:   Negative unless otherwise specified per HPI.  Vitals:   Vitals:   09/30/23 1511  BP: 120/84  Pulse: 81  Temp: 98 F (36.7 C)  TempSrc: Temporal  SpO2: 97%  Weight: 138 lb (62.6 kg)  Height: 5\' 7"  (1.702 m)     Body mass index is 21.61 kg/m.  Physical Exam:   Physical Exam Vitals and nursing note reviewed.  Constitutional:      General: She is not in acute distress.    Appearance: She is well-developed. She is not ill-appearing or toxic-appearing.  Cardiovascular:     Rate and Rhythm: Normal rate and regular rhythm.     Pulses: Normal pulses.     Heart sounds: Normal heart sounds, S1 normal and S2 normal.  Pulmonary:     Effort: Pulmonary effort is normal.     Breath sounds: Normal breath sounds.  Skin:    General: Skin is warm and dry.  Neurological:     Mental Status: She is alert.     GCS: GCS eye subscore is 4. GCS verbal subscore is 5. GCS motor subscore is 6.  Psychiatric:        Speech: Speech normal.        Behavior:  Behavior normal. Behavior is cooperative.     Assessment and Plan:   1. Primary hypertension (Primary) - Comprehensive metabolic panel - TSH Normotensive Continue lisinopril-hydrochloroTHIAZIDE 10-12.5 mg daily Follow up in 6 month(s), sooner if concerns  2. Insomnia, unspecified type Well controlled PDMP reviewed during this encounter. Continue Ambien as prescribed She is aware of risks/benefits/side effect(s) -- currently not having any adverse effects Follow up in 6 month(s), sooner if concerns  I, Isabelle Course, acting as a Neurosurgeon for Jarold Motto, [place]., have documented all relevant documentation on the behalf of Jarold Motto, [place], as directed by  Jarold Motto, PA while in the presence of Jarold Motto, [place].  I, Jarold Motto, [place], have reviewed all documentation for this visit. The documentation on 09/30/23 for the exam, diagnosis, procedures, and orders are all accurate and complete.  Jarold Motto, PA-C

## 2023-10-01 ENCOUNTER — Encounter: Payer: Self-pay | Admitting: Physician Assistant

## 2023-10-01 DIAGNOSIS — M4312 Spondylolisthesis, cervical region: Secondary | ICD-10-CM | POA: Diagnosis not present

## 2023-10-01 DIAGNOSIS — M9902 Segmental and somatic dysfunction of thoracic region: Secondary | ICD-10-CM | POA: Diagnosis not present

## 2023-10-01 DIAGNOSIS — M9901 Segmental and somatic dysfunction of cervical region: Secondary | ICD-10-CM | POA: Diagnosis not present

## 2023-10-01 DIAGNOSIS — M50322 Other cervical disc degeneration at C5-C6 level: Secondary | ICD-10-CM | POA: Diagnosis not present

## 2023-10-01 LAB — COMPREHENSIVE METABOLIC PANEL
ALT: 19 U/L (ref 0–35)
AST: 20 U/L (ref 0–37)
Albumin: 4.3 g/dL (ref 3.5–5.2)
Alkaline Phosphatase: 56 U/L (ref 39–117)
BUN: 29 mg/dL — ABNORMAL HIGH (ref 6–23)
CO2: 27 meq/L (ref 19–32)
Calcium: 9.4 mg/dL (ref 8.4–10.5)
Chloride: 101 meq/L (ref 96–112)
Creatinine, Ser: 0.79 mg/dL (ref 0.40–1.20)
GFR: 70.02 mL/min (ref >60.00)
Glucose, Bld: 75 mg/dL (ref 70–99)
Potassium: 4 meq/L (ref 3.5–5.1)
Sodium: 137 meq/L (ref 135–145)
Total Bilirubin: 1 mg/dL (ref 0.2–1.2)
Total Protein: 6.9 g/dL (ref 6.0–8.3)

## 2023-10-01 LAB — TSH: TSH: 0.73 u[IU]/mL (ref 0.35–5.50)

## 2023-10-08 DIAGNOSIS — M9902 Segmental and somatic dysfunction of thoracic region: Secondary | ICD-10-CM | POA: Diagnosis not present

## 2023-10-08 DIAGNOSIS — M50322 Other cervical disc degeneration at C5-C6 level: Secondary | ICD-10-CM | POA: Diagnosis not present

## 2023-10-08 DIAGNOSIS — M51369 Other intervertebral disc degeneration, lumbar region without mention of lumbar back pain or lower extremity pain: Secondary | ICD-10-CM | POA: Insufficient documentation

## 2023-10-08 DIAGNOSIS — M545 Low back pain, unspecified: Secondary | ICD-10-CM | POA: Diagnosis not present

## 2023-10-08 DIAGNOSIS — M4312 Spondylolisthesis, cervical region: Secondary | ICD-10-CM | POA: Diagnosis not present

## 2023-10-08 DIAGNOSIS — M9901 Segmental and somatic dysfunction of cervical region: Secondary | ICD-10-CM | POA: Diagnosis not present

## 2023-10-09 DIAGNOSIS — L570 Actinic keratosis: Secondary | ICD-10-CM | POA: Diagnosis not present

## 2023-10-09 DIAGNOSIS — L820 Inflamed seborrheic keratosis: Secondary | ICD-10-CM | POA: Diagnosis not present

## 2023-10-15 DIAGNOSIS — M9902 Segmental and somatic dysfunction of thoracic region: Secondary | ICD-10-CM | POA: Diagnosis not present

## 2023-10-15 DIAGNOSIS — M4312 Spondylolisthesis, cervical region: Secondary | ICD-10-CM | POA: Diagnosis not present

## 2023-10-15 DIAGNOSIS — M9901 Segmental and somatic dysfunction of cervical region: Secondary | ICD-10-CM | POA: Diagnosis not present

## 2023-10-15 DIAGNOSIS — M50322 Other cervical disc degeneration at C5-C6 level: Secondary | ICD-10-CM | POA: Diagnosis not present

## 2023-10-21 NOTE — Progress Notes (Unsigned)
 Chief Complaint: Primary GI MD: Dr. 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 Adela Lank.  See his note for details.  History of altered bowel habits and bloating.  She is taking Imodium with improvement but was still struggling with messy to clean stools so Citrucel was recommended.   Discussed the use of AI scribe software for clinical note transcription with the patient, who gave verbal consent to proceed.  History of Present Illness      PREVIOUS GI WORKUP   Flex sig 09/23/18 - no evidence of lymphocytic colitis on path   Celiac labs TTG IgA and IgG negative, total IgA normal   Colonoscopy 10/06/2017 - 3mm rectal adenoma, otherwise biopsies diffusely showed lymphocytic colitis   CT abdomen / pelvis 06/25/20 - IMPRESSION: 1. Subtle inflammatory changes are suggested adjacent to the decompressed left colon, which would support a mild inflammatory or infectious colitis in the proper clinical setting. This portion of the bowel is not well assessed due to lack of distension. 2. No other evidence of an acute abnormality within the abdomen or pelvis. 3. Aortic atherosclerosis  Past Medical History:  Diagnosis Date   Arthritis    Asthma    Cancer (HCC)    basal cells   Colon polyps    Family history of adverse reaction to anesthesia    see malignant hyperthermia tab    GERD (gastroesophageal reflux disease)    Hypertension    Hypothyroidism    Malignant hyperthermia    cousin's son- - malignant hyperthermia al most died  ,  patient's brother and his 2 sons tested positive for malignant hyperthermia   Palpitations    PONV (postoperative nausea and vomiting)    Vitamin D deficiency     Past Surgical History:  Procedure Laterality Date   AUGMENTATION MAMMAPLASTY Bilateral    bilateral arthroscopic knee  surgery      bone spur removal      BREAST BIOPSY     CLAVICLE SURGERY Right    COLONOSCOPY     left ankle surgery - had pins      RESECTION DISTAL CLAVICAL Right 11/08/2021   thyroid surgery - age 40      TOTAL KNEE ARTHROPLASTY Right 11/25/2017   Procedure: RIGHT TOTAL KNEE ARTHROPLASTY;  Surgeon: Durene Romans, MD;  Location: [facility];  Service: Orthopedics;  Laterality: Right;  70 mins   TOTAL KNEE ARTHROPLASTY Left 04/16/2018   Procedure: LEFT TOTAL KNEE ARTHROPLASTY;  Surgeon: Durene Romans, MD;  Location: [facility];  Service: Orthopedics;  Laterality: Left;  90 mins   TOTAL SHOULDER ARTHROPLASTY Left 02/10/2020   Procedure: TOTAL SHOULDER ARTHROPLASTY;  Surgeon: Francena Hanly, MD;  Location: [facility];  Service: Orthopedics;  Laterality: Left;     Current Outpatient Medications  Medication Sig Dispense Refill   alendronate (FOSAMAX) 70 MG tablet TAKE 1 TABLET (70 MG TOTAL) BY MOUTH EVERY 7 DAYS WITH FULL GLASS WATER ON EMPTY STOMACH 4 tablet 11   cholecalciferol (VITAMIN D3) 25 MCG (1000 UNIT) tablet Take 5,000 Units by mouth daily.     clobetasol ointment (TEMOVATE) 0.05 % Apply topically 2 (two) times daily.     estradiol (ESTRACE) 0.1 MG/GM vaginal cream Place 1 Applicatorful vaginally 2 (two) times a week.     ipratropium (ATROVENT) 0.06 % nasal spray Place 2 sprays into both nostrils 2 (two) times daily. 15  mL 2   lisinopril-hydrochlorothiazide (ZESTORETIC) 10-12.5 MG tablet Take 1 tablet by mouth daily. 90 tablet 1   loperamide (IMODIUM A-D) 2 MG tablet Take 1 tablet (2 mg total) by mouth in the morning. Increase to twice a day if needed 30 tablet 0   Multiple Vitamin (MULTIVITAMIN WITH MINERALS) TABS tablet Take 1 tablet by mouth daily.     Peppermint Oil (IBGARD) 90 MG CPCR prn (Patient not taking: Reported on 09/30/2023)     RESTASIS 0.05 % ophthalmic emulsion Place 1 drop into both eyes 2 (two) times daily.     triamcinolone cream (KENALOG) 0.1 % Apply 1 Application  topically 2 (two) times daily. 30 g 0   zolpidem (AMBIEN) 10 MG tablet TAKE 1 TABLET BY MOUTH EVERYDAY AT BEDTIME 30 tablet 5   No current facility-administered medications for this visit.    Allergies as of 10/22/2023 - Review Complete 09/30/2023  Allergen Reaction Noted   Other  11/12/2017   Oxycodone  11/18/2017   Penicillins  11/12/2017    Family History  Problem Relation Age of Onset   Cancer Mother    Other Mother        tumors removed from intestines - uncertain etiology   Stroke Father    Hypertension Brother    Diabetes Brother    Heart disease Brother    Malignant hyperthermia Brother    Pancreatic cancer Paternal Aunt    Cancer Other    Pancreatic cancer Other    Malignant hyperthermia Other    Colon cancer Neg Hx    Esophageal cancer Neg Hx    Stomach cancer Neg Hx     Social History   Socioeconomic History   Marital status: Single    Spouse name: Not on file   Number of children: Not on file   Years of education: Not on file   Highest education level: GED or equivalent  Occupational History   Not on file  Tobacco Use   Smoking status: Former    Types: Cigarettes   Smokeless tobacco: Never   Tobacco comments:    age of 73 until age 77   Vaping Use   Vaping status: Never Used  Substance and Sexual Activity   Alcohol use: Yes    Comment: 1-3 glasses of wine per night   Drug use: Never   Sexual activity: Not Currently  Other Topics Concern   Not on file  Social History Narrative   Flight attendant   Lives with cats   No children   Fun -- scuba diving,  hiking   Social Drivers of Corporate investment banker Strain: Low Risk  (09/30/2023)   Overall Financial Resource Strain (CARDIA)    Difficulty of Paying Living Expenses: Not very hard  Food Insecurity: No Food Insecurity (09/30/2023)   Hunger Vital Sign    Worried About Running Out of Food in the Last Year: Never true    Ran Out of Food in the Last Year: Never true  Transportation Needs:  No Transportation Needs (09/30/2023)   PRAPARE - Administrator, Civil Service (Medical): No    Lack of Transportation (Non-Medical): No  Physical Activity: Insufficiently Active (09/30/2023)   Exercise Vital Sign    Days of Exercise per Week: 2 days    Minutes of Exercise per Session: 50 min  Stress: No Stress Concern Present (09/30/2023)   [employer] of Occupational Health - Occupational Stress Questionnaire    Feeling of Stress :  Only a little  Social Connections: Socially Isolated (09/30/2023)   Social Connection and Isolation Panel [NHANES]    Frequency of Communication with Friends and Family: More than three times a week    Frequency of Social Gatherings with Friends and Family: Once a week    Attends Religious Services: Never    Database administrator or Organizations: No    Attends Engineer, structural: Not on file    Marital Status: Widowed  Intimate Partner Violence: Unknown (10/25/2021)   Received from [facility], [facility]   HITS    Physically Hurt: Not on file    Insult or Talk Down To: Not on file    Threaten Physical Harm: Not on file    Scream or Curse: Not on file    Review of Systems:    Constitutional: No weight loss, fever, chills, weakness or fatigue HEENT: Eyes: No change in vision               Ears, Nose, Throat:  No change in hearing or congestion Skin: No rash or itching Cardiovascular: No chest pain, chest pressure or palpitations   Respiratory: No SOB or cough Gastrointestinal: See HPI and otherwise negative Genitourinary: No dysuria or change in urinary frequency Neurological: No headache, dizziness or syncope Musculoskeletal: No new muscle or joint pain Hematologic: No bleeding or bruising Psychiatric: No history of depression or anxiety    Physical Exam:  Vital signs: There were no vitals taken for this visit.  Constitutional: NAD, Well developed, Well nourished, alert and cooperative Head:  Normocephalic and  atraumatic. Eyes:   PEERL, EOMI. No icterus. Conjunctiva pink. Respiratory: Respirations even and unlabored. Lungs clear to auscultation bilaterally.   No wheezes, crackles, or rhonchi.  Cardiovascular:  Regular rate and rhythm. No peripheral edema, cyanosis or pallor.  Gastrointestinal:  Soft, nondistended, nontender. No rebound or guarding. Normal bowel sounds. No appreciable masses or hepatomegaly. Rectal:  Not performed.  Msk:  Symmetrical without gross deformities. Without edema, no deformity or joint abnormality.  Neurologic:  Alert and  oriented x4;  grossly normal neurologically.  Skin:   Dry and intact without significant lesions or rashes. Psychiatric: Oriented to person, place and time. Demonstrates good judgement and reason without abnormal affect or behaviors.  Physical Exam    RELEVANT LABS AND IMAGING: CBC    Component Value Date/Time   WBC 5.1 05/07/2022 1126   RBC 4.51 05/07/2022 1126   HGB 14.9 05/07/2022 1126   HCT 43.4 05/07/2022 1126   PLT 307.0 05/07/2022 1126   MCV 96.1 05/07/2022 1126   MCH 31.3 01/29/2021 1614   MCHC 34.3 05/07/2022 1126   RDW 12.9 05/07/2022 1126   LYMPHSABS 1.9 05/07/2022 1126   MONOABS 0.6 05/07/2022 1126   EOSABS 0.1 05/07/2022 1126   BASOSABS 0.1 05/07/2022 1126    CMP     Component Value Date/Time   NA 137 09/30/2023 1531   K 4.0 09/30/2023 1531   CL 101 09/30/2023 1531   CO2 27 09/30/2023 1531   GLUCOSE 75 09/30/2023 1531   BUN 29 (H) 09/30/2023 1531   CREATININE 0.79 09/30/2023 1531   CALCIUM 9.4 09/30/2023 1531   PROT 6.9 09/30/2023 1531   ALBUMIN 4.3 09/30/2023 1531   AST 20 09/30/2023 1531   ALT 19 09/30/2023 1531   ALKPHOS 56 09/30/2023 1531   BILITOT 1.0 09/30/2023 1531   GFRNONAA >60 01/29/2021 1614   GFRAA >60 02/02/2020 1151     Assessment/Plan:  Assessment and Plan Assessment & Plan        Lara Mulch [facility] 10/21/2023, 12:29 PM  Cc: Jarold Motto, [place]

## 2023-10-22 ENCOUNTER — Encounter: Payer: Self-pay | Admitting: Gastroenterology

## 2023-10-22 ENCOUNTER — Ambulatory Visit
Admission: RE | Admit: 2023-10-22 | Discharge: 2023-10-22 | Disposition: A | Discharge status: Home / Self Care | Source: Ambulatory Visit | Attending: Gastroenterology

## 2023-10-22 ENCOUNTER — Ambulatory Visit: Payer: BC Managed Care – PPO | Admitting: Gastroenterology

## 2023-10-22 VITALS — BP 120/76 | HR 88 | Ht 67.0 in | Wt 137.8 lb

## 2023-10-22 DIAGNOSIS — R140 Abdominal distension (gaseous): Secondary | ICD-10-CM

## 2023-10-22 DIAGNOSIS — K5900 Constipation, unspecified: Secondary | ICD-10-CM

## 2023-10-22 NOTE — Progress Notes (Signed)
 Agree with assessment and plan as outlined.

## 2023-10-22 NOTE — Patient Instructions (Addendum)
 Your provider has requested that you have an abdominal x ray before leaving today. Please go to the basement floor to our Radiology department for the test.   _______________________________________________________  If your blood pressure at your visit was 140/90 or greater, please contact your primary care physician to follow up on this.  _______________________________________________________  If you are age 82 or older, your body mass index should be between 23-30. Your Body mass index is 21.58 kg/m. If this is out of the aforementioned range listed, please consider follow up with your Primary Care Provider.  If you are age 37 or younger, your body mass index should be between 19-25. Your Body mass index is 21.58 kg/m. If this is out of the aformentioned range listed, please consider follow up with your Primary Care Provider.   ________________________________________________________  The [facility] providers would like to encourage you to use [facility] to communicate with providers for non-urgent requests or questions.  Due to long hold times on the telephone, sending your provider a message by [facility] may be a faster and more efficient way to get a response.  Please allow 48 business hours for a response.  Please remember that this is for non-urgent requests.  _______________________________________________________   I appreciate the  opportunity to care for you  Thank You   [facility]

## 2023-10-26 IMAGING — CT CT CERVICAL SPINE W/O CM
3 of 4 series · 12 of 33 positions shown, 14 images · non-contrast
Comparison: None.

CLINICAL DATA: Head trauma, moderate-severe head injury; head
injury

EXAM:
CT HEAD WITHOUT CONTRAST
CT CERVICAL SPINE WITHOUT CONTRAST
TECHNIQUE: Multidetector CT imaging of the head and cervical spine was
performed following the standard protocol without intravenous
contrast. Multiplanar CT image reconstructions of the cervical spine
were also generated.

[Series 7: sag bone · sagittal · 0.24mm/px · 5 of 54 slices shown, 6 images]
[im 18/54  bone]
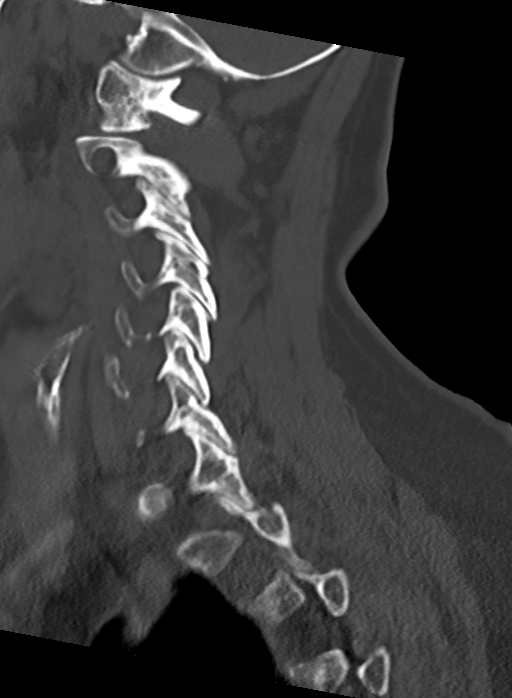
[im 23/54  bone]
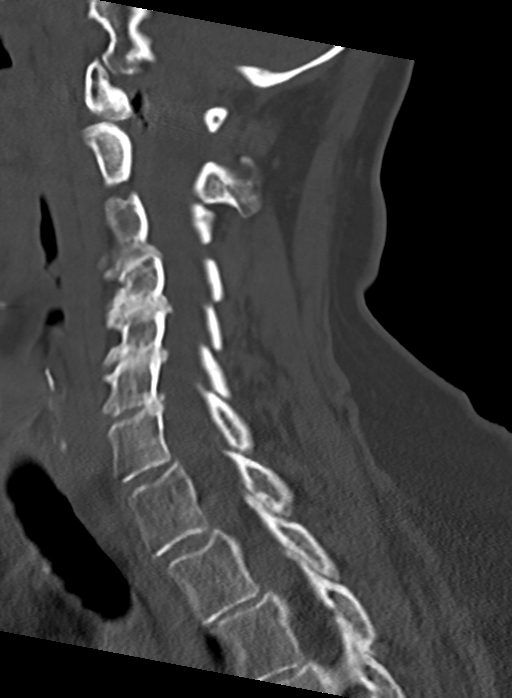
[im 27/54  soft-tissue]
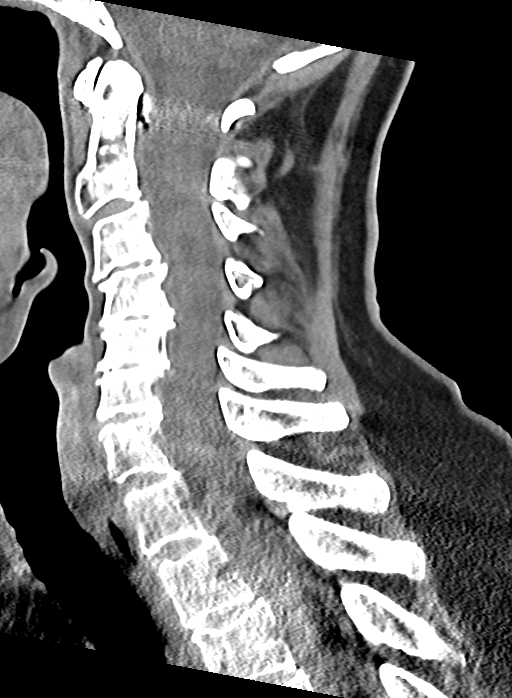
[im 27/54  bone]
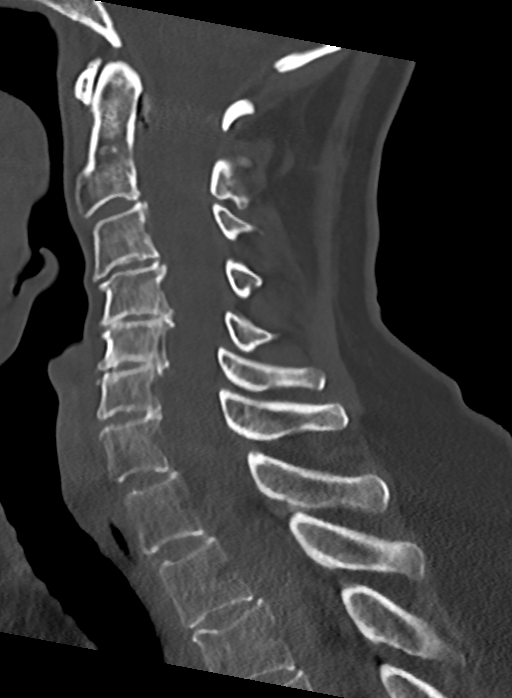
[im 31/54  bone]
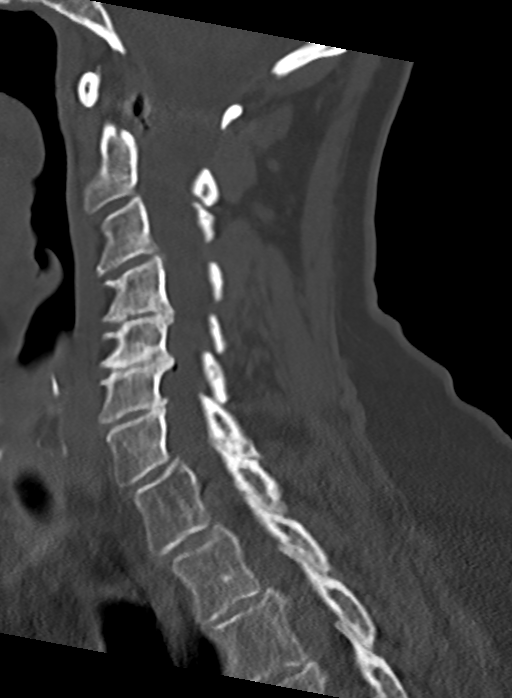
[im 36/54  bone]
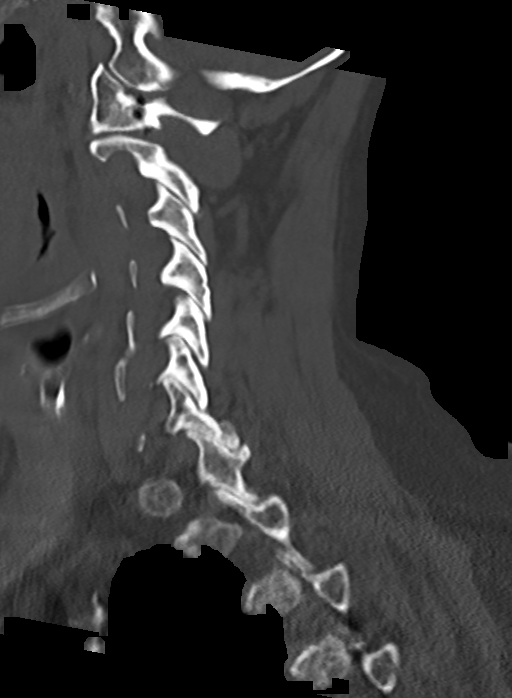

[Series 8: cor bone · coronal · 0.22mm/px · 3 of 63 slices shown]
[im 13/63  bone]
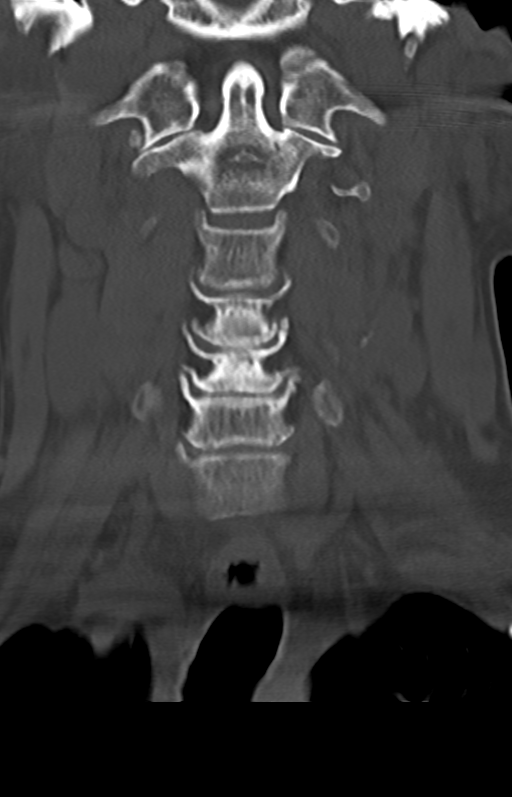
[im 25/63  bone]
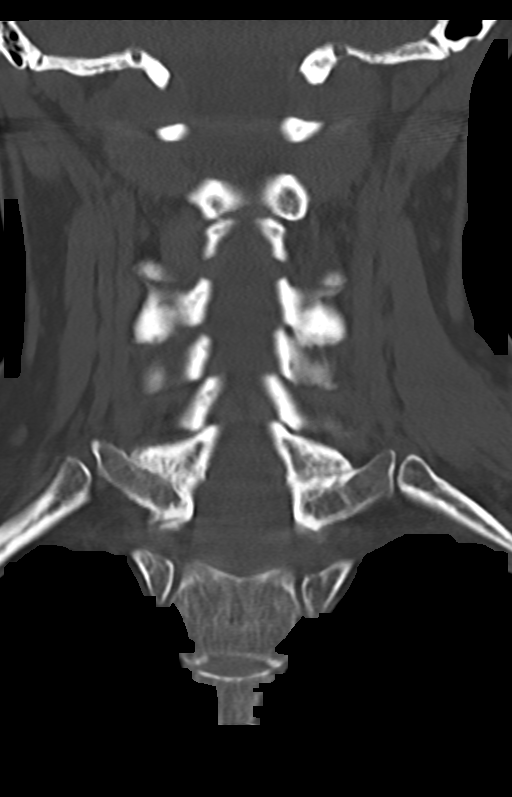
[im 38/63  bone]
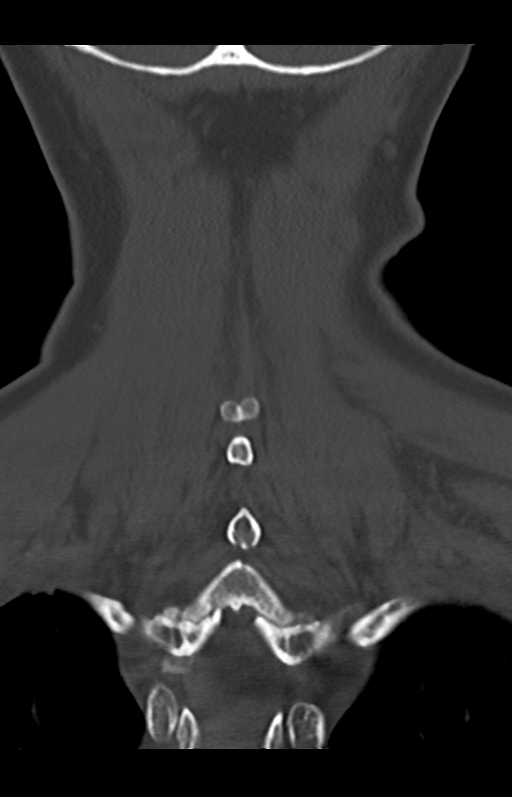

[Series 10: orthogonal axials · axial · 0.21mm/px · z∈[-289,-179]mm · 4 of 92 slices shown, 5 images]
[im 14/92  soft-tissue]
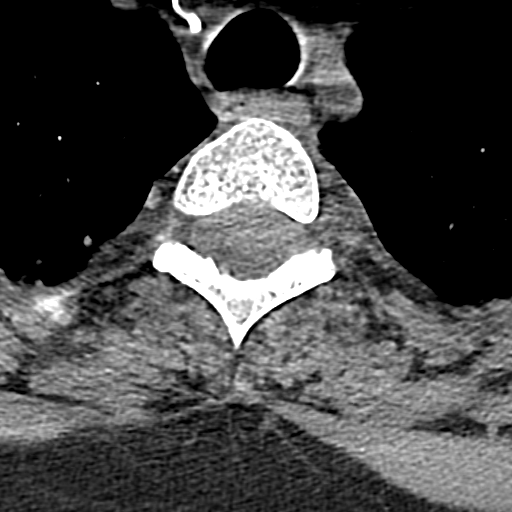
[im 14/92  bone]
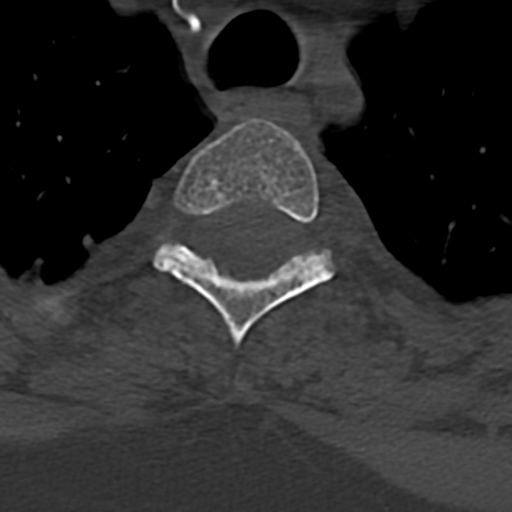
[im 40/92  bone]
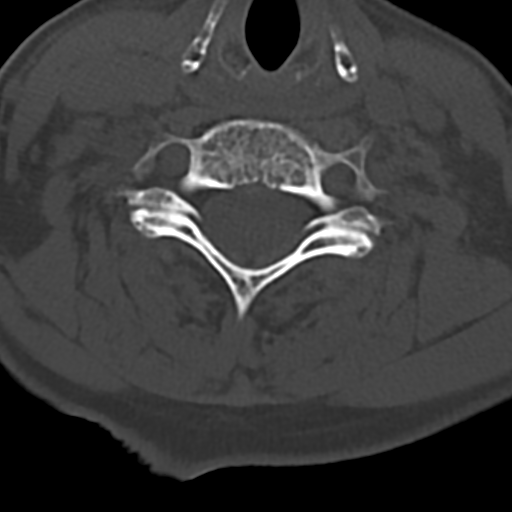
[im 53/92  bone]
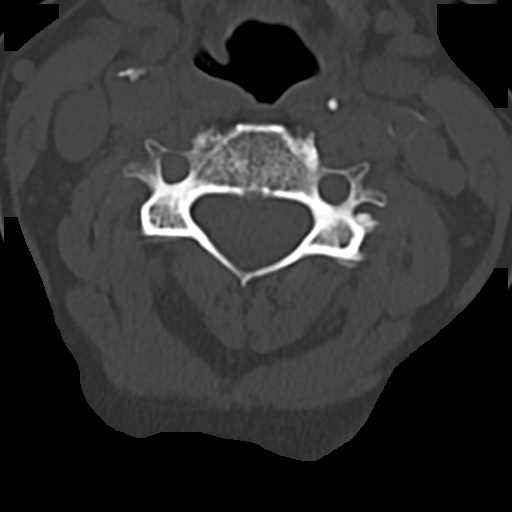
[im 79/92  bone]
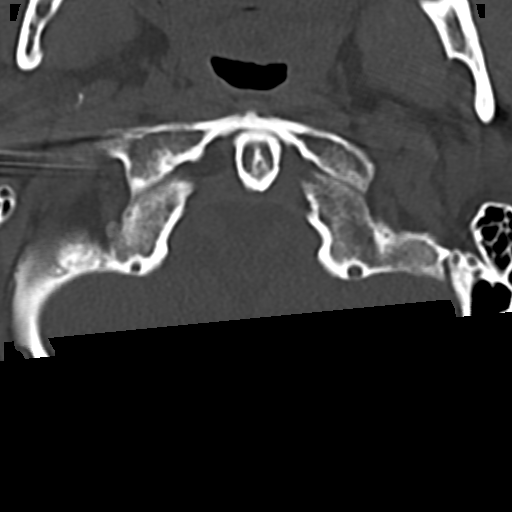

[12 of 33 positions shown; findings below may reference images not displayed]

FINDINGS: CT HEAD FINDINGS

BRAIN:
BRAIN
Cerebral ventricle sizes are concordant with the degree of cerebral
volume loss. Patchy and confluent areas of decreased attenuation are
noted throughout the deep and periventricular white matter of the
cerebral hemispheres bilaterally, compatible with chronic
microvascular ischemic disease.

No evidence of large-territorial acute infarction. No parenchymal
hemorrhage. No mass lesion. No extra-axial collection.

No mass effect or midline shift. No hydrocephalus. Basilar cisterns
are patent.

Vascular: No hyperdense vessel. Atherosclerotic calcifications are
present within the cavernous internal carotid arteries.

Skull: No acute fracture or focal lesion.

Sinuses/Orbits: Paranasal sinuses and mastoid air cells are clear.
The orbits are unremarkable.

Other: None.

CT CERVICAL SPINE FINDINGS

Alignment: Reversal of normal cervical lordosis centered at the
C4-C5 level likely due to degenerative changes and positioning.

Skull base and vertebrae: Multilevel degenerative changes of the
spine with no severe osseous central canal or neural foraminal
stenosis. No acute fracture. No aggressive appearing focal osseous
lesion or focal pathologic process.

Soft tissues and spinal canal: No prevertebral fluid or swelling. No
visible canal hematoma.

Upper chest: Unremarkable.

Other: None.
IMPRESSION: 1. No acute intracranial abnormality.
2. No acute displaced fracture or traumatic listhesis of the
cervical spine.

## 2023-10-26 IMAGING — DX DG CHEST 2V
2 series · 2 of 2 positions shown · non-contrast
Comparison: Chest x-ray 01/29/2021.

CLINICAL DATA: left lower rib pain and right clavicular pain

EXAM:
CHEST - 2 VIEW

[chest pa]
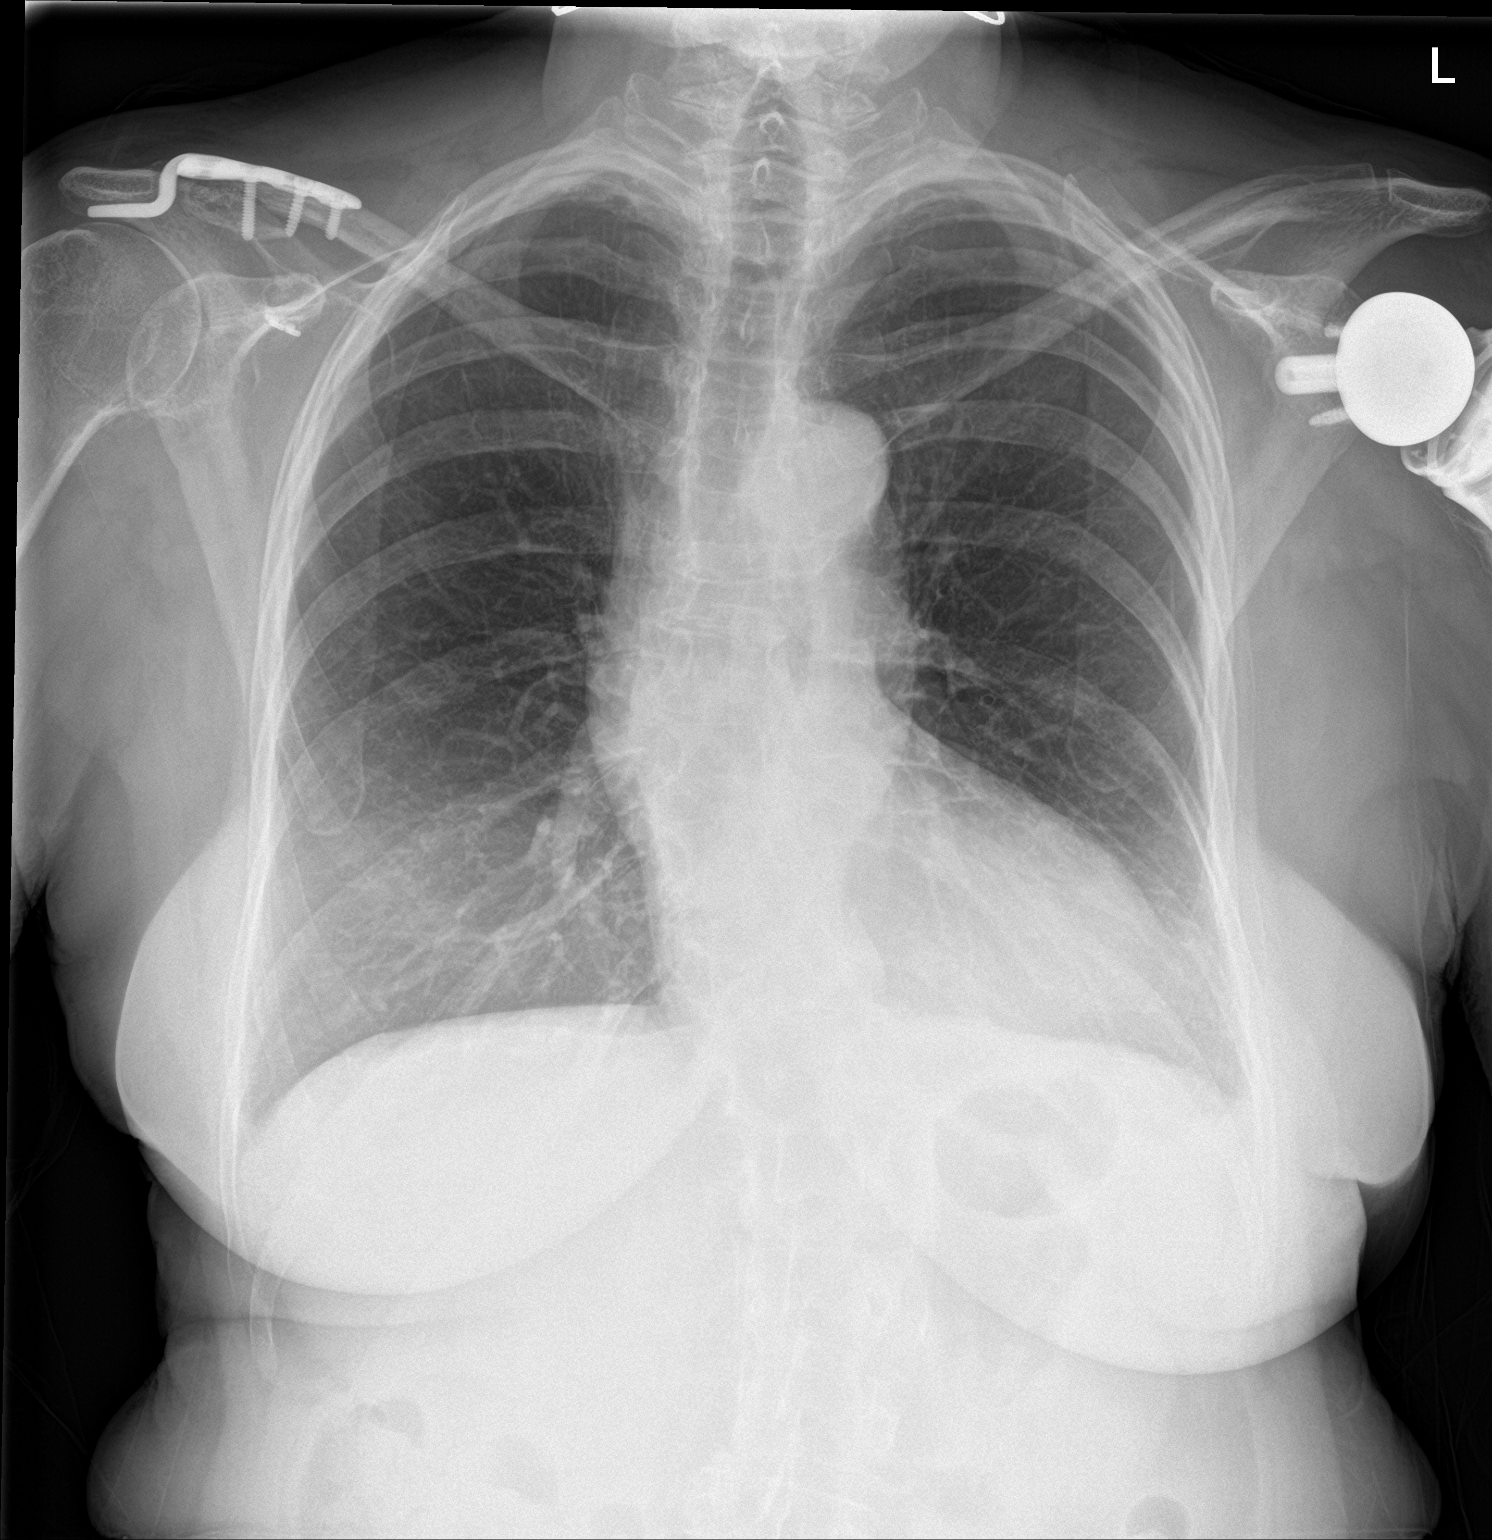

[chest lat]
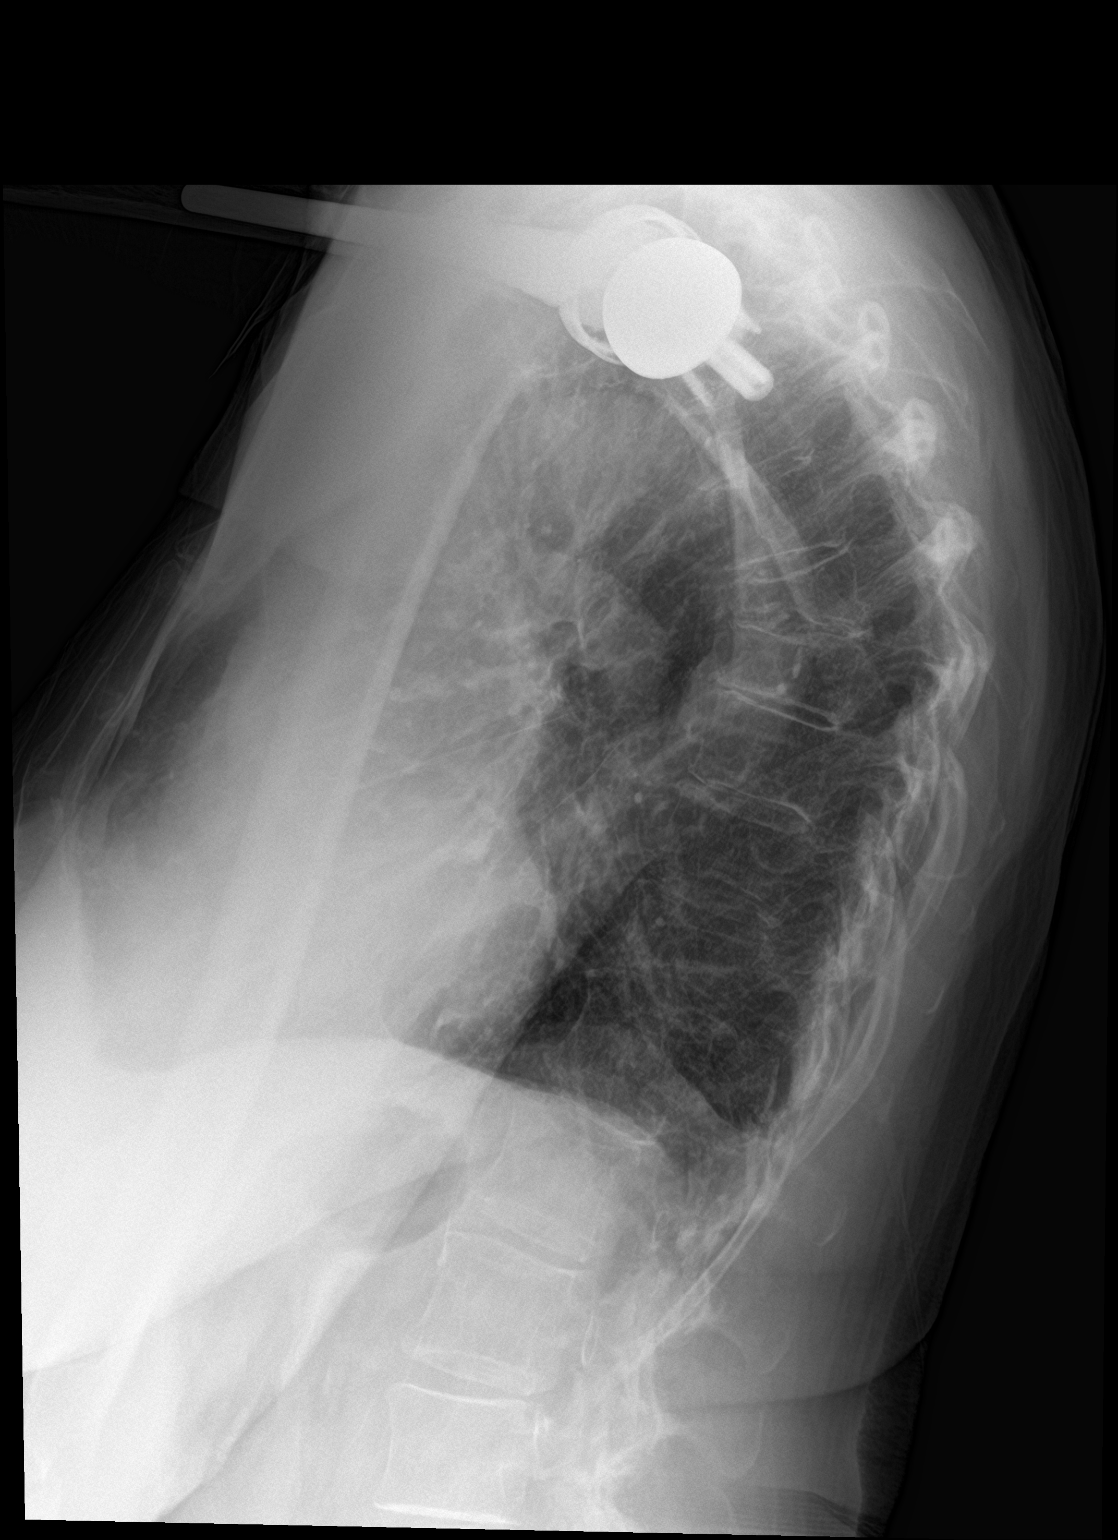

[2 of 2 positions shown; findings below may reference images not displayed]

FINDINGS: The heart and mediastinal contours are unchanged. Aortic
calcification.

Biapical pleural/pulmonary scarring. No focal consolidation. No
pulmonary edema. Nonspecific blunting of bilateral costophrenic
angles with trace pleural effusions not excluded. No pneumothorax.

No acute osseous abnormality. Chronic appearing upper thoracic spine
anterior compression fracture several other vertebral bodies
demonstrating height loss. Reversed total left shoulder
arthroplasty. Right acromioclavicular plate and screw fixation.
IMPRESSION: 1. No acute cardiopulmonary abnormality.
2. Right acromioclavicular plate and screw fixation.
3. If concern for left rib fracture, please consider dedicated rib
series.

## 2023-10-26 IMAGING — CT CT HEAD W/O CM
4 series · 16 of 47 positions shown, 18 images · non-contrast
Comparison: None.

CLINICAL DATA: Head trauma, moderate-severe head injury; head
injury

EXAM:
CT HEAD WITHOUT CONTRAST
CT CERVICAL SPINE WITHOUT CONTRAST
TECHNIQUE: Multidetector CT imaging of the head and cervical spine was
performed following the standard protocol without intravenous
contrast. Multiplanar CT image reconstructions of the cervical spine
were also generated.

[Series 3: head wo · axial · 0.42mm/px · z∈[-147,-27]mm · 7 of 32 slices shown, 9 images]
[im 4/32  brain]
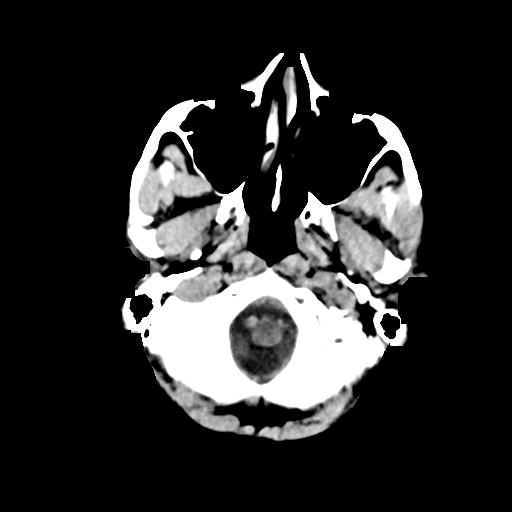
[im 4/32  bone]
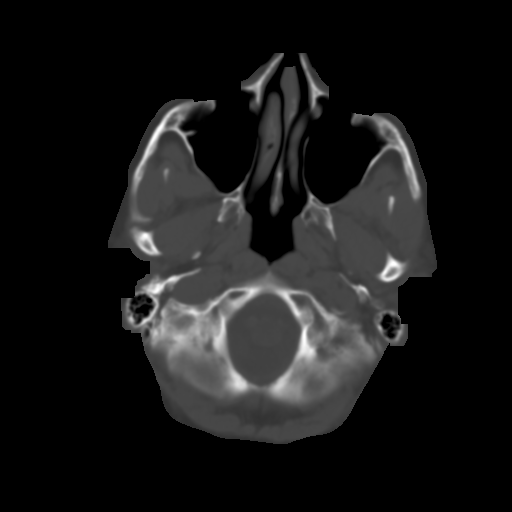
[im 8/32  brain]
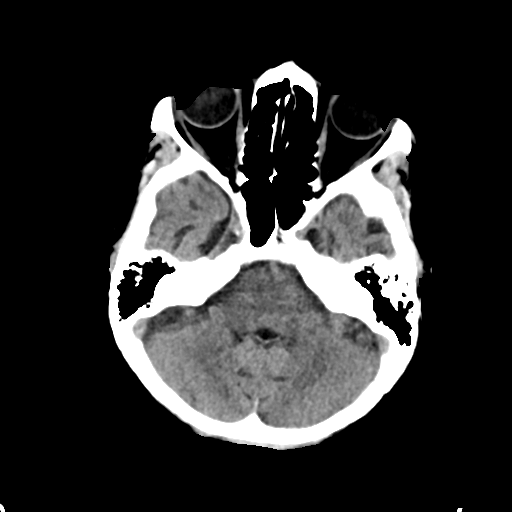
[im 12/32  brain]
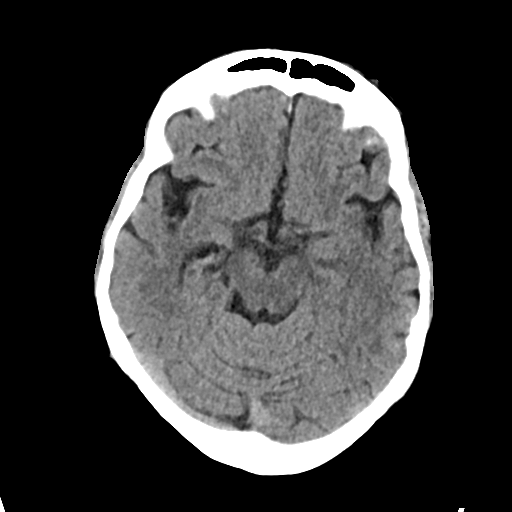
[im 16/32  brain]
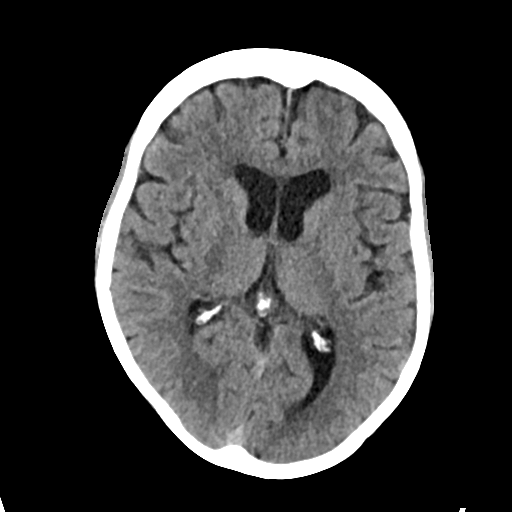
[im 20/32  brain]
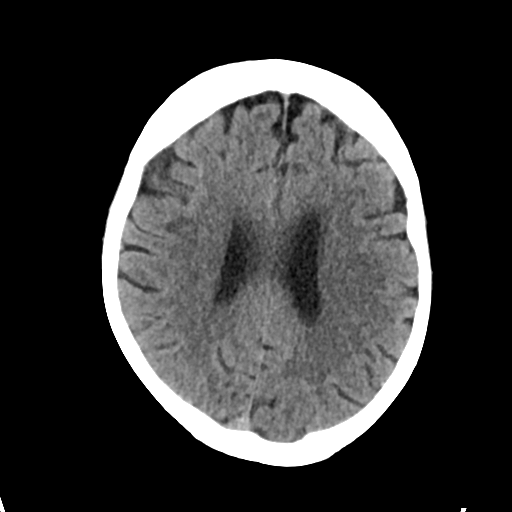
[im 20/32  bone]
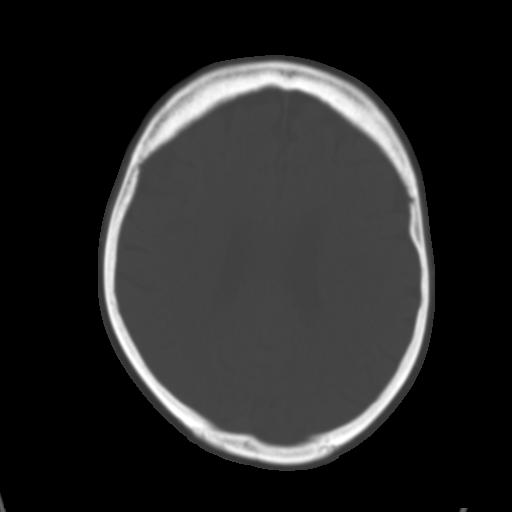
[im 24/32  brain]
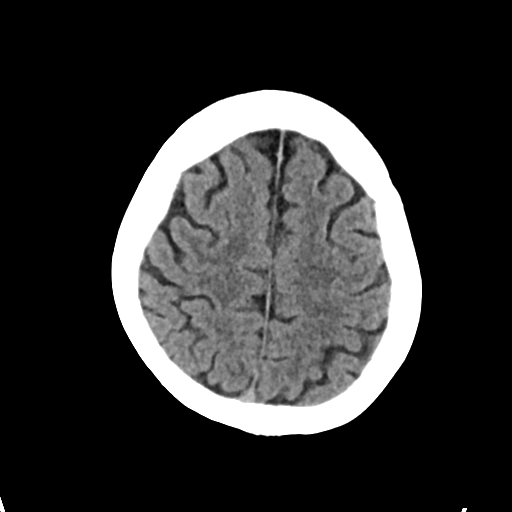
[im 28/32  brain]
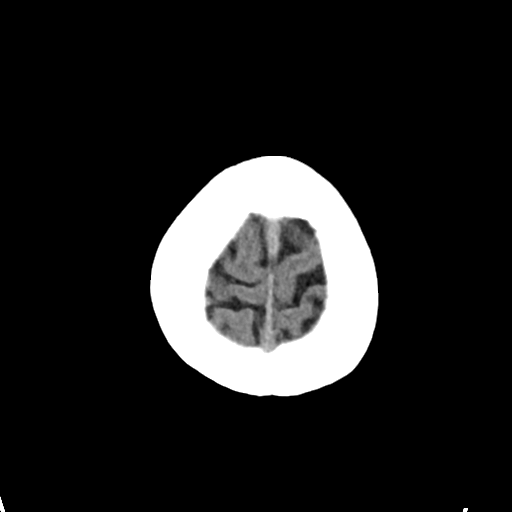

[Series 4: head bone · axial · 0.42mm/px · z∈[-148,-116]mm · 3 of 80 slices shown]
[im 8/80  bone]
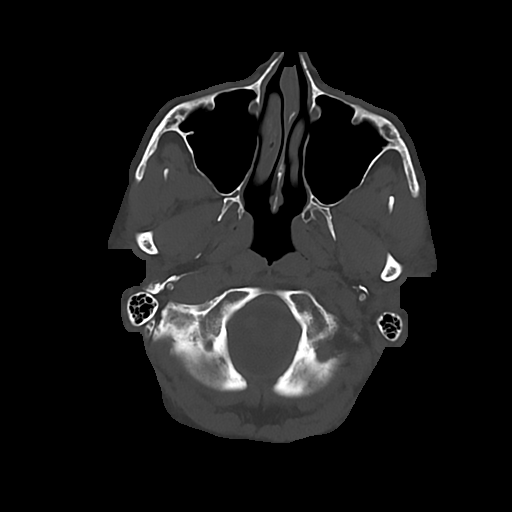
[im 16/80  bone]
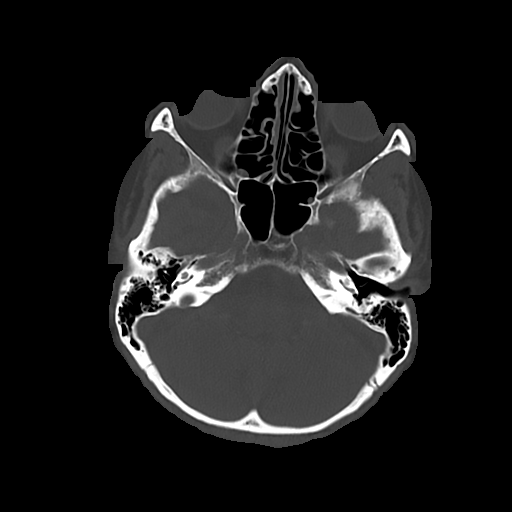
[im 24/80  bone]
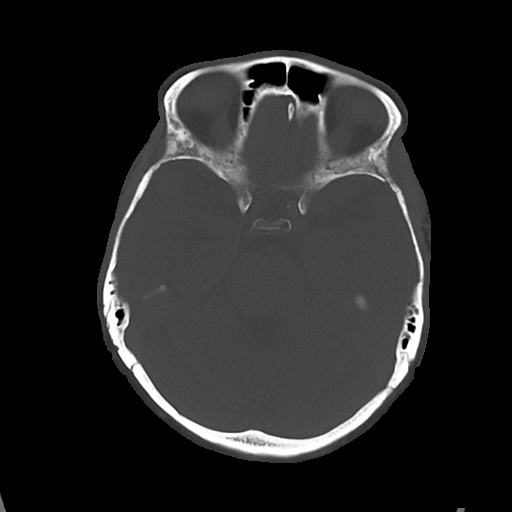

[Series 5: cor soft · coronal · 0.31mm/px · 3 of 64 slices shown]
[im 22/64  brain]
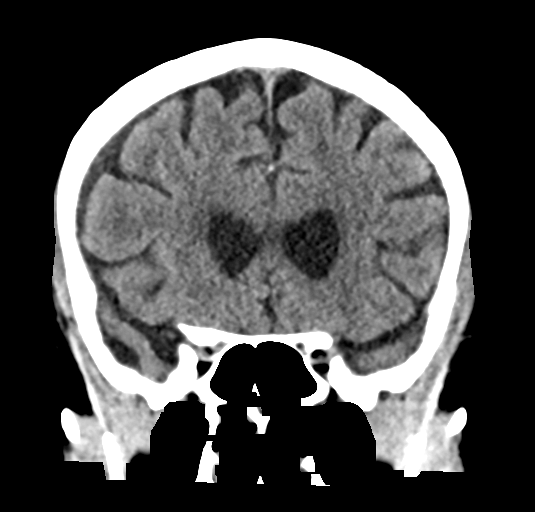
[im 29/64  brain]
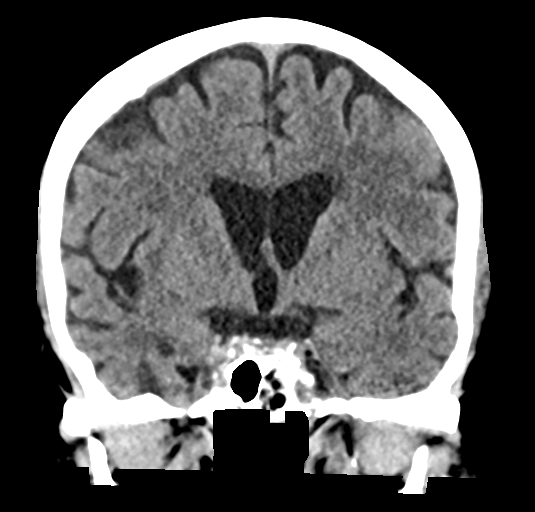
[im 36/64  brain]
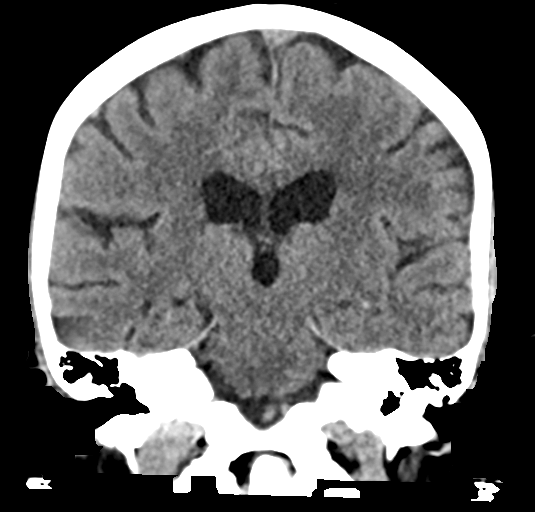

[Series 6: sag soft · sagittal · 0.31mm/px · 3 of 54 slices shown]
[im 18/54  brain]
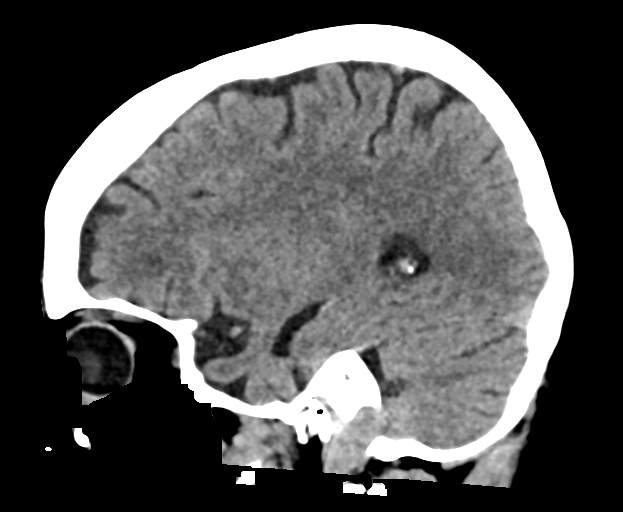
[im 27/54  brain]
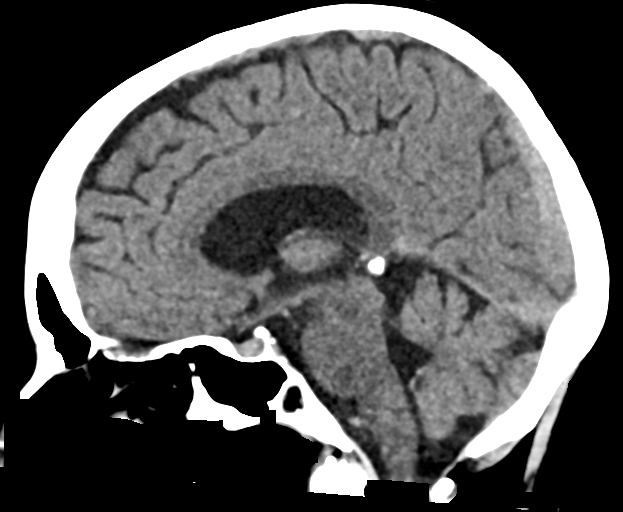
[im 36/54  brain]
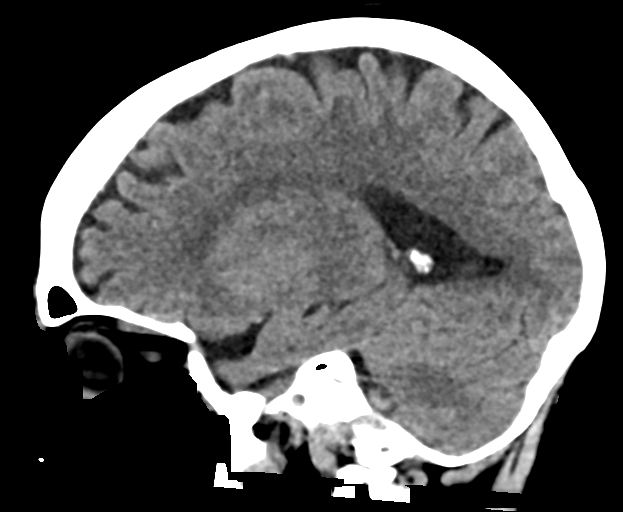

[16 of 47 positions shown; findings below may reference images not displayed]

FINDINGS: CT HEAD FINDINGS

BRAIN:
BRAIN
Cerebral ventricle sizes are concordant with the degree of cerebral
volume loss. Patchy and confluent areas of decreased attenuation are
noted throughout the deep and periventricular white matter of the
cerebral hemispheres bilaterally, compatible with chronic
microvascular ischemic disease.

No evidence of large-territorial acute infarction. No parenchymal
hemorrhage. No mass lesion. No extra-axial collection.

No mass effect or midline shift. No hydrocephalus. Basilar cisterns
are patent.

Vascular: No hyperdense vessel. Atherosclerotic calcifications are
present within the cavernous internal carotid arteries.

Skull: No acute fracture or focal lesion.

Sinuses/Orbits: Paranasal sinuses and mastoid air cells are clear.
The orbits are unremarkable.

Other: None.

CT CERVICAL SPINE FINDINGS

Alignment: Reversal of normal cervical lordosis centered at the
C4-C5 level likely due to degenerative changes and positioning.

Skull base and vertebrae: Multilevel degenerative changes of the
spine with no severe osseous central canal or neural foraminal
stenosis. No acute fracture. No aggressive appearing focal osseous
lesion or focal pathologic process.

Soft tissues and spinal canal: No prevertebral fluid or swelling. No
visible canal hematoma.

Upper chest: Unremarkable.

Other: None.
IMPRESSION: 1. No acute intracranial abnormality.
2. No acute displaced fracture or traumatic listhesis of the
cervical spine.

## 2023-10-29 DIAGNOSIS — M546 Pain in thoracic spine: Secondary | ICD-10-CM | POA: Diagnosis not present

## 2023-10-29 DIAGNOSIS — M50322 Other cervical disc degeneration at C5-C6 level: Secondary | ICD-10-CM | POA: Diagnosis not present

## 2023-10-29 DIAGNOSIS — M9901 Segmental and somatic dysfunction of cervical region: Secondary | ICD-10-CM | POA: Diagnosis not present

## 2023-10-29 DIAGNOSIS — M5451 Vertebrogenic low back pain: Secondary | ICD-10-CM | POA: Diagnosis not present

## 2023-10-29 DIAGNOSIS — M4312 Spondylolisthesis, cervical region: Secondary | ICD-10-CM | POA: Diagnosis not present

## 2023-10-29 DIAGNOSIS — M9902 Segmental and somatic dysfunction of thoracic region: Secondary | ICD-10-CM | POA: Diagnosis not present

## 2023-11-05 DIAGNOSIS — M546 Pain in thoracic spine: Secondary | ICD-10-CM | POA: Diagnosis not present

## 2023-11-05 DIAGNOSIS — M5451 Vertebrogenic low back pain: Secondary | ICD-10-CM | POA: Diagnosis not present

## 2023-11-06 ENCOUNTER — Telehealth: Payer: Self-pay | Admitting: Gastroenterology

## 2023-11-06 DIAGNOSIS — M9902 Segmental and somatic dysfunction of thoracic region: Secondary | ICD-10-CM | POA: Diagnosis not present

## 2023-11-06 DIAGNOSIS — M4312 Spondylolisthesis, cervical region: Secondary | ICD-10-CM | POA: Diagnosis not present

## 2023-11-06 DIAGNOSIS — M50322 Other cervical disc degeneration at C5-C6 level: Secondary | ICD-10-CM | POA: Diagnosis not present

## 2023-11-06 DIAGNOSIS — M9901 Segmental and somatic dysfunction of cervical region: Secondary | ICD-10-CM | POA: Diagnosis not present

## 2023-11-06 NOTE — Telephone Encounter (Signed)
 Spoke with pt and reviewed instructions for bowel purge with her, also sent them to her mychart. Pt verbalized understanding.

## 2023-11-06 NOTE — Telephone Encounter (Signed)
 Patient called and stated that based off her last OV she was suppose to get instructions on how she needed to purge. Patient is requesting a call back.Please advise.

## 2023-11-10 DIAGNOSIS — M5451 Vertebrogenic low back pain: Secondary | ICD-10-CM | POA: Diagnosis not present

## 2023-11-10 DIAGNOSIS — M546 Pain in thoracic spine: Secondary | ICD-10-CM | POA: Diagnosis not present

## 2023-11-18 ENCOUNTER — Ambulatory Visit: Payer: BC Managed Care – PPO | Admitting: Physician Assistant

## 2023-11-25 ENCOUNTER — Other Ambulatory Visit: Payer: Self-pay | Admitting: Physician Assistant

## 2023-11-25 DIAGNOSIS — G4700 Insomnia, unspecified: Secondary | ICD-10-CM

## 2023-11-25 NOTE — Telephone Encounter (Signed)
 Pt requesting refill for Zolpidem  10 mg tablet. Last OV 09/30/2023.

## 2023-11-27 DIAGNOSIS — M50322 Other cervical disc degeneration at C5-C6 level: Secondary | ICD-10-CM | POA: Diagnosis not present

## 2023-11-27 DIAGNOSIS — M4312 Spondylolisthesis, cervical region: Secondary | ICD-10-CM | POA: Diagnosis not present

## 2023-11-27 DIAGNOSIS — M9902 Segmental and somatic dysfunction of thoracic region: Secondary | ICD-10-CM | POA: Diagnosis not present

## 2023-11-27 DIAGNOSIS — M9901 Segmental and somatic dysfunction of cervical region: Secondary | ICD-10-CM | POA: Diagnosis not present

## 2023-11-28 DIAGNOSIS — M546 Pain in thoracic spine: Secondary | ICD-10-CM | POA: Diagnosis not present

## 2023-11-28 DIAGNOSIS — M5451 Vertebrogenic low back pain: Secondary | ICD-10-CM | POA: Diagnosis not present

## 2023-12-05 ENCOUNTER — Other Ambulatory Visit: Payer: Self-pay | Admitting: Physician Assistant

## 2023-12-05 DIAGNOSIS — Z1231 Encounter for screening mammogram for malignant neoplasm of breast: Secondary | ICD-10-CM

## 2023-12-11 DIAGNOSIS — M4312 Spondylolisthesis, cervical region: Secondary | ICD-10-CM | POA: Diagnosis not present

## 2023-12-11 DIAGNOSIS — M9901 Segmental and somatic dysfunction of cervical region: Secondary | ICD-10-CM | POA: Diagnosis not present

## 2023-12-11 DIAGNOSIS — M9902 Segmental and somatic dysfunction of thoracic region: Secondary | ICD-10-CM | POA: Diagnosis not present

## 2023-12-11 DIAGNOSIS — M50322 Other cervical disc degeneration at C5-C6 level: Secondary | ICD-10-CM | POA: Diagnosis not present

## 2023-12-12 ENCOUNTER — Ambulatory Visit (INDEPENDENT_AMBULATORY_CARE_PROVIDER_SITE_OTHER): Admitting: Physician Assistant

## 2023-12-12 ENCOUNTER — Encounter: Payer: Self-pay | Admitting: Physician Assistant

## 2023-12-12 VITALS — BP 108/70 | HR 72 | Temp 98.1°F | Ht 67.0 in | Wt 136.0 lb

## 2023-12-12 DIAGNOSIS — K589 Irritable bowel syndrome without diarrhea: Secondary | ICD-10-CM | POA: Diagnosis not present

## 2023-12-12 DIAGNOSIS — E88819 Insulin resistance, unspecified: Secondary | ICD-10-CM

## 2023-12-12 DIAGNOSIS — G4700 Insomnia, unspecified: Secondary | ICD-10-CM | POA: Diagnosis not present

## 2023-12-12 DIAGNOSIS — N958 Other specified menopausal and perimenopausal disorders: Secondary | ICD-10-CM

## 2023-12-12 DIAGNOSIS — I10 Essential (primary) hypertension: Secondary | ICD-10-CM | POA: Diagnosis not present

## 2023-12-12 NOTE — Patient Instructions (Addendum)
 It was great to see you!  Vaginal cream (Estrace):  Note: May also be applied directly to vulvar and vestibular tissues. Dosing is provided in grams of cream; 1 g of cream contains 0.1 mg of estradiol.  Initial: 0.5 to 1 g once daily for 2 weeks  Maintenance: 0.5 to 1 g one to three times per week   Let's follow-up in 6 months, sooner if you have concerns.  Take care,  Alexander Iba PA-C

## 2023-12-12 NOTE — Progress Notes (Signed)
 Emma Swanson is a 83 y.o. female here for a follow up of a pre-existing problem.  History of Present Illness:   Chief Complaint  Patient presents with   Hypertension    Pt seen in office today for hypertension f/u;    Hypertension: Currently on Lisinopril -HCTZ 10-12.5 mg once daily.  Reports good compliance and tolerance of antihypertensive.  No acute concerns reported today.  Denies any lightheadedness or dizziness.   Abdominal bloating // Constipation // IBS Pt continues to experience abdominal bloating and constipation.  She states her abdomen often feels "hard as a rock".  Last followed up with GI on 10/22/2023. She was recommended to mix Miralax  with Gatorade to manage symptoms, which has not helped as much as Imodium .  Recently, she stopped using the Miralax /Gatorade and resumed Imodium .  Her mother had a hx of GI issues, which her father has disclosed to her despite her best efforts. She suspects her mother's GI issues contributed to her passing.   Insomnia: Pt taking Ambien  10 mg as needed. Overall she is tolerating this well.  No acute concerns.   GSM She has recently become sexually active and has started vaginal estrace from her gynecology She wants to make sure she understands dosing/frequency  Past Medical History:  Diagnosis Date   Arthritis    Asthma    Cancer (HCC)    basal cells   Colon polyps    Family history of adverse reaction to anesthesia    see malignant hyperthermia tab    GERD (gastroesophageal reflux disease)    Hypertension    Hypothyroidism    Malignant hyperthermia    cousin's son- - malignant hyperthermia al most died  ,  patient's brother and his 2 sons tested positive for malignant hyperthermia   Palpitations    PONV (postoperative nausea and vomiting)    Vitamin D  deficiency      Social History   Tobacco Use   Smoking status: Former    Types: Cigarettes   Smokeless tobacco: Never   Tobacco comments:    age of 33 until age  83   Vaping Use   Vaping status: Never Used  Substance Use Topics   Alcohol  use: Yes    Comment: 1-3 glasses of wine per night   Drug use: Never    Past Surgical History:  Procedure Laterality Date   AUGMENTATION MAMMAPLASTY Bilateral    bilateral arthroscopic knee surgery      bone spur removal      BREAST BIOPSY     CLAVICLE SURGERY Right    COLONOSCOPY     left ankle surgery - had pins      RESECTION DISTAL CLAVICAL Right 11/08/2021   thyroid  surgery - age 32      TOTAL KNEE ARTHROPLASTY Right 11/25/2017   Procedure: RIGHT TOTAL KNEE ARTHROPLASTY;  Surgeon: Claiborne Crew, MD;  Location: [facility];  Service: Orthopedics;  Laterality: Right;  70 mins   TOTAL KNEE ARTHROPLASTY Left 04/16/2018   Procedure: LEFT TOTAL KNEE ARTHROPLASTY;  Surgeon: Claiborne Crew, MD;  Location: [facility];  Service: Orthopedics;  Laterality: Left;  90 mins   TOTAL SHOULDER ARTHROPLASTY Left 02/10/2020   Procedure: TOTAL SHOULDER ARTHROPLASTY;  Surgeon: Ellard Gunning, MD;  Location: [facility];  Service: Orthopedics;  Laterality: Left;     Family History  Problem Relation Age of Onset   Cancer Mother    Other Mother        tumors removed from intestines - uncertain etiology  Stroke Father    Hypertension Brother    Diabetes Brother    Heart disease Brother    Malignant hyperthermia Brother    Pancreatic cancer Paternal Aunt    Cancer Other    Pancreatic cancer Other    Malignant hyperthermia Other    Colon cancer Neg Hx    Esophageal cancer Neg Hx    Stomach cancer Neg Hx     Allergies  Allergen Reactions   Other     general anesthesia - pt's family has history of malignant hyperthermia   Oxycodone     Hallucinations    Penicillins     Childhood reaction Has patient had a PCN reaction causing immediate rash, facial/tongue/throat swelling, SOB or lightheadedness with hypotension: Unknown Has patient had a PCN reaction causing severe rash involving mucus membranes or skin necrosis:  Unknown Has patient had a PCN reaction that required hospitalization: Unknown Has patient had a PCN reaction occurring within the last 10 years: No  Tolerated ancef  02/10/2020     Current Medications:   Current Outpatient Medications:    alendronate  (FOSAMAX ) 70 MG tablet, TAKE 1 TABLET (70 MG TOTAL) BY MOUTH EVERY 7 DAYS WITH FULL GLASS WATER  ON EMPTY STOMACH, Disp: 4 tablet, Rfl: 11   cholecalciferol (VITAMIN D3) 25 MCG (1000 UNIT) tablet, Take 5,000 Units by mouth daily., Disp: , Rfl:    clobetasol ointment (TEMOVATE) 0.05 %, Apply topically 2 (two) times daily., Disp: , Rfl:    estradiol (ESTRACE) 0.1 MG/GM vaginal cream, Place 1 Applicatorful vaginally 2 (two) times a week., Disp: , Rfl:    ipratropium (ATROVENT ) 0.06 % nasal spray, USE 2 SPRAYS IN EACH NOSTRIL TWICE DAILY, Disp: 15 mL, Rfl: 1   lisinopril -hydrochlorothiazide  (ZESTORETIC ) 10-12.5 MG tablet, Take 1 tablet by mouth daily., Disp: 90 tablet, Rfl: 1   loperamide  (IMODIUM  A-D) 2 MG tablet, Take 1 tablet (2 mg total) by mouth in the morning. Increase to twice a day if needed, Disp: 30 tablet, Rfl: 0   Multiple Vitamin (MULTIVITAMIN WITH MINERALS) TABS tablet, Take 1 tablet by mouth daily., Disp: , Rfl:    Peppermint Oil (IBGARD) 90 MG CPCR, prn, Disp: , Rfl:    RESTASIS 0.05 % ophthalmic emulsion, Place 1 drop into both eyes 2 (two) times daily., Disp: , Rfl:    triamcinolone  cream (KENALOG ) 0.1 %, Apply 1 Application topically 2 (two) times daily., Disp: 30 g, Rfl: 0   zolpidem  (AMBIEN ) 10 MG tablet, TAKE 1 TABLET BY MOUTH EVERYDAY AT BEDTIME, Disp: 30 tablet, Rfl: 1   Review of Systems:   Negative unless otherwise specified per HPI.  Vitals:   Vitals:   12/12/23 1413  BP: 108/70  Pulse: 72  Temp: 98.1 F (36.7 C)  TempSrc: Temporal  SpO2: 96%  Weight: 136 lb (61.7 kg)  Height: 5\' 7"  (1.702 m)     Body mass index is 21.3 kg/m.  Physical Exam:   Physical Exam Vitals and nursing note reviewed.   Constitutional:      General: She is not in acute distress.    Appearance: She is well-developed. She is not ill-appearing or toxic-appearing.  Cardiovascular:     Rate and Rhythm: Normal rate and regular rhythm.     Pulses: Normal pulses.     Heart sounds: Normal heart sounds, S1 normal and S2 normal.  Pulmonary:     Effort: Pulmonary effort is normal.     Breath sounds: Normal breath sounds.  Skin:    General: Skin  is warm and dry.  Neurological:     Mental Status: She is alert.     GCS: GCS eye subscore is 4. GCS verbal subscore is 5. GCS motor subscore is 6.  Psychiatric:        Speech: Speech normal.        Behavior: Behavior normal. Behavior is cooperative.     Assessment and Plan:   1. Primary hypertension (Primary) Normotensive Continue lisinopril -hydrochloroTHIAZIDE  10-12.5 mg daily Follow up in 6 month(s), sooner if concerns - CBC with Differential/Platelet; Future - Comprehensive metabolic panel with GFR; Future - CBC with Differential/Platelet - Comprehensive metabolic panel with GFR  2. Insomnia, unspecified type PDMP reviewed during this encounter. Risks and benefits/side effect(s) discussed She does not feel like she can decrease dosage of Ambien  10 mg daily at this time Follow up in 6 month(s), sooner if concerns - TSH; Future - TSH  3. Insulin resistance Update and provide recommendations  - Hemoglobin A1c; Future - Hemoglobin A1c  4. Irritable bowel syndrome, unspecified type Reviewed, continue management per gastroenterology   5. Genitourinary syndrome of menopause Provided the following info on AVS Vaginal cream (Estrace):  Note: May also be applied directly to vulvar and vestibular tissues. Dosing is provided in grams of cream; 1 g of cream contains 0.1 mg of estradiol.  Initial: 0.5 to 1 g once daily for 2 weeks  Maintenance: 0.5 to 1 g one to three times per week   I, Bernita Bristle, acting as a Neurosurgeon for [employer], [place]., have  documented all relevant documentation on the behalf of Alexander Iba, [place], as directed by   while in the presence of Alexander Iba, [place].  I, Alexander Iba, [place], have reviewed all documentation for this visit. The documentation on 12/12/23 for the exam, diagnosis, procedures, and orders are all accurate and complete.  Alexander Iba, PA-C

## 2023-12-13 LAB — CBC WITH DIFFERENTIAL/PLATELET
Absolute Lymphocytes: 1901 {cells}/uL (ref 850–3900)
Absolute Monocytes: 632 {cells}/uL (ref 200–950)
Basophils Absolute: 38 {cells}/uL (ref 0–200)
Basophils Relative: 0.7 %
Eosinophils Absolute: 103 {cells}/uL (ref 15–500)
Eosinophils Relative: 1.9 %
HCT: 40.5 % (ref 35.0–45.0)
Hemoglobin: 13.4 g/dL (ref 11.7–15.5)
MCH: 31.6 pg (ref 27.0–33.0)
MCHC: 33.1 g/dL (ref 32.0–36.0)
MCV: 95.5 fL (ref 80.0–100.0)
MPV: 8.8 fL (ref 7.5–12.5)
Monocytes Relative: 11.7 %
Neutro Abs: 2727 {cells}/uL (ref 1500–7800)
Neutrophils Relative %: 50.5 %
Platelets: 279 10*3/uL (ref 140–400)
RBC: 4.24 10*6/uL (ref 3.80–5.10)
RDW: 12.9 % (ref 11.0–15.0)
Total Lymphocyte: 35.2 %
WBC: 5.4 10*3/uL (ref 3.8–10.8)

## 2023-12-13 LAB — HEMOGLOBIN A1C
Hgb A1c MFr Bld: 5.3 % (ref <5.7)
Mean Plasma Glucose: 105 mg/dL
eAG (mmol/L): 5.8 mmol/L

## 2023-12-13 LAB — COMPREHENSIVE METABOLIC PANEL WITH GFR
AG Ratio: 1.7 (calc) (ref 1.0–2.5)
ALT: 16 U/L (ref 6–29)
AST: 16 U/L (ref 10–35)
Albumin: 4.3 g/dL (ref 3.6–5.1)
Alkaline phosphatase (APISO): 68 U/L (ref 37–153)
BUN/Creatinine Ratio: 25 (calc) — ABNORMAL HIGH (ref 6–22)
BUN: 24 mg/dL (ref 7–25)
CO2: 23 mmol/L (ref 20–32)
Calcium: 9.9 mg/dL (ref 8.6–10.4)
Chloride: 102 mmol/L (ref 98–110)
Creat: 0.97 mg/dL — ABNORMAL HIGH (ref 0.60–0.95)
Globulin: 2.6 g/dL (ref 1.9–3.7)
Glucose, Bld: 78 mg/dL (ref 65–99)
Potassium: 4.3 mmol/L (ref 3.5–5.3)
Sodium: 141 mmol/L (ref 135–146)
Total Bilirubin: 0.7 mg/dL (ref 0.2–1.2)
Total Protein: 6.9 g/dL (ref 6.1–8.1)
eGFR: 59 mL/min/{1.73_m2} — ABNORMAL LOW (ref >=60)

## 2023-12-13 LAB — TSH: TSH: 0.48 m[IU]/L (ref 0.40–4.50)

## 2023-12-15 ENCOUNTER — Ambulatory Visit: Payer: Self-pay | Admitting: Physician Assistant

## 2023-12-25 ENCOUNTER — Ambulatory Visit: Admitting: Gastroenterology

## 2023-12-25 DIAGNOSIS — M4312 Spondylolisthesis, cervical region: Secondary | ICD-10-CM | POA: Diagnosis not present

## 2023-12-25 DIAGNOSIS — M50322 Other cervical disc degeneration at C5-C6 level: Secondary | ICD-10-CM | POA: Diagnosis not present

## 2023-12-25 DIAGNOSIS — M9901 Segmental and somatic dysfunction of cervical region: Secondary | ICD-10-CM | POA: Diagnosis not present

## 2023-12-25 DIAGNOSIS — M9902 Segmental and somatic dysfunction of thoracic region: Secondary | ICD-10-CM | POA: Diagnosis not present

## 2023-12-26 DIAGNOSIS — M5451 Vertebrogenic low back pain: Secondary | ICD-10-CM | POA: Diagnosis not present

## 2023-12-26 DIAGNOSIS — M546 Pain in thoracic spine: Secondary | ICD-10-CM | POA: Diagnosis not present

## 2023-12-30 ENCOUNTER — Ambulatory Visit

## 2024-01-01 DIAGNOSIS — M9902 Segmental and somatic dysfunction of thoracic region: Secondary | ICD-10-CM | POA: Diagnosis not present

## 2024-01-01 DIAGNOSIS — M9901 Segmental and somatic dysfunction of cervical region: Secondary | ICD-10-CM | POA: Diagnosis not present

## 2024-01-01 DIAGNOSIS — M4312 Spondylolisthesis, cervical region: Secondary | ICD-10-CM | POA: Diagnosis not present

## 2024-01-01 DIAGNOSIS — M50322 Other cervical disc degeneration at C5-C6 level: Secondary | ICD-10-CM | POA: Diagnosis not present

## 2024-01-26 ENCOUNTER — Other Ambulatory Visit: Payer: Self-pay | Admitting: Physician Assistant

## 2024-01-26 DIAGNOSIS — M546 Pain in thoracic spine: Secondary | ICD-10-CM | POA: Diagnosis not present

## 2024-01-26 DIAGNOSIS — M5451 Vertebrogenic low back pain: Secondary | ICD-10-CM | POA: Diagnosis not present

## 2024-01-26 DIAGNOSIS — G4700 Insomnia, unspecified: Secondary | ICD-10-CM

## 2024-01-29 DIAGNOSIS — M4312 Spondylolisthesis, cervical region: Secondary | ICD-10-CM | POA: Diagnosis not present

## 2024-01-29 DIAGNOSIS — M9901 Segmental and somatic dysfunction of cervical region: Secondary | ICD-10-CM | POA: Diagnosis not present

## 2024-01-29 DIAGNOSIS — M50322 Other cervical disc degeneration at C5-C6 level: Secondary | ICD-10-CM | POA: Diagnosis not present

## 2024-01-29 DIAGNOSIS — M9902 Segmental and somatic dysfunction of thoracic region: Secondary | ICD-10-CM | POA: Diagnosis not present

## 2024-01-29 NOTE — Telephone Encounter (Signed)
 Pt requesting refill for Zolpidem  10 mg tablet. Last OV 12/12/2023.

## 2024-02-04 ENCOUNTER — Ambulatory Visit: Admitting: Nurse Practitioner

## 2024-02-17 DIAGNOSIS — M50322 Other cervical disc degeneration at C5-C6 level: Secondary | ICD-10-CM | POA: Diagnosis not present

## 2024-02-17 DIAGNOSIS — M9902 Segmental and somatic dysfunction of thoracic region: Secondary | ICD-10-CM | POA: Diagnosis not present

## 2024-02-17 DIAGNOSIS — M4312 Spondylolisthesis, cervical region: Secondary | ICD-10-CM | POA: Diagnosis not present

## 2024-02-17 DIAGNOSIS — M9901 Segmental and somatic dysfunction of cervical region: Secondary | ICD-10-CM | POA: Diagnosis not present

## 2024-02-17 DIAGNOSIS — M545 Low back pain, unspecified: Secondary | ICD-10-CM | POA: Diagnosis not present

## 2024-02-18 ENCOUNTER — Ambulatory Visit
Admission: RE | Admit: 2024-02-18 | Discharge: 2024-02-18 | Disposition: A | Discharge status: Home / Self Care | Source: Ambulatory Visit | Attending: Physician Assistant

## 2024-02-18 DIAGNOSIS — H04123 Dry eye syndrome of bilateral lacrimal glands: Secondary | ICD-10-CM | POA: Diagnosis not present

## 2024-02-18 DIAGNOSIS — H35371 Puckering of macula, right eye: Secondary | ICD-10-CM | POA: Diagnosis not present

## 2024-02-18 DIAGNOSIS — Z1231 Encounter for screening mammogram for malignant neoplasm of breast: Secondary | ICD-10-CM

## 2024-02-18 DIAGNOSIS — H02886 Meibomian gland dysfunction of left eye, unspecified eyelid: Secondary | ICD-10-CM | POA: Diagnosis not present

## 2024-02-18 DIAGNOSIS — H02883 Meibomian gland dysfunction of right eye, unspecified eyelid: Secondary | ICD-10-CM | POA: Diagnosis not present

## 2024-02-18 DIAGNOSIS — H35363 Drusen (degenerative) of macula, bilateral: Secondary | ICD-10-CM | POA: Diagnosis not present

## 2024-02-18 LAB — HM DIABETES EYE EXAM

## 2024-02-19 DIAGNOSIS — M19042 Primary osteoarthritis, left hand: Secondary | ICD-10-CM | POA: Diagnosis not present

## 2024-02-19 DIAGNOSIS — M19041 Primary osteoarthritis, right hand: Secondary | ICD-10-CM | POA: Diagnosis not present

## 2024-02-24 DIAGNOSIS — M9901 Segmental and somatic dysfunction of cervical region: Secondary | ICD-10-CM | POA: Diagnosis not present

## 2024-02-24 DIAGNOSIS — M4312 Spondylolisthesis, cervical region: Secondary | ICD-10-CM | POA: Diagnosis not present

## 2024-02-24 DIAGNOSIS — M50322 Other cervical disc degeneration at C5-C6 level: Secondary | ICD-10-CM | POA: Diagnosis not present

## 2024-02-24 DIAGNOSIS — M9902 Segmental and somatic dysfunction of thoracic region: Secondary | ICD-10-CM | POA: Diagnosis not present

## 2024-02-26 DIAGNOSIS — M5459 Other low back pain: Secondary | ICD-10-CM | POA: Diagnosis not present

## 2024-03-01 DIAGNOSIS — Z01419 Encounter for gynecological examination (general) (routine) without abnormal findings: Secondary | ICD-10-CM | POA: Diagnosis not present

## 2024-03-02 DIAGNOSIS — M9902 Segmental and somatic dysfunction of thoracic region: Secondary | ICD-10-CM | POA: Diagnosis not present

## 2024-03-02 DIAGNOSIS — M9901 Segmental and somatic dysfunction of cervical region: Secondary | ICD-10-CM | POA: Diagnosis not present

## 2024-03-02 DIAGNOSIS — M4312 Spondylolisthesis, cervical region: Secondary | ICD-10-CM | POA: Diagnosis not present

## 2024-03-02 DIAGNOSIS — M50322 Other cervical disc degeneration at C5-C6 level: Secondary | ICD-10-CM | POA: Diagnosis not present

## 2024-03-09 ENCOUNTER — Ambulatory Visit (INDEPENDENT_AMBULATORY_CARE_PROVIDER_SITE_OTHER): Admitting: Nurse Practitioner

## 2024-03-09 ENCOUNTER — Encounter: Payer: Self-pay | Admitting: Nurse Practitioner

## 2024-03-09 VITALS — BP 136/74 | HR 74 | Ht 66.0 in | Wt 134.5 lb

## 2024-03-09 DIAGNOSIS — F1010 Alcohol abuse, uncomplicated: Secondary | ICD-10-CM | POA: Diagnosis not present

## 2024-03-09 DIAGNOSIS — K582 Mixed irritable bowel syndrome: Secondary | ICD-10-CM

## 2024-03-09 DIAGNOSIS — R140 Abdominal distension (gaseous): Secondary | ICD-10-CM

## 2024-03-09 NOTE — Progress Notes (Unsigned)
     03/09/2024 Emma Swanson [id number] 12-01-1940   Chief Complaint:  History of Present Illness: 83 year old female history of malignant hyperthermia, hypothyroidism, hypertension, colon polyps, altered bowel habits, lymphocytic colitis, presents for evaluation of bloating and altered bowel habits  She has abdominal bloat daily x 5 to 10 years. If she does not take Imodium , she has full blown diarrhea or soft stools nonstop.   No abdominal pain, feels pregnant  She has cut back on dairy products, uses lactose free milk  Ice cream sticks on occasion   No recent antibiotic past 6 months   Maybe took a probiotic in the past   Ibgard   No specific food triggers   No N/V  Acid reflux if she brushes teeth too soon after eating   Weight up or down 5lbs   She takes Imodium  2 tabs prior to working.    Flex sig 09/23/18 - no evidence of lymphocytic colitis on path   Celiac labs TTG IgA and IgG negative, total IgA normal   Colonoscopy 10/06/2017 - 3mm rectal adenoma, otherwise biopsies diffusely showed lymphocytic colitis   CT abdomen / pelvis 06/25/20 - IMPRESSION: 1. Subtle inflammatory changes are suggested adjacent to the decompressed left colon, which would support a mild inflammatory or infectious colitis in the proper clinical setting. This portion of the bowel is not well assessed due to lack of distension. 2. No other evidence of an acute abnormality within the abdomen or pelvis. 3. Aortic atherosclerosis   Current Medications, Allergies, Past Medical History, Past Surgical History, Family History and Social History were reviewed in [employer] record.   Review of Systems:   Constitutional: Negative for fever, sweats, chills or weight loss.  Respiratory: Negative for shortness of breath.   Cardiovascular: Negative for chest pain, palpitations and leg swelling.  Gastrointestinal: See HPI.  Musculoskeletal: Negative for back pain or muscle  aches.  Neurological: Negative for dizziness, headaches or paresthesias.    Physical Exam: BP 136/74 (BP Location: Left Arm, Patient Position: Sitting, Cuff Size: Normal)   Pulse 74   Ht 5' 6 (1.676 m)   Wt 134 lb 8 oz (61 kg)   BMI 21.71 kg/m  General: in no acute distress. Head: Normocephalic and atraumatic. Eyes: No scleral icterus. Conjunctiva pink . Ears: Normal auditory acuity. Mouth: Dentition intact. No ulcers or lesions.  Lungs: Clear throughout to auscultation. Heart: Regular rate and rhythm, no murmur. Abdomen: Soft, nontender and nondistended. No masses or hepatomegaly. Normal bowel sounds x 4 quadrants.  Rectal: Deferred.  Musculoskeletal: Symmetrical with no gross deformities. Extremities: No edema. Neurological: Alert oriented x 4. No focal deficits.  Psychological: Alert and cooperative. Normal mood and affect  Assessment and Recommendations: ***

## 2024-03-09 NOTE — Patient Instructions (Addendum)
 Florastor probiotic one capsule by mouth twice daily x 4 weeks   Take Lactaid one to two tabs with any dairy product   Continue Imodium  one to two tabs daily as needed for now   Reduce alcohol  intake   Follow up with gyn to consider a pelvic ultrasound to evaluate ovaries secondary to chronic abdominal bloat   Follow up : 05/26/24 at 3:00 pm with Dr. Leigh Lanius have been given a testing kit to check for small intestine bacterial overgrowth (SIBO) which is completed by a company named [facility]. Make sure to return your test in the mail using the return mailing label given to you along with the kit. The test order, your demographic and insurance information have all already been sent to the company. [facility] will collect an upfront charge of $109.00 for commercial insurance plans and $229.00 if you are paying cash. The potential remaining total after claim submission and review is $120.00. Make sure to discuss with [facility] PRIOR to having the test to see if they have gotten information from your insurance company as to how much your testing will cost out of pocket, if any. Please contact [facility] at phone number [phone] to get instructions regarding how to perform the test as our office is unable to give specific testing instructions.   Due to recent changes in healthcare laws, you may see the results of your imaging and laboratory studies on MyChart before your provider has had a chance to review them.  We understand that in some cases there may be results that are confusing or concerning to you. Not all laboratory results come back in the same time frame and the provider may be waiting for multiple results in order to interpret others.  Please give us  48 hours in order for your provider to thoroughly review all the results before contacting the office for clarification of your results.   Thank you for choosing me and [facility].  Elida Shawl, CRNP

## 2024-03-10 ENCOUNTER — Telehealth: Payer: Self-pay | Admitting: *Deleted

## 2024-03-10 DIAGNOSIS — R197 Diarrhea, unspecified: Secondary | ICD-10-CM

## 2024-03-10 NOTE — Telephone Encounter (Signed)
 Left message to call back.

## 2024-03-10 NOTE — Progress Notes (Signed)
 Linda/Dottie, pls contact patient and send her to our lab for a fecal calprotectin level if she is willing to do. DX: lymphocytic colitis, diarrhea and constipation thx

## 2024-03-10 NOTE — Progress Notes (Signed)
 Agree with assessment and plan as outlined.  Emma Swanson in addition to your recommendations we may consider doing fecal calprotectin just to make sure negative given persistent symptoms.  Can you please add that if she is willing

## 2024-03-11 NOTE — Telephone Encounter (Signed)
 Left message to call back. Patient needs to pick up fecal calprotectin testing kit as recommended by Dr Leigh.

## 2024-03-19 DIAGNOSIS — L905 Scar conditions and fibrosis of skin: Secondary | ICD-10-CM | POA: Diagnosis not present

## 2024-03-23 ENCOUNTER — Other Ambulatory Visit

## 2024-03-23 DIAGNOSIS — M4312 Spondylolisthesis, cervical region: Secondary | ICD-10-CM | POA: Diagnosis not present

## 2024-03-23 DIAGNOSIS — M9901 Segmental and somatic dysfunction of cervical region: Secondary | ICD-10-CM | POA: Diagnosis not present

## 2024-03-23 DIAGNOSIS — M9902 Segmental and somatic dysfunction of thoracic region: Secondary | ICD-10-CM | POA: Diagnosis not present

## 2024-03-23 DIAGNOSIS — M50322 Other cervical disc degeneration at C5-C6 level: Secondary | ICD-10-CM | POA: Diagnosis not present

## 2024-03-24 ENCOUNTER — Other Ambulatory Visit: Payer: Self-pay | Admitting: Physician Assistant

## 2024-03-24 DIAGNOSIS — M9902 Segmental and somatic dysfunction of thoracic region: Secondary | ICD-10-CM | POA: Diagnosis not present

## 2024-03-24 DIAGNOSIS — M9901 Segmental and somatic dysfunction of cervical region: Secondary | ICD-10-CM | POA: Diagnosis not present

## 2024-03-24 DIAGNOSIS — M50322 Other cervical disc degeneration at C5-C6 level: Secondary | ICD-10-CM | POA: Diagnosis not present

## 2024-03-24 DIAGNOSIS — G4700 Insomnia, unspecified: Secondary | ICD-10-CM

## 2024-03-24 DIAGNOSIS — M4312 Spondylolisthesis, cervical region: Secondary | ICD-10-CM | POA: Diagnosis not present

## 2024-03-25 NOTE — Telephone Encounter (Signed)
 Last Visit: 12/12/23  Next Visit: 06/14/24  Last Filled: 01/29/24  Quantity: 30 w/ 1 refill

## 2024-04-06 DIAGNOSIS — M50322 Other cervical disc degeneration at C5-C6 level: Secondary | ICD-10-CM | POA: Diagnosis not present

## 2024-04-06 DIAGNOSIS — M9901 Segmental and somatic dysfunction of cervical region: Secondary | ICD-10-CM | POA: Diagnosis not present

## 2024-04-06 DIAGNOSIS — M4312 Spondylolisthesis, cervical region: Secondary | ICD-10-CM | POA: Diagnosis not present

## 2024-04-06 DIAGNOSIS — M9902 Segmental and somatic dysfunction of thoracic region: Secondary | ICD-10-CM | POA: Diagnosis not present

## 2024-04-08 ENCOUNTER — Other Ambulatory Visit

## 2024-04-08 DIAGNOSIS — R197 Diarrhea, unspecified: Secondary | ICD-10-CM | POA: Diagnosis not present

## 2024-04-16 ENCOUNTER — Ambulatory Visit: Payer: Self-pay | Admitting: Gastroenterology

## 2024-04-16 LAB — CALPROTECTIN: Calprotectin: 10 ug/g

## 2024-04-21 ENCOUNTER — Other Ambulatory Visit: Payer: Self-pay | Admitting: *Deleted

## 2024-04-21 MED ORDER — IPRATROPIUM BROMIDE 0.06 % NA SOLN: 2.0000 | Freq: Two times a day (BID) | NASAL | 1 refills | Status: DC

## 2024-05-11 DIAGNOSIS — L738 Other specified follicular disorders: Secondary | ICD-10-CM | POA: Diagnosis not present

## 2024-05-11 DIAGNOSIS — D3612 Benign neoplasm of peripheral nerves and autonomic nervous system, upper limb, including shoulder: Secondary | ICD-10-CM | POA: Diagnosis not present

## 2024-05-11 DIAGNOSIS — L309 Dermatitis, unspecified: Secondary | ICD-10-CM | POA: Diagnosis not present

## 2024-05-11 DIAGNOSIS — D485 Neoplasm of uncertain behavior of skin: Secondary | ICD-10-CM | POA: Diagnosis not present

## 2024-05-11 DIAGNOSIS — L814 Other melanin hyperpigmentation: Secondary | ICD-10-CM | POA: Diagnosis not present

## 2024-05-11 DIAGNOSIS — L570 Actinic keratosis: Secondary | ICD-10-CM | POA: Diagnosis not present

## 2024-05-26 ENCOUNTER — Ambulatory Visit: Admitting: Gastroenterology

## 2024-05-26 NOTE — Progress Notes (Deleted)
 HPI :  83 year old female history of malignant hyperthermia, hypothyroidism, hypertension, colon polyps, IBS, lymphocytic colitis and chronic abdominal bloat. She is known by Dr. Leigh. Last seen in office by [facility] PA-C 10/22/2023 for further evaluation regarding alternating diarrhea and constipation with chronic abdominal bloat. There was some concern for constipation with overflow diarrhea therefore an abdominal xray was done which showed a small amount of stool in the right colon, otherwise was normal. She was instructed to stop Imodium  and to take Miralax .    She presents today with the same symptoms. She did not stop Imodium .  If she does not take Imodium  she has full blown diarrhea. No bloody or black stools. She infrequently eats ice cream and uses lactose free milk. No recent antibiotics for at least the past 6 months. Ibgard and fiber supplements were ineffective. No N/V. No upper or lower abdominal pain. She has acid reflux if she brushes her teeth too soon after eating. Her weight fluctuate up or down 5lbs, no significant weight loss. Her most recent colonoscopy 09/2017 per [facility] which showed a 3mm rectal adenoma and colon biopsies were consistent with lymphocytic colitis, treated with Budesonide  and Cholestyramine  without improvement. Flex sig 09/2018 showed no evidence of lymphocytic colitis. She remains active, works as a financial controller for [employer], takes 2 Imodium  tabs before going to work. She drinks 1/2 bottle of wine most days, some days has one glass of wine and one day weekly drinks a full bottle of wine.    Flex sig 09/23/18 - no evidence of lymphocytic colitis on path   Celiac labs TTG IgA and IgG negative, total IgA normal   Colonoscopy 10/06/2017 - 3mm rectal adenoma, otherwise biopsies diffusely showed lymphocytic colitis   CT abdomen / pelvis 06/25/20 - IMPRESSION: 1. Subtle inflammatory changes are suggested adjacent to the decompressed left  colon, which would support a mild inflammatory or infectious colitis in the proper clinical setting. This portion of the bowel is not well assessed due to lack of distension. 2. No other evidence of an acute abnormality within the abdomen or pelvis. 3. Aortic atherosclerosis    83 year old female with a history of lymphocytic colitis per colonoscopy 09/2017 with persistent IBS, alternating constipation/diarrhea and abdominal bloat. Diarrhea somewhat controlled by taking Imodium  2 tabs once or twice daily most days which subsequently results in diarrhea. No rectal  bleeding. No abdominal pain.  -SIBO breath test to rule out small bowel bacteria overgrowth -Follow up with gyn to consider a pelvic sonogram to evaluate the ovaries secondary to chronic abdominal bloat -Reduce alcohol  intake, it is possible that her chronic GI symptoms are associated with alcohol  overuse. Patient encouraged to reduce alcohol  intake. -Imodium  one to two tabs po every day, stop if no BM in 24 hours  -Florastor probiotic one capsule po bid x 4 weeks  -Lactaid 1 to 2 tabs with each dairy product  -Consider CTAP and a diagnostic colonoscopy if symptoms persist or worsen  -Follow up with Dr. Leigh in 3 months, sooner if symptoms worsen    Alcohol  use disorder. Normal LFTs and platelet count. Normal liver per CTAP 12/201.  -Reduce alcohol  intake as noted above     Agree with assessment and plan as outlined. Colleen in addition to your recommendations we may consider doing fecal calprotectin just to make sure negative given persistent symptoms. Can you please add that if she is willing   \  Fecal calprotectin level of 10  Past Medical History:  Diagnosis Date   Arthritis    Asthma    Cancer (HCC)    basal cells   Colon polyps    Family history of adverse reaction to anesthesia    see malignant hyperthermia tab    GERD (gastroesophageal reflux disease)    Hypertension    Hypothyroidism    Malignant  hyperthermia    cousin's son- - malignant hyperthermia al most died  ,  patient's brother and his 2 sons tested positive for malignant hyperthermia   Palpitations    PONV (postoperative nausea and vomiting)    Spinal stenosis    Vitamin D  deficiency      Past Surgical History:  Procedure Laterality Date   AUGMENTATION MAMMAPLASTY Bilateral    bilateral arthroscopic knee surgery      bone spur removal      BREAST BIOPSY     CLAVICLE SURGERY Right    COLONOSCOPY     left ankle surgery - had pins      RESECTION DISTAL CLAVICAL Right 11/08/2021   thyroid  surgery - age 51      TOTAL KNEE ARTHROPLASTY Right 11/25/2017   Procedure: RIGHT TOTAL KNEE ARTHROPLASTY;  Surgeon: Ernie Cough, MD;  Location: [facility];  Service: Orthopedics;  Laterality: Right;  70 mins   TOTAL KNEE ARTHROPLASTY Left 04/16/2018   Procedure: LEFT TOTAL KNEE ARTHROPLASTY;  Surgeon: Ernie Cough, MD;  Location: [facility];  Service: Orthopedics;  Laterality: Left;  90 mins   TOTAL SHOULDER ARTHROPLASTY Left 02/10/2020   Procedure: TOTAL SHOULDER ARTHROPLASTY;  Surgeon: Melita Drivers, MD;  Location: [facility];  Service: Orthopedics;  Laterality: Left;    Family History  Problem Relation Age of Onset   Cancer Mother    Other Mother        tumors removed from intestines - uncertain etiology   Stroke Father    Pancreatic cancer Paternal Aunt    Hypertension Brother    Diabetes Brother    Heart disease Brother    Malignant hyperthermia Brother    Cancer Other    Pancreatic cancer Other    Malignant hyperthermia Other    Colon cancer Neg Hx    Esophageal cancer Neg Hx    Stomach cancer Neg Hx    Breast cancer Neg Hx    BRCA 1/2 Neg Hx    Social History   Tobacco Use   Smoking status: Former    Types: Cigarettes   Smokeless tobacco: Never   Tobacco comments:    age of 42 until age 31   Vaping Use   Vaping status: Never Used  Substance Use Topics   Alcohol  use: Yes    Comment: 1-3 glasses of wine per  night   Drug use: Never   Current Outpatient Medications  Medication Sig Dispense Refill   alendronate  (FOSAMAX ) 70 MG tablet TAKE 1 TABLET (70 MG TOTAL) BY MOUTH EVERY 7 DAYS WITH FULL GLASS WATER  ON EMPTY STOMACH 4 tablet 11   cholecalciferol (VITAMIN D3) 25 MCG (1000 UNIT) tablet Take 5,000 Units by mouth daily.     clobetasol ointment (TEMOVATE) 0.05 % Apply topically 2 (two) times daily.     estradiol (ESTRACE) 0.1 MG/GM vaginal cream Place 1 Applicatorful vaginally 2 (two) times a week.     hydrocortisone 2.5 % ointment Apply 20 g topically 2 (two) times daily.     ipratropium (ATROVENT ) 0.06 % nasal spray Place 2 sprays into both nostrils 2 (two) times daily.  15 mL 1   lisinopril -hydrochlorothiazide  (ZESTORETIC ) 10-12.5 MG tablet TAKE 1 TABLET BY MOUTH EVERY DAY 90 tablet 1   loperamide  (IMODIUM  A-D) 2 MG tablet Take 1 tablet (2 mg total) by mouth in the morning. Increase to twice a day if needed 30 tablet 0   Peppermint Oil (IBGARD) 90 MG CPCR prn     RESTASIS 0.05 % ophthalmic emulsion Place 1 drop into both eyes 2 (two) times daily.     triamcinolone  cream (KENALOG ) 0.1 % Apply 1 Application topically 2 (two) times daily. 30 g 0   zolpidem  (AMBIEN ) 10 MG tablet TAKE 1 TABLET BY MOUTH EVERYDAY AT BEDTIME 30 tablet 1   No current facility-administered medications for this visit.   Allergies  Allergen Reactions   Other     general anesthesia - pt's family has history of malignant hyperthermia   Oxycodone     Hallucinations    Penicillins     Childhood reaction Has patient had a PCN reaction causing immediate rash, facial/tongue/throat swelling, SOB or lightheadedness with hypotension: Unknown Has patient had a PCN reaction causing severe rash involving mucus membranes or skin necrosis: Unknown Has patient had a PCN reaction that required hospitalization: Unknown Has patient had a PCN reaction occurring within the last 10 years: No  Tolerated ancef  02/10/2020      Review of  Systems: All systems reviewed and negative except where noted in HPI.    No results found.  Physical Exam: There were no vitals taken for this visit. Constitutional: Pleasant,well-developed, ***female in no acute distress. HEENT: Normocephalic and atraumatic. Conjunctivae are normal. No scleral icterus. Neck supple.  Cardiovascular: Normal rate, regular rhythm.  Pulmonary/chest: Effort normal and breath sounds normal. No wheezing, rales or rhonchi. Abdominal: Soft, nondistended, nontender. Bowel sounds active throughout. There are no masses palpable. No hepatomegaly. Extremities: no edema Lymphadenopathy: No cervical adenopathy noted. Neurological: Alert and oriented to person place and time. Skin: Skin is warm and dry. No rashes noted. Psychiatric: Normal mood and affect. Behavior is normal.   ASSESSMENT: 83 y.o. female here for assessment of the following  No diagnosis found.  PLAN:   Job Lukes, [place]

## 2024-06-07 ENCOUNTER — Ambulatory Visit: Payer: Self-pay | Admitting: *Deleted

## 2024-06-07 NOTE — Telephone Encounter (Signed)
 FYI Only or Action Required?: FYI only for provider: appointment scheduled on 11/19.  Patient was last seen in primary care on 12/12/2023 by Job Lukes, PA.  Called Nurse Triage reporting Pruritis.  Symptoms began several months ago.  Interventions attempted: Nothing.  Symptoms are: unchanged.  Triage Disposition: See Physician Within 24 Hours  Patient/caregiver understands and will follow disposition?: Yes  Copied from CRM #[id number]. Topic: Clinical - Red Word Triage >> Jun 07, 2024 10:18 AM Robinson H wrote: [facility] that prompted transfer to Nurse Triage: Started new supplements trying to get off ibuprofen started some joint supplements and other pills to enhance sex life couple pills there. >> Jun 07, 2024 10:20 AM Robinson H wrote: Itching all over really bad to the point she's making herself bleed and bruising. Reason for Disposition  [1] MODERATE-SEVERE widespread itching (i.e., interferes with sleep, normal activities or school) AND [2] not improved after 24 hours of itching Care Advice  Answer Assessment - Initial Assessment Questions 1. DESCRIPTION: Describe the itching you are having.     Starts with itching that causes patient to scratch 2. SEVERITY: How bad is it?      Annoying - severe 3. SCRATCHING: Are there any scratch marks? Bleeding?     yes 4. ONSET: When did this begin? (e.g., minutes, hours, days ago)      3 months 5. CAUSE: What do you think is causing the itching? (ask about swimming pools, pollen, animals, soaps, etc.)     Possible reaction to supplements- nature pure, radiance , o plus positive, others   6. OTHER SYMPTOMS: Do you have any other symptoms? (e.g., fever, rash)     no  Protocols used: Itching - [employer]

## 2024-06-07 NOTE — Telephone Encounter (Signed)
 Noted

## 2024-06-09 ENCOUNTER — Ambulatory Visit (INDEPENDENT_AMBULATORY_CARE_PROVIDER_SITE_OTHER): Admitting: Physician Assistant

## 2024-06-09 ENCOUNTER — Encounter: Payer: Self-pay | Admitting: Physician Assistant

## 2024-06-09 VITALS — BP 122/80 | HR 95 | Temp 97.9°F | Ht 66.0 in | Wt 132.0 lb

## 2024-06-09 DIAGNOSIS — Z23 Encounter for immunization: Secondary | ICD-10-CM

## 2024-06-09 DIAGNOSIS — L299 Pruritus, unspecified: Secondary | ICD-10-CM

## 2024-06-09 DIAGNOSIS — G8929 Other chronic pain: Secondary | ICD-10-CM | POA: Diagnosis not present

## 2024-06-09 DIAGNOSIS — M546 Pain in thoracic spine: Secondary | ICD-10-CM

## 2024-06-09 DIAGNOSIS — S22020D Wedge compression fracture of second thoracic vertebra, subsequent encounter for fracture with routine healing: Secondary | ICD-10-CM | POA: Diagnosis not present

## 2024-06-09 LAB — COMPREHENSIVE METABOLIC PANEL WITH GFR
ALT: 21 U/L (ref 0–35)
AST: 23 U/L (ref 0–37)
Albumin: 4.5 g/dL (ref 3.5–5.2)
Alkaline Phosphatase: 59 U/L (ref 39–117)
BUN: 15 mg/dL (ref 6–23)
CO2: 29 meq/L (ref 19–32)
Calcium: 9.7 mg/dL (ref 8.4–10.5)
Chloride: 99 meq/L (ref 96–112)
Creatinine, Ser: 0.72 mg/dL (ref 0.40–1.20)
GFR: 77.88 mL/min (ref >60.00)
Glucose, Bld: 84 mg/dL (ref 70–99)
Potassium: 3.4 meq/L — ABNORMAL LOW (ref 3.5–5.1)
Sodium: 137 meq/L (ref 135–145)
Total Bilirubin: 1 mg/dL (ref 0.2–1.2)
Total Protein: 7.5 g/dL (ref 6.0–8.3)

## 2024-06-09 LAB — IBC + FERRITIN
Ferritin: 124.8 ng/mL (ref 10.0–291.0)
Iron: 118 ug/dL (ref 42–145)
Saturation Ratios: 26.7 % (ref 20.0–50.0)
TIBC: 442.4 ug/dL (ref 250.0–450.0)
Transferrin: 316 mg/dL (ref 212.0–360.0)

## 2024-06-09 LAB — CBC WITH DIFFERENTIAL/PLATELET
Basophils Absolute: 0 K/uL (ref 0.0–0.1)
Basophils Relative: 0.4 % (ref 0.0–3.0)
Eosinophils Absolute: 0.2 K/uL (ref 0.0–0.7)
Eosinophils Relative: 2.6 % (ref 0.0–5.0)
HCT: 41.5 % (ref 36.0–46.0)
Hemoglobin: 13.9 g/dL (ref 12.0–15.0)
Lymphocytes Relative: 18.7 % (ref 12.0–46.0)
Lymphs Abs: 1.2 K/uL (ref 0.7–4.0)
MCHC: 33.5 g/dL (ref 30.0–36.0)
MCV: 95.8 fl (ref 78.0–100.0)
Monocytes Absolute: 0.6 K/uL (ref 0.1–1.0)
Monocytes Relative: 9.3 % (ref 3.0–12.0)
Neutro Abs: 4.6 K/uL (ref 1.4–7.7)
Neutrophils Relative %: 69 % (ref 43.0–77.0)
Platelets: 311 K/uL (ref 150.0–400.0)
RBC: 4.33 Mil/uL (ref 3.87–5.11)
RDW: 12.9 % (ref 11.5–15.5)
WBC: 6.7 K/uL (ref 4.0–10.5)

## 2024-06-09 LAB — TSH: TSH: 0.51 u[IU]/mL (ref 0.35–5.50)

## 2024-06-09 MED ORDER — HYDROCORTISONE 2.5 % EX OINT: TOPICAL_OINTMENT | CUTANEOUS | 1 refills | Status: AC

## 2024-06-09 NOTE — Progress Notes (Signed)
 Emma Swanson is a 83 y.o. female here for a follow up of a pre-existing problem.  History of Present Illness:   Chief Complaint  Patient presents with   Itchng    Pt c/o itching all over off and on x several months. Has tried anti-itch cream no relief.    Discussed the use of AI scribe software for clinical note transcription with the patient, who gave verbal consent to proceed.  History of Present Illness   Emma Swanson is an 83 year old female who presents with a persistent rash and recent fall.  She has a widespread rash for the last couple of months on her legs, arms, and back. The rash is intensely pruritic, leading to scratching and bruising. She has not identified a specific cause despite reviewing her supplements and medications, which include Ambien , lisinopril , hydrochlorothiazide , and vitamin K. She has tried itch creams but struggles to apply them to her back. There is no pain associated with the rash.  On November 10, she fell at a diving trade show, tripping on a curb and landing on her knee, forehead, and nose. Since the fall, she has experienced back pain, particularly in the usual area of discomfort. Prior to the fall, she had tendon pain extending down her arm, exacerbated by work activities. For back pain, she takes a combination of four ibuprofen and two Tylenol  Extra Strength, but limits its use to when she is working.       Past Medical History:  Diagnosis Date   Arthritis    Asthma    Cancer (HCC)    basal cells   Colon polyps    Family history of adverse reaction to anesthesia    see malignant hyperthermia tab    GERD (gastroesophageal reflux disease)    Hypertension    Hypothyroidism    Malignant hyperthermia    cousin's son- - malignant hyperthermia al most died  ,  patient's brother and his 2 sons tested positive for malignant hyperthermia   Palpitations    PONV (postoperative nausea and vomiting)    Spinal stenosis    Vitamin D  deficiency       Social History   Tobacco Use   Smoking status: Former    Types: Cigarettes   Smokeless tobacco: Never   Tobacco comments:    age of 42 until age 55   Vaping Use   Vaping status: Never Used  Substance Use Topics   Alcohol  use: Yes    Comment: 1-3 glasses of wine per night   Drug use: Never    Past Surgical History:  Procedure Laterality Date   AUGMENTATION MAMMAPLASTY Bilateral    bilateral arthroscopic knee surgery      bone spur removal      BREAST BIOPSY     CLAVICLE SURGERY Right    COLONOSCOPY     left ankle surgery - had pins      RESECTION DISTAL CLAVICAL Right 11/08/2021   thyroid  surgery - age 81      TOTAL KNEE ARTHROPLASTY Right 11/25/2017   Procedure: RIGHT TOTAL KNEE ARTHROPLASTY;  Surgeon: Ernie Cough, MD;  Location: [facility];  Service: Orthopedics;  Laterality: Right;  70 mins   TOTAL KNEE ARTHROPLASTY Left 04/16/2018   Procedure: LEFT TOTAL KNEE ARTHROPLASTY;  Surgeon: Ernie Cough, MD;  Location: [facility];  Service: Orthopedics;  Laterality: Left;  90 mins   TOTAL SHOULDER ARTHROPLASTY Left 02/10/2020   Procedure: TOTAL SHOULDER ARTHROPLASTY;  Surgeon: Melita Drivers, MD;  Location: [facility];  Service: Orthopedics;  Laterality: Left;     Family History  Problem Relation Age of Onset   Cancer Mother    Other Mother        tumors removed from intestines - uncertain etiology   Stroke Father    Pancreatic cancer Paternal Aunt    Hypertension Brother    Diabetes Brother    Heart disease Brother    Malignant hyperthermia Brother    Cancer Other    Pancreatic cancer Other    Malignant hyperthermia Other    Colon cancer Neg Hx    Esophageal cancer Neg Hx    Stomach cancer Neg Hx    Breast cancer Neg Hx    BRCA 1/2 Neg Hx     Allergies  Allergen Reactions   Other     general anesthesia - pt's family has history of malignant hyperthermia   Oxycodone     Hallucinations    Penicillins     Childhood reaction Has patient had a PCN reaction  causing immediate rash, facial/tongue/throat swelling, SOB or lightheadedness with hypotension: Unknown Has patient had a PCN reaction causing severe rash involving mucus membranes or skin necrosis: Unknown Has patient had a PCN reaction that required hospitalization: Unknown Has patient had a PCN reaction occurring within the last 10 years: No  Tolerated ancef  02/10/2020     Current Medications:   Current Outpatient Medications:    alendronate  (FOSAMAX ) 70 MG tablet, TAKE 1 TABLET (70 MG TOTAL) BY MOUTH EVERY 7 DAYS WITH FULL GLASS WATER  ON EMPTY STOMACH, Disp: 4 tablet, Rfl: 11   cholecalciferol (VITAMIN D3) 25 MCG (1000 UNIT) tablet, Take 5,000 Units by mouth daily., Disp: , Rfl:    clobetasol ointment (TEMOVATE) 0.05 %, Apply topically 2 (two) times daily., Disp: , Rfl:    estradiol (ESTRACE) 0.1 MG/GM vaginal cream, Place 1 Applicatorful vaginally 2 (two) times a week., Disp: , Rfl:    ipratropium (ATROVENT ) 0.06 % nasal spray, Place 2 sprays into both nostrils 2 (two) times daily., Disp: 15 mL, Rfl: 1   lisinopril -hydrochlorothiazide  (ZESTORETIC ) 10-12.5 MG tablet, TAKE 1 TABLET BY MOUTH EVERY DAY, Disp: 90 tablet, Rfl: 1   loperamide  (IMODIUM  A-D) 2 MG tablet, Take 1 tablet (2 mg total) by mouth in the morning. Increase to twice a day if needed, Disp: 30 tablet, Rfl: 0   RESTASIS 0.05 % ophthalmic emulsion, Place 1 drop into both eyes 2 (two) times daily., Disp: , Rfl:    triamcinolone  cream (KENALOG ) 0.1 %, Apply 1 Application topically 2 (two) times daily., Disp: 30 g, Rfl: 0   zolpidem  (AMBIEN ) 10 MG tablet, TAKE 1 TABLET BY MOUTH EVERYDAY AT BEDTIME, Disp: 30 tablet, Rfl: 1   hydrocortisone 2.5 % ointment, Apply to affected area 1-2 times daily, Disp: 30 g, Rfl: 1   Review of Systems:   Negative unless otherwise specified per HPI.  Vitals:   Vitals:   06/09/24 1044  BP: 122/80  Pulse: 95  Temp: 97.9 F (36.6 C)  TempSrc: Temporal  SpO2: 98%  Weight: 132 lb (59.9 kg)   Height: 5' 6 (1.676 m)     Body mass index is 21.31 kg/m.  Physical Exam:   Physical Exam Vitals and nursing note reviewed.  Constitutional:      General: She is not in acute distress.    Appearance: She is well-developed. She is not ill-appearing or toxic-appearing.  Cardiovascular:     Rate and Rhythm: Normal rate and  regular rhythm.     Pulses: Normal pulses.     Heart sounds: Normal heart sounds, S1 normal and S2 normal.  Pulmonary:     Effort: Pulmonary effort is normal.     Breath sounds: Normal breath sounds.  Musculoskeletal:     Comments: No decreased ROM 2/2 pain with flexion/extension, lateral side bends, or rotation. Reproducible tenderness to superior aspect of thoracic spine.    Skin:    General: Skin is warm and dry.     Comments: Numerous erythematous papules and plaques on back, arms, legs  Neurological:     Mental Status: She is alert.     GCS: GCS eye subscore is 4. GCS verbal subscore is 5. GCS motor subscore is 6.  Psychiatric:        Speech: Speech normal.        Behavior: Behavior normal. Behavior is cooperative.     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 Differential includes allergic reaction or supplement-induced rash. No recent travel or bed bug exposure. Significant itching without pain. Steroids avoided due to bone loss risk. - Minimize supplements. - Use clobetasol ointment on itchy areas. - Take Claritin or Zyrtec daily for two weeks. - Ordered blood work to check liver labs and thyroid  function. - Consider dermatology referral for punch biopsy if no improvement.  Compression fracture of T2 vertebra Compression fracture at T2 vertebra, possibly acute or chronic. No acute intervention required. - Continue follow up with spine specialist on December 23rd. - Consider urgent care visit if symptoms worsen.  Chronic back pain Pain exacerbated by recent fall, localized  to usual areas. Managed with ibuprofen and Tylenol . Chiropractic care ongoing. No acute intervention required. - Continue current pain management regimen with ibuprofen and Tylenol  as needed. - Continue chiropractic care.       Lucie Buttner, PA-C

## 2024-06-10 ENCOUNTER — Ambulatory Visit: Payer: Self-pay | Admitting: Physician Assistant

## 2024-06-14 ENCOUNTER — Ambulatory Visit: Admitting: Physician Assistant

## 2024-07-02 DIAGNOSIS — M13841 Other specified arthritis, right hand: Secondary | ICD-10-CM | POA: Diagnosis not present

## 2024-07-02 DIAGNOSIS — M13842 Other specified arthritis, left hand: Secondary | ICD-10-CM | POA: Diagnosis not present

## 2024-07-12 ENCOUNTER — Ambulatory Visit: Admitting: Physician Assistant

## 2024-07-12 VITALS — BP 120/82 | HR 81 | Temp 97.3°F | Ht 66.0 in | Wt 131.4 lb

## 2024-07-12 DIAGNOSIS — M546 Pain in thoracic spine: Secondary | ICD-10-CM | POA: Diagnosis not present

## 2024-07-12 DIAGNOSIS — M810 Age-related osteoporosis without current pathological fracture: Secondary | ICD-10-CM

## 2024-07-12 DIAGNOSIS — I10 Essential (primary) hypertension: Secondary | ICD-10-CM

## 2024-07-12 DIAGNOSIS — G4700 Insomnia, unspecified: Secondary | ICD-10-CM | POA: Diagnosis not present

## 2024-07-12 MED ORDER — ZOLPIDEM TARTRATE 10 MG PO TABS: ORAL_TABLET | ORAL | 1 refills | Status: AC

## 2024-07-12 MED ORDER — LISINOPRIL-HYDROCHLOROTHIAZIDE 10-12.5 MG PO TABS: 1.0000 | ORAL_TABLET | Freq: Every day | ORAL | 1 refills | Status: AC

## 2024-07-12 NOTE — Progress Notes (Signed)
 "  History of Present Illness:   Chief Complaint  Patient presents with   Medical Management of Chronic Issues    Here for 6 month follow-up. Needs med refill. Did have a fall while in [place]. Went to ER. L5+L6 injury and two black eye. Nov 10, Care everywhere has info.     Discussed the use of AI scribe software for clinical note transcription with the patient, who gave verbal consent to proceed.  History of Present Illness   Emma Swanson is an 83 year old female who presents for follow-up after a recent fall and medication refills.  About 2-3 weeks ago she slipped on bathroom rugs at night and fell, causing a small elbow wound she describes as a hole in my elbow, without other major injury.  She has chronic back and hand pain but feels it does not limit activities outside of work. 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  She takes Ambien  10 mg daily and requests a refill. She takes weekly Fosamax  when able, uses a vaginal cream and supplements with good effect, and uses ibuprofen for pain.  Recent blood work showed mildly low potassium and she is increasing dietary potassium with bananas.  She is curerntly taking lisinopril -hydrochloroTHIAZIDE  10-12.5 mg daily. Blood pressure is well controlled  She is seeing gastroenterology soon for follow up. She will inquire about colorectal screening at that time.       Past Medical History:  Diagnosis Date   Arthritis    Asthma    Cancer (HCC)    basal cells   Colon polyps    Family history of adverse reaction to anesthesia    see malignant hyperthermia tab    GERD (gastroesophageal reflux disease)    Hypertension    Hypothyroidism    Malignant hyperthermia    cousin's son- - malignant hyperthermia al most died  ,  patient's brother and his 2 sons tested positive for malignant hyperthermia   Palpitations    PONV (postoperative nausea and vomiting)    Spinal stenosis     Vitamin D  deficiency      Social History[1]  Past Surgical History:  Procedure Laterality Date   AUGMENTATION MAMMAPLASTY Bilateral    bilateral arthroscopic knee surgery      bone spur removal      BREAST BIOPSY     CLAVICLE SURGERY Right    COLONOSCOPY     left ankle surgery - had pins      RESECTION DISTAL CLAVICAL Right 11/08/2021   thyroid  surgery - age 89      TOTAL KNEE ARTHROPLASTY Right 11/25/2017   Procedure: RIGHT TOTAL KNEE ARTHROPLASTY;  Surgeon: Ernie Cough, MD;  Location: [facility];  Service: Orthopedics;  Laterality: Right;  70 mins   TOTAL KNEE ARTHROPLASTY Left 04/16/2018   Procedure: LEFT TOTAL KNEE ARTHROPLASTY;  Surgeon: Ernie Cough, MD;  Location: [facility];  Service: Orthopedics;  Laterality: Left;  90 mins   TOTAL SHOULDER ARTHROPLASTY Left 02/10/2020   Procedure: TOTAL SHOULDER ARTHROPLASTY;  Surgeon: Melita Drivers, MD;  Location: [facility];  Service: Orthopedics;  Laterality: Left;     Family History  Problem Relation Age of Onset   Cancer Mother    Other Mother        tumors removed from intestines - uncertain etiology   Stroke Father    Pancreatic cancer Paternal Aunt    Hypertension Brother    Diabetes Brother  Heart disease Brother    Malignant hyperthermia Brother    Cancer Other    Pancreatic cancer Other    Malignant hyperthermia Other    Colon cancer Neg Hx    Esophageal cancer Neg Hx    Stomach cancer Neg Hx    Breast cancer Neg Hx    BRCA 1/2 Neg Hx     Allergies[2]  Current Medications:  Current Medications[3]   Review of Systems:   Negative unless otherwise specified per HPI.  Vitals:   Vitals:   07/12/24 1500  BP: 120/82  Pulse: 81  Temp: (!) 97.3 F (36.3 C)  TempSrc: Temporal  SpO2: 98%  Weight: 131 lb 6.4 oz (59.6 kg)  Height: 5' 6 (1.676 m)     Body mass index is 21.21 kg/m.  Physical Exam:   Physical Exam Vitals and nursing note reviewed.  Constitutional:      General: She is not in acute  distress.    Appearance: She is well-developed. She is not ill-appearing or toxic-appearing.  Cardiovascular:     Rate and Rhythm: Normal rate and regular rhythm.     Pulses: Normal pulses.     Heart sounds: Normal heart sounds, S1 normal and S2 normal.  Pulmonary:     Effort: Pulmonary effort is normal.     Breath sounds: Normal breath sounds.  Skin:    General: Skin is warm and dry.  Neurological:     Mental Status: She is alert.     GCS: GCS eye subscore is 4. GCS verbal subscore is 5. GCS motor subscore is 6.  Psychiatric:        Speech: Speech normal.        Behavior: Behavior normal. Behavior is cooperative.     Assessment and Plan:   Assessment and Plan    Insomnia Continued use of Ambien  for insomnia management. - Refilled Ambien  prescription for six months. - PDMP reviewed during this encounter.   Osteoporosis, unspecified osteoporosis type, unspecified pathological fracture presence  Difficulty adhering to weekly Fosamax  regimen due to schedule constraints. Continue efforts at compliance Having MRI soon with orthopedics to further evaluate recent back injury -- reports there is no major physical limitations at this time  Primary hypertension  Normotensive Continue lisinopril -hydrochloroTHIAZIDE  10-12.5 mg daily Follow up in 6 month(s)   General Health Maintenance Discussion about the need for a colonoscopy. - Discuss colonoscopy need with gastroenterologist during upcoming appointment.         Lucie Buttner, PA-C    [1]  Social History Tobacco Use   Smoking status: Former    Types: Cigarettes   Smokeless tobacco: Never   Tobacco comments:    age of 88 until age 89   Vaping Use   Vaping status: Never Used  Substance Use Topics   Alcohol  use: Yes    Comment: 1-3 glasses of wine per night   Drug use: Never  [2]  Allergies Allergen Reactions   Other     general anesthesia - pt's family has history of malignant hyperthermia   Oxycodone      Hallucinations    Penicillins     Childhood reaction Has patient had a PCN reaction causing immediate rash, facial/tongue/throat swelling, SOB or lightheadedness with hypotension: Unknown Has patient had a PCN reaction causing severe rash involving mucus membranes or skin necrosis: Unknown Has patient had a PCN reaction that required hospitalization: Unknown Has patient had a PCN reaction occurring within the last 10 years: No  Tolerated  ancef  02/10/2020   [3]  Current Outpatient Medications:    alendronate  (FOSAMAX ) 70 MG tablet, TAKE 1 TABLET (70 MG TOTAL) BY MOUTH EVERY 7 DAYS WITH FULL GLASS WATER  ON EMPTY STOMACH, Disp: 4 tablet, Rfl: 11   clobetasol ointment (TEMOVATE) 0.05 %, Apply topically 2 (two) times daily., Disp: , Rfl:    estradiol (ESTRACE) 0.1 MG/GM vaginal cream, Place 1 Applicatorful vaginally 2 (two) times a week., Disp: , Rfl:    hydrocortisone  2.5 % ointment, Apply to affected area 1-2 times daily (Patient taking differently: as needed. Apply to affected area 1-2 times daily), Disp: 30 g, Rfl: 1   ipratropium (ATROVENT ) 0.06 % nasal spray, Place 2 sprays into both nostrils 2 (two) times daily., Disp: 15 mL, Rfl: 1   lisinopril -hydrochlorothiazide  (ZESTORETIC ) 10-12.5 MG tablet, TAKE 1 TABLET BY MOUTH EVERY DAY, Disp: 90 tablet, Rfl: 1   loperamide  (IMODIUM  A-D) 2 MG tablet, Take 1 tablet (2 mg total) by mouth in the morning. Increase to twice a day if needed, Disp: 30 tablet, Rfl: 0   RESTASIS 0.05 % ophthalmic emulsion, Place 1 drop into both eyes 2 (two) times daily., Disp: , Rfl:    zolpidem  (AMBIEN ) 10 MG tablet, TAKE 1 TABLET BY MOUTH EVERYDAY AT BEDTIME, Disp: 30 tablet, Rfl: 1   cholecalciferol (VITAMIN D3) 25 MCG (1000 UNIT) tablet, Take 5,000 Units by mouth daily. (Patient not taking: Reported on 07/12/2024), Disp: , Rfl:   "

## 2024-07-13 ENCOUNTER — Other Ambulatory Visit: Payer: Self-pay | Admitting: Physician Assistant

## 2024-08-02 ENCOUNTER — Ambulatory Visit: Admitting: Gastroenterology

## 2024-08-05 ENCOUNTER — Other Ambulatory Visit: Payer: Self-pay

## 2024-08-05 ENCOUNTER — Encounter (HOSPITAL_BASED_OUTPATIENT_CLINIC_OR_DEPARTMENT_OTHER): Payer: Self-pay

## 2024-08-05 ENCOUNTER — Emergency Department (HOSPITAL_BASED_OUTPATIENT_CLINIC_OR_DEPARTMENT_OTHER)
Admission: EM | Admit: 2024-08-05 | Discharge: 2024-08-05 | Disposition: A | Discharge status: Home / Self Care | Attending: Emergency Medicine | Admitting: Emergency Medicine

## 2024-08-05 DIAGNOSIS — M7022 Olecranon bursitis, left elbow: Secondary | ICD-10-CM | POA: Insufficient documentation

## 2024-08-05 DIAGNOSIS — Y9389 Activity, other specified: Secondary | ICD-10-CM | POA: Insufficient documentation

## 2024-08-05 DIAGNOSIS — H5712 Ocular pain, left eye: Secondary | ICD-10-CM | POA: Insufficient documentation

## 2024-08-05 DIAGNOSIS — Z79899 Other long term (current) drug therapy: Secondary | ICD-10-CM | POA: Insufficient documentation

## 2024-08-05 DIAGNOSIS — I10 Essential (primary) hypertension: Secondary | ICD-10-CM | POA: Diagnosis not present

## 2024-08-05 MED ORDER — ERYTHROMYCIN 5 MG/GM OP OINT
TOPICAL_OINTMENT | Freq: Four times a day (QID) | OPHTHALMIC | Status: DC
  Filled 2024-08-05: qty 3.5

## 2024-08-05 MED ORDER — TETRACAINE HCL 0.5 % OP SOLN
1.0000 [drp] | Freq: Once | OPHTHALMIC | Status: DC
  Filled 2024-08-05: qty 4

## 2024-08-05 MED ORDER — FLUORESCEIN SODIUM 1 MG OP STRP
1.0000 | ORAL_STRIP | Freq: Once | OPHTHALMIC | Status: DC
  Filled 2024-08-05: qty 1

## 2024-08-05 NOTE — Discharge Instructions (Signed)
 Overall suspect he might have a mild conjunctivitis.  May be mostly from dry eye.  But will do antibiotic ointment.  Recommend that you do it 3-4 times a day for the next 3 to 5 days.  Use your Restasis as well.  Continue Tylenol  and ice for your bursitis.  Follow-up with orthopedics.  Please return for evaluation if symptoms worsen.

## 2024-08-05 NOTE — ED Triage Notes (Signed)
 Pt arrives with c/o left eye pain that started yesterday. Pt reports feeling like she had something in her eye. Pt also c/o left elbow pain from a fall that happened a couple of months. Pt does have redness and reports having drainage from elbow. Pt denies fevers.

## 2024-08-05 NOTE — ED Provider Notes (Signed)
 " Emma Swanson   CSN: [id number] Arrival date & time: 08/05/24  1545     Patient presents with: Eye Pain and Joint Swelling   Emma Swanson is a 84 y.o. female.   Patient mostly here for left eye discomfort.  She feels the dryness in her left eye since last night feels little bit better this morning.  She feels like something might be in it.  She flushed it out but did not help much.  She also was curious of her left elbow.  Her chiropractor told her that she had bursitis in her left elbow.  She had a fall here couple weeks ago.  She had big swelling to her left elbow that is mostly resolved.  She had some drainage from it earlier in the process but that has resolved.  Still has a little bit of swelling and redness but no pain.  She denies any fever.  She has not seen orthopedic about this.  She has a history of hypertension.  She denies any trauma to the eye.  She denies any drainage.  The history is provided by the patient.       Prior to Admission medications  Medication Sig Start Date End Date Taking? Authorizing Provider  alendronate  (FOSAMAX ) 70 MG tablet TAKE 1 TABLET (70 MG TOTAL) BY MOUTH EVERY 7 DAYS WITH FULL GLASS WATER  ON EMPTY STOMACH 09/22/23   Emma Lukes, PA  cholecalciferol (VITAMIN D3) 25 MCG (1000 UNIT) tablet Take 5,000 Units by mouth daily. Patient not taking: Reported on 07/12/2024    [provider]  clobetasol ointment (TEMOVATE) 0.05 % Apply topically 2 (two) times daily. 02/21/22   [provider]  estradiol (ESTRACE) 0.1 MG/GM vaginal cream Place 1 Applicatorful vaginally 2 (two) times a week.    [provider]  hydrocortisone  2.5 % ointment Apply to affected area 1-2 times daily Patient taking differently: as needed. Apply to affected area 1-2 times daily 06/09/24   Emma Lukes, PA  ipratropium (ATROVENT ) 0.06 % nasal spray PLACE 2 SPRAYS INTO BOTH NOSTRILS 2 (TWO) TIMES  DAILY 07/13/24   Emma Lukes, PA  lisinopril -hydrochlorothiazide  (ZESTORETIC ) 10-12.5 MG tablet Take 1 tablet by mouth daily. 07/12/24   Emma Lukes, PA  loperamide  (IMODIUM  A-D) 2 MG tablet Take 1 tablet (2 mg total) by mouth in the morning. Increase to twice a day if needed 06/28/21   Armbruster, Elspeth SQUIBB, MD  RESTASIS 0.05 % ophthalmic emulsion Place 1 drop into both eyes 2 (two) times daily.    [provider]  zolpidem  (AMBIEN ) 10 MG tablet TAKE 1 TABLET BY MOUTH EVERYDAY AT BEDTIME 07/12/24   Emma Swanson, Emma Swanson    Allergies: Other, Oxycodone, and Penicillins    Review of Systems  Updated Vital Signs BP 124/73   Pulse 79   Temp 97.8 F (36.6 C) (Oral)   Resp 15   Wt 59.4 kg   SpO2 100%   BMI 21.14 kg/m   Physical Exam Vitals and nursing Swanson reviewed.  Constitutional:      General: She is not in acute distress.    Appearance: She is well-developed. She is not ill-appearing.  HENT:     Head: Normocephalic and atraumatic.     Nose: Nose normal.     Mouth/Throat:     Mouth: Mucous membranes are moist.  Eyes:     Extraocular Movements: Extraocular movements intact.     Conjunctiva/sclera: Conjunctivae normal.  Pupils: Pupils are equal, round, and reactive to light.     Comments: Pupils are equal and reactive, negative fluorescein  uptake in the left eye, normal eye pressure in the eyes as well as no sign of foreign body, no signs of conjunctivitis there is no drainage and overall eye looks well extraocular movements are intact, 2040 vision in each eye  Cardiovascular:     Rate and Rhythm: Normal rate and regular rhythm.     Pulses: Normal pulses.     Heart sounds: Normal heart sounds. No murmur heard. Pulmonary:     Effort: Pulmonary effort is normal. No respiratory distress.     Breath sounds: Normal breath sounds.  Abdominal:     General: Abdomen is flat.     Palpations: Abdomen is soft.     Tenderness: There is no abdominal tenderness.   Musculoskeletal:        General: No swelling or tenderness.     Cervical back: Normal range of motion and neck supple.     Comments: Some mild swelling to the left olecranon bursa but no drainage no pain at range of movement of the left elbow  Skin:    General: Skin is warm and dry.     Capillary Refill: Capillary refill takes less than 2 seconds.  Neurological:     General: No focal deficit present.     Mental Status: She is alert and oriented to person, place, and time.     Cranial Nerves: No cranial nerve deficit.     Sensory: No sensory deficit.     Motor: No weakness.     Coordination: Coordination normal.  Psychiatric:        Mood and Affect: Mood normal.     (all labs ordered are listed, but only abnormal results are displayed) Labs Reviewed - No data to display  EKG: None  Radiology: No results found.   Procedures   Medications Ordered in the ED  tetracaine  (PONTOCAINE) 0.5 % ophthalmic solution 1 drop (has no administration in time range)  fluorescein  ophthalmic strip 1 strip (has no administration in time range)  erythromycin  ophthalmic ointment (has no administration in time range)                                    Medical Decision Making Risk Prescription drug management.   Luddie Boghosian is here for 2 issues.  She wants to be evaluated for ongoing bursitis in her left elbow.  She fell couple weeks ago had swelling to the left elbow.  Her chiropractor told her that it was a bursitis and I agree with this as well.  She states that is greatly improved.  She has very minimal swelling at this time.  I do not see any concern for any infection.  It drained a little bit earlier in this process but currently it looks for the most part resolved/resolving.  I have no concern for fracture or malalignment.  She is able to flex and extend without any pain.  Will have her continue Tylenol  rest ice maybe some topical bacitracin or Neosporin as well.  Ultimately her main  concern is her left eye discomfort.  Her left eye exam is pretty unremarkable.  She has normal eye pressure.  Normal extraocular movements.  Maybe a little bit of irritation of the conjunctiva but I do not see any uptake on fluorescein  testing.  I do not  have any major concern for uveitis iritis.  Is no hyphema.  I do think that this is likely some allergic conjunctivitis but will have her continue Restasis and start her on some erythromycin  eye ointment and have her follow-up with ophthalmology.  She will follow-up orthopedics regarding her elbow as well.  Discharged in good condition.  I have no concern for glaucoma or other acute process.  Discharge.  Understands return precautions.  This chart was dictated using voice recognition software.  Despite best efforts to proofread,  errors can occur which can change the documentation meaning.      Final diagnoses:  Olecranon bursitis of left elbow  Left eye pain    ED Discharge Orders     None          Emma Cornet, DO 08/05/24 1635  "

## 2024-08-27 ENCOUNTER — Other Ambulatory Visit: Payer: Self-pay | Admitting: Physician Assistant

## 2024-09-22 ENCOUNTER — Ambulatory Visit: Admitting: Gastroenterology

## 2025-01-17 ENCOUNTER — Ambulatory Visit: Admitting: Physician Assistant
